# Patient Record
Sex: Male | Born: 1945 | Race: White | Hispanic: No | Marital: Married | Smoking: Never smoker
Health system: Southern US, Community
[De-identification: ages and names within clinical notes are randomized; demographics above are authoritative.]

## PROBLEM LIST (undated history)

## (undated) DIAGNOSIS — I739 Peripheral vascular disease, unspecified: Secondary | ICD-10-CM

## (undated) DIAGNOSIS — R9430 Abnormal result of cardiovascular function study, unspecified: Secondary | ICD-10-CM

## (undated) DIAGNOSIS — I619 Nontraumatic intracerebral hemorrhage, unspecified: Secondary | ICD-10-CM

## (undated) DIAGNOSIS — I2510 Atherosclerotic heart disease of native coronary artery without angina pectoris: Secondary | ICD-10-CM

## (undated) DIAGNOSIS — G459 Transient cerebral ischemic attack, unspecified: Secondary | ICD-10-CM

## (undated) DIAGNOSIS — K219 Gastro-esophageal reflux disease without esophagitis: Secondary | ICD-10-CM

## (undated) DIAGNOSIS — Z951 Presence of aortocoronary bypass graft: Secondary | ICD-10-CM

## (undated) DIAGNOSIS — I10 Essential (primary) hypertension: Secondary | ICD-10-CM

## (undated) DIAGNOSIS — I779 Disorder of arteries and arterioles, unspecified: Secondary | ICD-10-CM

## (undated) DIAGNOSIS — E7800 Pure hypercholesterolemia, unspecified: Secondary | ICD-10-CM

## (undated) HISTORY — DX: Presence of aortocoronary bypass graft: Z95.1

## (undated) HISTORY — DX: Peripheral vascular disease, unspecified: I73.9

## (undated) HISTORY — DX: Pure hypercholesterolemia, unspecified: E78.00

## (undated) HISTORY — DX: Nontraumatic intracerebral hemorrhage, unspecified: I61.9

## (undated) HISTORY — DX: Abnormal result of cardiovascular function study, unspecified: R94.30

## (undated) HISTORY — DX: Essential (primary) hypertension: I10

## (undated) HISTORY — DX: Atherosclerotic heart disease of native coronary artery without angina pectoris: I25.10

## (undated) HISTORY — DX: Transient cerebral ischemic attack, unspecified: G45.9

## (undated) HISTORY — DX: Disorder of arteries and arterioles, unspecified: I77.9

## (undated) HISTORY — DX: Gastro-esophageal reflux disease without esophagitis: K21.9

---

## 1998-12-03 HISTORY — PX: CORONARY ARTERY BYPASS GRAFT: SHX141

## 1999-03-10 ENCOUNTER — Encounter: Payer: Self-pay | Admitting: Emergency Medicine

## 1999-03-10 ENCOUNTER — Inpatient Hospital Stay (HOSPITAL_COMMUNITY): Admission: EM | Admit: 1999-03-10 | Discharge: 1999-03-13 | Payer: Self-pay | Admitting: Emergency Medicine

## 1999-04-19 ENCOUNTER — Encounter: Payer: Self-pay | Admitting: Surgery

## 1999-04-21 ENCOUNTER — Inpatient Hospital Stay (HOSPITAL_COMMUNITY): Admission: RE | Admit: 1999-04-21 | Discharge: 1999-04-25 | Payer: Self-pay | Admitting: Surgery

## 1999-04-21 ENCOUNTER — Encounter: Payer: Self-pay | Admitting: Surgery

## 1999-04-22 ENCOUNTER — Encounter: Payer: Self-pay | Admitting: Surgery

## 1999-04-23 ENCOUNTER — Encounter: Payer: Self-pay | Admitting: Surgery

## 1999-05-30 ENCOUNTER — Encounter (HOSPITAL_COMMUNITY): Admission: RE | Admit: 1999-05-30 | Discharge: 1999-08-28 | Payer: Self-pay | Admitting: Cardiology

## 2002-02-08 ENCOUNTER — Inpatient Hospital Stay (HOSPITAL_COMMUNITY): Admission: EM | Admit: 2002-02-08 | Discharge: 2002-02-09 | Payer: Self-pay | Admitting: Emergency Medicine

## 2002-02-08 ENCOUNTER — Encounter: Payer: Self-pay | Admitting: Emergency Medicine

## 2002-02-08 ENCOUNTER — Encounter (INDEPENDENT_AMBULATORY_CARE_PROVIDER_SITE_OTHER): Payer: Self-pay | Admitting: *Deleted

## 2003-05-18 ENCOUNTER — Encounter: Payer: Self-pay | Admitting: Cardiology

## 2003-05-18 ENCOUNTER — Encounter: Payer: Self-pay | Admitting: Emergency Medicine

## 2003-05-18 ENCOUNTER — Encounter: Payer: Self-pay | Admitting: Neurology

## 2003-05-18 ENCOUNTER — Inpatient Hospital Stay (HOSPITAL_COMMUNITY): Admission: EM | Admit: 2003-05-18 | Discharge: 2003-05-21 | Payer: Self-pay

## 2003-05-20 ENCOUNTER — Encounter: Payer: Self-pay | Admitting: Neurology

## 2003-06-10 ENCOUNTER — Encounter: Admission: RE | Admit: 2003-06-10 | Discharge: 2003-06-21 | Payer: Self-pay | Admitting: Neurology

## 2004-12-03 DIAGNOSIS — G459 Transient cerebral ischemic attack, unspecified: Secondary | ICD-10-CM

## 2004-12-03 HISTORY — DX: Transient cerebral ischemic attack, unspecified: G45.9

## 2005-02-07 ENCOUNTER — Encounter: Admission: RE | Admit: 2005-02-07 | Discharge: 2005-02-07 | Payer: Self-pay | Admitting: Family Medicine

## 2005-07-11 ENCOUNTER — Inpatient Hospital Stay (HOSPITAL_COMMUNITY): Admission: EM | Admit: 2005-07-11 | Discharge: 2005-07-13 | Payer: Self-pay | Admitting: Emergency Medicine

## 2005-07-12 ENCOUNTER — Ambulatory Visit: Payer: Self-pay | Admitting: Cardiology

## 2005-07-12 ENCOUNTER — Encounter: Payer: Self-pay | Admitting: Cardiology

## 2005-07-18 ENCOUNTER — Encounter: Admission: RE | Admit: 2005-07-18 | Discharge: 2005-10-16 | Payer: Self-pay | Admitting: Neurology

## 2005-07-26 ENCOUNTER — Ambulatory Visit: Payer: Self-pay

## 2005-11-29 ENCOUNTER — Emergency Department (HOSPITAL_COMMUNITY): Admission: EM | Admit: 2005-11-29 | Discharge: 2005-11-29 | Payer: Self-pay | Admitting: Family Medicine

## 2006-11-20 ENCOUNTER — Encounter: Admission: RE | Admit: 2006-11-20 | Discharge: 2006-11-20 | Payer: Self-pay | Admitting: Family Medicine

## 2007-06-23 ENCOUNTER — Encounter: Payer: Self-pay | Admitting: Urology

## 2007-06-23 ENCOUNTER — Ambulatory Visit (HOSPITAL_BASED_OUTPATIENT_CLINIC_OR_DEPARTMENT_OTHER): Admission: RE | Admit: 2007-06-23 | Discharge: 2007-06-23 | Payer: Self-pay | Admitting: Urology

## 2007-10-02 ENCOUNTER — Ambulatory Visit: Payer: Self-pay | Admitting: Cardiology

## 2007-10-02 ENCOUNTER — Inpatient Hospital Stay (HOSPITAL_COMMUNITY): Admission: EM | Admit: 2007-10-02 | Discharge: 2007-10-05 | Payer: Self-pay | Admitting: Emergency Medicine

## 2007-10-27 ENCOUNTER — Ambulatory Visit: Payer: Self-pay | Admitting: Cardiology

## 2007-10-28 ENCOUNTER — Encounter (HOSPITAL_COMMUNITY): Admission: RE | Admit: 2007-10-28 | Discharge: 2007-12-03 | Payer: Self-pay | Admitting: Cardiology

## 2007-12-09 ENCOUNTER — Encounter (HOSPITAL_COMMUNITY): Admission: RE | Admit: 2007-12-09 | Discharge: 2007-12-19 | Payer: Self-pay | Admitting: Cardiology

## 2007-12-11 ENCOUNTER — Ambulatory Visit: Payer: Self-pay | Admitting: Cardiology

## 2008-01-14 ENCOUNTER — Ambulatory Visit: Payer: Self-pay | Admitting: Cardiology

## 2008-07-28 ENCOUNTER — Ambulatory Visit: Payer: Self-pay | Admitting: Cardiology

## 2008-10-12 ENCOUNTER — Ambulatory Visit: Payer: Self-pay | Admitting: Cardiology

## 2008-10-12 LAB — CONVERTED CEMR LAB
BUN: 16 mg/dL (ref 6–23)
Basophils Absolute: 0.1 10*3/uL (ref 0.0–0.1)
Basophils Relative: 0.9 % (ref 0.0–3.0)
CO2: 28 meq/L (ref 19–32)
Calcium: 9.4 mg/dL (ref 8.4–10.5)
Chloride: 107 meq/L (ref 96–112)
Creatinine, Ser: 1.2 mg/dL (ref 0.4–1.5)
Eosinophils Absolute: 0.2 10*3/uL (ref 0.0–0.7)
Eosinophils Relative: 2.2 % (ref 0.0–5.0)
GFR calc Af Amer: 79 mL/min
GFR calc non Af Amer: 65 mL/min
Glucose, Bld: 85 mg/dL (ref 70–99)
HCT: 47.4 % (ref 39.0–52.0)
Hemoglobin: 16.7 g/dL (ref 13.0–17.0)
INR: 1 (ref 0.8–1.0)
Lymphocytes Relative: 20.6 % (ref 12.0–46.0)
MCHC: 35.3 g/dL (ref 30.0–36.0)
MCV: 90.6 fL (ref 78.0–100.0)
Monocytes Absolute: 0.9 10*3/uL (ref 0.1–1.0)
Monocytes Relative: 11.4 % (ref 3.0–12.0)
Neutro Abs: 5.2 10*3/uL (ref 1.4–7.7)
Neutrophils Relative %: 64.9 % (ref 43.0–77.0)
Platelets: 213 10*3/uL (ref 150–400)
Potassium: 4.2 meq/L (ref 3.5–5.1)
Prothrombin Time: 12.2 s (ref 10.9–13.3)
RBC: 5.23 M/uL (ref 4.22–5.81)
RDW: 12.8 % (ref 11.5–14.6)
Sodium: 142 meq/L (ref 135–145)
WBC: 8 10*3/uL (ref 4.5–10.5)
aPTT: 25.7 s (ref 21.7–29.8)

## 2008-10-14 ENCOUNTER — Inpatient Hospital Stay (HOSPITAL_BASED_OUTPATIENT_CLINIC_OR_DEPARTMENT_OTHER): Admission: RE | Admit: 2008-10-14 | Discharge: 2008-10-14 | Payer: Self-pay | Admitting: Cardiology

## 2008-10-14 ENCOUNTER — Ambulatory Visit: Payer: Self-pay | Admitting: Cardiology

## 2008-10-25 ENCOUNTER — Ambulatory Visit: Payer: Self-pay | Admitting: Cardiology

## 2008-11-11 ENCOUNTER — Ambulatory Visit: Payer: Self-pay

## 2008-11-29 ENCOUNTER — Encounter: Admission: RE | Admit: 2008-11-29 | Discharge: 2008-11-29 | Payer: Self-pay | Admitting: Family Medicine

## 2009-01-18 ENCOUNTER — Ambulatory Visit: Payer: Self-pay | Admitting: Cardiology

## 2009-01-18 LAB — CONVERTED CEMR LAB
ALT: 25 units/L (ref 0–53)
AST: 22 units/L (ref 0–37)
Albumin: 3.7 g/dL (ref 3.5–5.2)
Alkaline Phosphatase: 61 units/L (ref 39–117)
Bilirubin, Direct: 0.1 mg/dL (ref 0.0–0.3)
Cholesterol: 204 mg/dL (ref 0–200)
Direct LDL: 121.3 mg/dL
HDL: 59.1 mg/dL (ref >39.0)
Total Bilirubin: 0.9 mg/dL (ref 0.3–1.2)
Total CHOL/HDL Ratio: 3.5
Total Protein: 6.7 g/dL (ref 6.0–8.3)
Triglycerides: 109 mg/dL (ref 0–149)
VLDL: 22 mg/dL (ref 0–40)

## 2009-05-30 ENCOUNTER — Encounter: Payer: Self-pay | Admitting: Cardiology

## 2009-07-25 ENCOUNTER — Encounter: Payer: Self-pay | Admitting: Cardiology

## 2009-07-26 ENCOUNTER — Ambulatory Visit: Payer: Self-pay | Admitting: Cardiology

## 2009-11-28 ENCOUNTER — Ambulatory Visit: Payer: Self-pay | Admitting: Cardiology

## 2010-01-19 ENCOUNTER — Encounter: Payer: Self-pay | Admitting: Cardiology

## 2010-01-20 ENCOUNTER — Ambulatory Visit: Payer: Self-pay | Admitting: Cardiology

## 2010-05-29 ENCOUNTER — Encounter: Payer: Self-pay | Admitting: Cardiology

## 2010-07-13 ENCOUNTER — Ambulatory Visit: Payer: Self-pay | Admitting: Cardiology

## 2011-01-04 NOTE — Miscellaneous (Signed)
  Clinical Lists Changes  Observations: Added new observation of PAST MED HX: CABG.. 2000../....Marland KitchenMarland KitchenAcute lateral MI with bare-metal stent  circumflex..(10/08)./....Marland KitchenCath 10/14/08.... LIMA to the LAD patent.[name] all vein grafts patent.. OM stent patent....some sluggish distal flow.. Medical therapy  CAD Hypertension. Hypercholesterolemia.zetia to be held 1123 2009 and simvastatin started October 25, 2008 right thalamic intracerebral bleed in 2004.  Despite this, he has tolerated aspirin and Plavix with no difficulty. History of a transient ischemic attack in 2006. GERD LV  low normal..Marland KitchenInferior hypokinesis ..some posterolateral hypokinesis. Carotid artery disease.... Doppler... December, 2010... 0-39% bilateral    (01/19/2010 15:25) Added new observation of PRIMARY MD: Elsworth Soho, MD (01/19/2010 15:25)       Past History:  Past Medical History: CABG.. 2000../....Marland KitchenMarland KitchenAcute lateral MI with bare-metal stent  circumflex..(10/08)./....Marland KitchenCath 10/14/08.... LIMA to the LAD patent.[name] all vein grafts patent.. OM stent patent....some sluggish distal flow.. Medical therapy  CAD Hypertension. Hypercholesterolemia.zetia to be held 1123 2009 and simvastatin started October 25, 2008 right thalamic intracerebral bleed in 2004.  Despite this, he has tolerated aspirin and Plavix with no difficulty. History of a transient ischemic attack in 2006. GERD LV  low normal..Marland KitchenInferior hypokinesis ..some posterolateral hypokinesis. Carotid artery disease.... Doppler... December, 2010... 0-39% bilateral

## 2011-01-04 NOTE — Assessment & Plan Note (Signed)
Summary: PER CHECK OUT/SF   Visit Type:  Follow-up Primary Provider:  L. Lupe Carney, MD  CC:  CAD.  History of Present Illness: Patient is seen for followup of coronary disease.  I saw him last August, 2010.  Is not having any chest pain.  He has not had syncope or presyncope.  He has a new problem today with a recent upper respiratory infection.  He continues to cough with this.  There is mild sputum production.  Current Medications (verified): 1)  Simvastatin 40 Mg Tabs (Simvastatin) .... Take One Tablet By Mouth Daily At Bedtime 2)  Coreg 3.125 Mg Tabs (Carvedilol) .... Take 1 Tablet By Mouth Twice A Day 3)  Hydrochlorothiazide 12.5 Mg Tabs (Hydrochlorothiazide) .... Take 1 Tablet By Mouth Once A  Day 4)  Omeprazole 20 Mg Tbec (Omeprazole) .... Take 1 Capsule By Mouth Once A Day 5)  Plavix 75 Mg Tabs (Clopidogrel Bisulfate) .... Take 1 Tablet By Mouth Once A Day 6)  Diazepam 5 Mg Tabs (Diazepam) .... As Needed 7)  Losartan Potassium 50 Mg Tabs (Losartan Potassium) .... Take One Tablet By Mouth Once Daily. 8)  Aspirin 81 Mg Tbec (Aspirin) .... Take One Tablet By Mouth Daily  Allergies (verified): 1)  ! Morphine  Past History:  Past Medical History: Last updated: 01/19/2010 CABG.. 2000../....Marland KitchenMarland KitchenAcute lateral MI with bare-metal stent  circumflex..(10/08)./....Marland KitchenCath 10/14/08.... LIMA to the LAD patent.[name] all vein grafts patent.. OM stent patent....some sluggish distal flow.. Medical therapy  CAD Hypertension. Hypercholesterolemia.zetia to be held 1123 2009 and simvastatin started October 25, 2008 right thalamic intracerebral bleed in 2004.  Despite this, he has tolerated aspirin and Plavix with no difficulty. History of a transient ischemic attack in 2006. GERD LV  low normal..Marland KitchenInferior hypokinesis ..some posterolateral hypokinesis. Carotid artery disease.... Doppler... December, 2010... 0-39% bilateral    Review of Systems       Patient denies fever, chills, sweats.  He  has had some mild headache with nasal congestion.[name]  He's had no change in hearing or vision.  There is no chest pain.  He does have a cough.  He has no nausea vomiting or urinary symptoms.  All other systems are reviewed and are negative.  Vital Signs:  Patient profile:   65 year old male Height:      68 inches Weight:      189 pounds BMI:     28.84 Pulse rate:   75 / minute BP sitting:   102 / 68  (left arm) Cuff size:   regular  Vitals Entered By: Hardin Negus, RMA (January 20, 2010 8:31 AM)  Physical Exam  General:  patient is stable in general but upper respiratory congestion. Head:  head is atraumatic Eyes:  no xanthelasma. Neck:  no jugular venous distention. Chest Wall:  no chest wall tenderness. Lungs:  lungs reveal some scattered rhonchi. Heart:  cardiac exam reveals an S1-S2.  No clicks or significant murmurs. Abdomen:  abdomen is soft. Msk:  no musculoskeletal deformities. Extremities:  no peripheral edema. Skin:  no skin rashes. Psych:  patient is oriented to person time and place.  Affect is normal.   Impression & Recommendations:  Problem # 1:  * UPPER RESPIRATORY INFECTION This is a new problem today.  I will recommend to him that he use Robitussin with Mucinex and Claritin.  I do feel that a Z-Pak also would be helpful.  Problem # 2:  CAROTID ARTERY DISEASE (ICD-433.10)  His updated medication list for this problem includes:  Plavix 75 Mg Tabs (Clopidogrel bisulfate) .[name]... Take 1 tablet by mouth once a day    Aspirin 81 Mg Tbec (Aspirin) .[name]... Take one tablet by mouth daily Patient has carotid artery disease.  He had a followup Doppler in December, 2010.  This showed mild bilateral disease that is stable.  Followup in the future.  Problem # 3:  HYPERTENSION (ICD-401.9)  His updated medication list for this problem includes:    Coreg 3.125 Mg Tabs (Carvedilol) .[name]... Take 1 tablet by mouth twice a day    Hydrochlorothiazide 12.5 Mg Tabs  (Hydrochlorothiazide) .[name]... Take 1 tablet by mouth once a  day    Losartan Potassium 50 Mg Tabs (Losartan potassium) .[name]... Take one tablet by mouth once daily.    Aspirin 81 Mg Tbec (Aspirin) .[name]... Take one tablet by mouth daily Blood pressure is under good control today.  No change in therapy.  Problem # 4:  CAD (ICD-414.00)  His updated medication list for this problem includes:    Coreg 3.125 Mg Tabs (Carvedilol) .[name]... Take 1 tablet by mouth twice a day    Plavix 75 Mg Tabs (Clopidogrel bisulfate) .[name]... Take 1 tablet by mouth once a day    Aspirin 81 Mg Tbec (Aspirin) .[name]... Take one tablet by mouth daily Coronary disease is stable at this time.  No change in therapy.  I'll see him back in 6 months.  Patient Instructions: 1)  We have sent you in a prescription for a z-pack (antibiotic), we also recommend that you use Robitussin cough medicine along with plain mucinex and plain claritin, avoid the decongestants 2)  Follow up in 6 months Prescriptions: ZITHROMAX 1 GM PACK (AZITHROMYCIN) Take as directed  #1 pack x 0   Entered by:   Meredith Staggers, RN   Authorized by:   Talitha Givens, MD, [facility]   Signed by:   Meredith Staggers, RN on 01/20/2010   Method used:   Electronically to        [facility] [address] [phone]* (retail)       [address]       [place], [place]  [address]       Ph: [phone] or [phone]       Fax: [phone]   RxID:   [id number]

## 2011-01-04 NOTE — Assessment & Plan Note (Signed)
Summary: Russell Hebert   Visit Type:  Follow-up Primary Provider:  L. Lupe Carney, MD  CC:  CAD.  History of Present Illness: The patient is seen for followup of coronary disease.  He is doing well.  He is not having chest pain or shortness of breath.  He is on appropriate medications for his coronary disease.  I saw him last February, 2011.  Current Medications (verified): 1)  Simvastatin 40 Mg Tabs (Simvastatin) .... Take One Tablet By Mouth Daily At Bedtime 2)  Coreg 3.125 Mg Tabs (Carvedilol) .... Take 1 Tablet By Mouth Twice A Day 3)  Hydrochlorothiazide 12.5 Mg Tabs (Hydrochlorothiazide) .... Take 1 Tablet By Mouth Once A  Day 4)  Omeprazole 20 Mg Tbec (Omeprazole) .... Take 1 Capsule By Mouth Once A Day 5)  Plavix 75 Mg Tabs (Clopidogrel Bisulfate) .... Take 1 Tablet By Mouth Once A Day 6)  Diazepam 5 Mg Tabs (Diazepam) .... As Needed 7)  Losartan Potassium 50 Mg Tabs (Losartan Potassium) .... Take One Tablet By Mouth Once Daily. 8)  Aspirin 81 Mg Tbec (Aspirin) .... Take One Tablet By Mouth Daily  Allergies: 1)  ! Morphine  Past History:  Past Medical History: CABG.. 2000../....Russell KitchenMarland KitchenAcute lateral MI with bare-metal stent  circumflex..(10/08)./....Russell KitchenCath 10/14/08.... LIMA to the LAD patent.Russell Hebert all vein grafts patent.. OM stent patent....some sluggish distal flow.. Medical therapy  CAD Hypertension. Hypercholesterolemia.zetia to be held 1123 2009 and simvastatin started October 25, 2008 right thalamic intracerebral bleed in 2004.  Despite this, he has tolerated aspirin and Plavix with no difficulty. History of a transient ischemic attack in 2006. GERD LV  low normal..Russell KitchenInferior hypokinesis ..some posterolateral hypokinesis. Carotid artery disease.... Doppler... December, 2010... 0-39% bilateral    Review of Systems       Patient denies fever, chills, headache, sweats, rash, change in vision, change in hearing, chest pain, cough, nausea vomiting, urinary symptoms.  All other systems  are reviewed and are negative.  Vital Signs:  Patient profile:   65 year old male Height:      68 inches Weight:      189 pounds BMI:     28.84 Pulse rate:   72 / minute BP sitting:   130 / 78  (left arm)  Vitals Entered By: Laurance Flatten CMA (July 13, 2010 8:27 AM)  Physical Exam  General:  patient is stable. Eyes:  no xanthelasma. Neck:  no jugular venous distention. Lungs:  lungs are clear.  The liver is not labored. Heart:  cardiac exam reveals S1-S2.  No clicks or significant murmurs. Abdomen:  abdomen is soft. Extremities:  no peripheral edema. Psych:  patient is oriented to person time and place.  Affect is normal.   Impression & Recommendations:  Problem # 1:  CAROTID ARTERY DISEASE (ICD-433.10)  His updated medication list for this problem includes:    Plavix 75 Mg Tabs (Clopidogrel bisulfate) .Russell Hebert... Take 1 tablet by mouth once a day    Aspirin 81 Mg Tbec (Aspirin) .Russell Hebert... Take one tablet by mouth daily The patient needs followup Dopplers when appropriate.  Problem # 2:  DYSLIPIDEMIA (ICD-272.4)  His updated medication list for this problem includes:    Simvastatin 40 Mg Tabs (Simvastatin) .Russell Hebert... Take one tablet by mouth daily at bedtime I have reviewed recent labs.  I would like for his LDL to be pushed lower.  However I've chosen not to change his simvastatin 40.  In the future we may consider change to Lipitor when it becomes generic.  Problem #  3:  CAD (ICD-414.00)  His updated medication list for this problem includes:    Coreg 3.125 Mg Tabs (Carvedilol) .Russell Hebert... Take 1 tablet by mouth twice a day    Plavix 75 Mg Tabs (Clopidogrel bisulfate) .Russell Hebert... Take 1 tablet by mouth once a day    Aspirin 81 Mg Tbec (Aspirin) .Russell Hebert... Take one tablet by mouth daily Coronary disease is stable.  EKG is done today and reviewed by me.  It is normal.  No significant workup needed.  We'll see him back in 9 months.  Other Orders: EKG w/ Interpretation (93000)  Patient  Instructions: 1)  Your physician wants you to follow-up in:  9 months.  You will receive a reminder letter in the mail two months in advance. If you don't receive a letter, please call our office to schedule the follow-up appointment.

## 2011-01-04 NOTE — Miscellaneous (Signed)
  Clinical Lists Changes  Problems: Added new problem of CAD (ICD-414.00) Added new problem of CORONARY ARTERY BYPASS GRAFT, HX OF (ICD-V45.81) Added new problem of HYPERTENSION (ICD-401.9) Added new problem of DYSLIPIDEMIA (ICD-272.4) Observations: Added new observation of PAST MED HX: CABG.. 2000.[name]/..October 2008,  acute lateral MI with bare-metal stent  circumflex./..October 14, 2008.[name] LIMA to the LAD patent.[name] all vein grafts patent.. OM stent patent....some sluggish distal flow.. Medical therapy  CAD Hypertension. Hypercholesterolemia.zetia to be held 1123 2009 and simvastatin started October 25, 2008 right thalamic intracerebral bleed in 2004.  Despite this, he has tolerated aspirin and Plavix with no difficulty. History of a transient ischemic attack in 2006. GERD LV  low normal..Marland KitchenInferior hypokinesis ..some posterolateral hypokinesis.    (07/25/2009 15:09) Added new observation of PRIMARY MD: Elsworth Soho, MD (07/25/2009 15:09)       Past History:  Past Medical History: CABG.. 2000.[name]/..October 2008,  acute lateral MI with bare-metal stent  circumflex./..October 14, 2008.[name] LIMA to the LAD patent.[name] all vein grafts patent.. OM stent patent....some sluggish distal flow.. Medical therapy  CAD Hypertension. Hypercholesterolemia.zetia to be held 1123 2009 and simvastatin started October 25, 2008 right thalamic intracerebral bleed in 2004.  Despite this, he has tolerated aspirin and Plavix with no difficulty. History of a transient ischemic attack in 2006. GERD LV  low normal..Marland KitchenInferior hypokinesis ..some posterolateral hypokinesis.

## 2011-04-14 ENCOUNTER — Encounter: Payer: Self-pay | Admitting: *Deleted

## 2011-04-16 ENCOUNTER — Encounter: Payer: Self-pay | Admitting: Cardiology

## 2011-04-16 DIAGNOSIS — E7800 Pure hypercholesterolemia, unspecified: Secondary | ICD-10-CM | POA: Insufficient documentation

## 2011-04-16 DIAGNOSIS — I739 Peripheral vascular disease, unspecified: Secondary | ICD-10-CM

## 2011-04-16 DIAGNOSIS — K219 Gastro-esophageal reflux disease without esophagitis: Secondary | ICD-10-CM | POA: Insufficient documentation

## 2011-04-16 DIAGNOSIS — I619 Nontraumatic intracerebral hemorrhage, unspecified: Secondary | ICD-10-CM | POA: Insufficient documentation

## 2011-04-16 DIAGNOSIS — I2510 Atherosclerotic heart disease of native coronary artery without angina pectoris: Secondary | ICD-10-CM | POA: Insufficient documentation

## 2011-04-16 DIAGNOSIS — I10 Essential (primary) hypertension: Secondary | ICD-10-CM | POA: Insufficient documentation

## 2011-04-16 DIAGNOSIS — I779 Disorder of arteries and arterioles, unspecified: Secondary | ICD-10-CM | POA: Insufficient documentation

## 2011-04-16 DIAGNOSIS — Z951 Presence of aortocoronary bypass graft: Secondary | ICD-10-CM | POA: Insufficient documentation

## 2011-04-16 DIAGNOSIS — G459 Transient cerebral ischemic attack, unspecified: Secondary | ICD-10-CM | POA: Insufficient documentation

## 2011-04-17 ENCOUNTER — Ambulatory Visit (INDEPENDENT_AMBULATORY_CARE_PROVIDER_SITE_OTHER): Payer: Medicare PPO | Admitting: Cardiology

## 2011-04-17 ENCOUNTER — Encounter: Payer: Self-pay | Admitting: Cardiology

## 2011-04-17 DIAGNOSIS — I779 Disorder of arteries and arterioles, unspecified: Secondary | ICD-10-CM

## 2011-04-17 DIAGNOSIS — E7800 Pure hypercholesterolemia, unspecified: Secondary | ICD-10-CM

## 2011-04-17 DIAGNOSIS — I10 Essential (primary) hypertension: Secondary | ICD-10-CM

## 2011-04-17 DIAGNOSIS — I2510 Atherosclerotic heart disease of native coronary artery without angina pectoris: Secondary | ICD-10-CM

## 2011-04-17 NOTE — Assessment & Plan Note (Signed)
Patient had mild carotid disease in the past.  Follow up study will be done in approximately 2 year range.  I will see him back for cardiology followup in one year.

## 2011-04-17 NOTE — Progress Notes (Signed)
HPI Patient returns today for followup of coronary artery disease.  I saw him last August, 2011.  He underwent CABG in 2000.  He had an acute lateral MI with a bare-metal stent in October, 2008.  With some symptoms he had repeat catheterization in November, 2009.  LIMA and vein grafts were patent.  OM stent was patent but there was some sluggish distal flow.  Medical therapy was recommended.  On the basis of the sluggish distal flow I have decided to keep him on Plavix long-term.  I have discussed this with him and he understands and agrees Allergies  Allergen Reactions  . Morphine     Current Outpatient Prescriptions  Medication Sig Dispense Refill  . aspirin 81 MG tablet Take 81 mg by mouth daily.        . carvedilol (COREG) 3.125 MG tablet Take 3.125 mg by mouth 2 (two) times daily with a meal.        . clopidogrel (PLAVIX) 75 MG tablet Take 75 mg by mouth daily.        . diazepam (VALIUM) 5 MG tablet Take 5 mg by mouth every 6 (six) hours as needed.        . hydrochlorothiazide (HYDRODIURIL) 12.5 MG tablet Take 12.5 mg by mouth daily.        [name] losartan (COZAAR) 50 MG tablet Take 50 mg by mouth daily.        [name] omeprazole (PRILOSEC) 20 MG capsule Take 20 mg by mouth daily.        . simvastatin (ZOCOR) 40 MG tablet Take 40 mg by mouth at bedtime.          History   Social History  . Marital Status: Married    Spouse Name: N/A    Number of Children: N/A  . Years of Education: N/A   Occupational History  . retired     Sport and exercise psychologist   Social History Main Topics  . Smoking status: Never Smoker   . Smokeless tobacco: Not on file  . Alcohol Use: No  . Drug Use: No  . Sexually Active: Not on file   Other Topics Concern  . Not on file   Social History Narrative  . No narrative on file    No family history on file.  Past Medical History  Diagnosis Date  . Coronary artery disease     Acute lateral MI October, 2008, bare-metal stent circumflex /  catheterization  November, 2009, LIMA-LAD patent,, all vein grafts patent OM stent patent... some sluggish distal flow... medical therapy  . Hypertension   . Hypercholesterolemia     Simvastatin started 2009  . Intracerebral bleed     right thalamic, 2004, despite this tolerates aspirin and Plavix  . TIA (transient ischemic attack) 2006  . GERD (gastroesophageal reflux disease)   . Hx of CABG     2000  . Ejection fraction     Low normal, inferior hypokinesis with some posterior lateral hypokinesis  . Carotid artery disease     Doppler, December, 2010, 0-39% bilateral    Past Surgical History  Procedure Date  . Coronary artery bypass graft 2000    Acute lateral MI and bare-metal stent circumflex..(10/08) Cath 10/14/08; LIMA to the LAD patent ; all vein grafts patent; OM stent patent..some sluggish distal flow.    ROS  Patient denies fever, chills, headache, sweats, rash, change in vision, change in hearing, chest pain, cough, nausea vomiting, urinary symptoms.  All other  systems are reviewed and are negative.  PHYSICAL EXAM Patient is oriented to person time and place.  Affect is normal.  Head is atraumatic.  Eyes no xanthelasma.  Lungs are clear.  Respiratory effort is nonlabored.  Cardiac exam reveals S1 and S2.  No clicks or significant murmurs.  There is no jugular venous distention.  There is no peripheral edema.  No musculoskeletal deformities.  There are no skin rashes.  His abdomen is soft. Filed Vitals:   04/17/11 0930  BP: 122/84  Pulse: 73  Resp: 18  Height: 5\' 8"  (1.727 m)  Weight: 203 lb 1.9 oz (92.135 kg)    EKG done today and reviewed by me.  There is normal sinus rhythm.  There is slight prolongation of the QT interval.  There is no significant change.  ASSESSMENT & PLAN

## 2011-04-17 NOTE — Discharge Summary (Signed)
Russell Hebert, Russell Hebert             ACCOUNT NO.:  [id number]   MEDICAL RECORD NO.:  [id number]          PATIENT TYPE:  INP   LOCATION:  2030                         FACILITY:  [facility]   PHYSICIAN:  Learta Codding, MD,FACC DATE OF BIRTH:  1945-12-18   DATE OF ADMISSION:  10/02/2007  DATE OF DISCHARGE:  10/05/2007                               DISCHARGE SUMMARY   PRIMARY CARE PHYSICIAN:  L. Lupe Carney, M.D.   CARDIOLOGIST:  Luis Abed, MD, [facility].   DISCHARGING DIAGNOSES:  1. Coronary artery disease/ST-elevated myocardial infarction this      admission, status post placement of a bare metal stent to the      circumflex, ejection fraction near normal, patent grafts, with no      critical distal disease.  2. Dyslipidemia.   PAST MEDICAL HISTORY:  1. Coronary artery disease, status post CABG in 2000.  2. Hypertension.  3. Hypercholesterolemia.  4. Cerebrovascular disease/right thalamic intracerebral hemorrhage in      2004/TIA in 2006.  5. GERD.   HOSPITAL COURSE:  Mr. Blakely is a 65 year old gentleman with known  history of coronary artery disease, status post CABG in the past, seen  this admission by Dr. Antoine Poche secondary to chest discomfort.  The  patient called EMS from home.  EKG:  ST elevation noted.  The patient  was treated with aspirin.  The patient was taken urgently to the  catheterization lab.  Intervention by Dr. Shawnie Russell Hebert on October 02, 2007 secondary to acute lateral MI.  The patient had placement of a bare  metal stent, with successful reperfusion, patent SVGs to the diagonal,  OM, and PD.  Stent placement done secondary to occlusion of OM2.  The  patient tolerated the procedure without complications.  Dr. Andee Lineman in to  see patient on day of discharge.  Blood pressure stable 119/81.  Patient  afebrile.  Hemoglobin 16.9, hematocrit 43.3.  BUN and creatinine 18 and  1.09, potassium 3.7.  Patient being discharged home, to follow up with  Dr. Myrtis Ser  outpatient.  Office will call the patient by phone tomorrow  morning (Monday) for office visit date and time.  The patient has been  given the post-cardiac catheterization discharge instructions.   He is being discharged home on:  1. Aspirin 325.  2. Plavix 75 daily.  3. Zetia 10.  4. Pravastatin 40.  5. HCTZ 12.5.  6. Coreg 6.25 b.i.d.  7. Nitroglycerin p.r.n.   He has been given prescriptions for all the medications listed above  except for the aspirin.  He knows to call our office for any problems  from catheterization site.   DURATION OF DISCHARGE ENCOUNTER:  Greater than 30 minutes.      Dorian Pod, ACNP      Learta Codding, MD,FACC  Electronically Signed    MB/MEDQ  D:  10/05/2007  T:  10/06/2007  Job:  [id number]

## 2011-04-17 NOTE — Assessment & Plan Note (Signed)
Blood pressure is controlled. No change in therapy. 

## 2011-04-17 NOTE — Cardiovascular Report (Signed)
Russell Hebert, Russell Hebert             ACCOUNT NO.:  [id number]   MEDICAL RECORD NO.:  [id number]          PATIENT TYPE:  OIB   LOCATION:  NA                           FACILITY:  [facility]   PHYSICIAN:  Rollene Rotunda, MD, FACCDATE OF BIRTH:  [birth year]   DATE OF PROCEDURE:  10/14/2008  DATE OF DISCHARGE:                            CARDIAC CATHETERIZATION   PRIMARY CARE PHYSICIAN:  L. Lupe Carney, MD   CARDIOLOGIST:  Luis Abed, MD, [facility]   PROCEDURE:  Left and right heart catheterization/coronary arteriography  with injection of vein grafts in LIMA.   PROCEDURE NOTE:  Left heart catheterization performed via the right  femoral artery.  The artery was cannulated using anterior wall puncture.  A #4-French arterial sheath was inserted via the modified Seldinger  technique.  A preformed Judkins, pigtail, and a multipurpose catheter  were utilized.  The patient tolerated the procedure well and left the  lab in stable condition.   RESULTS:  Hemodynamics:  LV 107/18, AL 102/84.   Coronaries:  Left main had 40% stenosis.  The LAD had long proximal  diffuse 50-60% stenosis.  It was occluded in the mid segment.  The  distal vessel wrapping the apex filled via the LIMA.  However, it was a  diffusely diseased vessel with nonobstructive disease and small in  caliber.  First diagonal was small with aneurysmal dilatation.  Second  diagonal was small and branching and filled via vein graft.  It was free  of disease.   Circumflex:  The circumflex in the AV groove had long proximal 30%  stenosis.  There was a mid stent into a large mid-obtuse marginal, which  was widely patent.  There were diffuse 25% lesions within the stent.  First obtuse marginal had ostial subtotal stenosis versus tiny vessel  filling via the vein graft.  The second obtuse marginal was moderate and  after the stent, there was some diffuse luminal irregularities, but it  was free of high-grade disease.  The right coronary  artery was a  dominant vessel.  There was a long proximal 50-60% stenosis followed by  total occlusion in mid stented area.  The distal vessel filled via vein  graft.  The PDA and posterolaterals were free of high-grade obstruction.   Grafts:  LIMA to the LAD was widely patent.  Saphenous vein graft with a  tiny diagonal was widely patent.  Saphenous vein graft to the obtuse  marginal, again a tiny vessel was patent.  Saphenous vein graft to the  PDA was patent.  All of the saphenous vein grafts had sluggish flow into  small vessels.   Left ventriculogram:  Left ventriculogram was obtained in the RAO  projection.  The EF is 55% with mild inferior hypokinesis.   CONCLUSION:  Severe native three-vessel coronary artery disease with  patent grafts.  There is a patent stent to the OM, which was put in by  Dr. Riley Kill last year.  The ejection fraction is preserved.   PLAN:  Based on the above, the patient will continue to have aggressive  secondary risk reduction and medical management.  Rollene Rotunda, MD, [facility]  Electronically Signed     JH/MEDQ  D:  10/14/2008  T:  10/14/2008  Job:  [id number]   cc:   L. Lupe Carney, M.D.  Luis Abed, MD, [facility]

## 2011-04-17 NOTE — Assessment & Plan Note (Signed)
Coronary disease is stable.  No further workup is needed.  The plan for now is to keep him on aspirin and Plavix.

## 2011-04-17 NOTE — Assessment & Plan Note (Signed)
Lipids are being treated. No change in therapy. 

## 2011-04-17 NOTE — H&P (Signed)
Russell Hebert, Russell Hebert NO.:  [id number]   MEDICAL RECORD NO.:  [id number]          PATIENT TYPE:  INP   LOCATION:  2906                         FACILITY:  [facility]   PHYSICIAN:  Rollene Rotunda, MD, FACCDATE OF BIRTH:  January 21, 1946   DATE OF ADMISSION:  10/02/2007  DATE OF DISCHARGE:                              HISTORY & PHYSICAL   PRIMARY CARE PHYSICIAN:  Dr. Lupe Carney.  Primary cardiologist, Dr.  Willa Rough.   CHIEF COMPLAINT:  Chest pain.   HISTORY OF PRESENT ILLNESS:  Russell Hebert is a 65 year old male with a  previous history of bypass surgery in 2000, but no recent cardiac  followup.  He developed chest pain at approximately 9:30 a.m. at home.  He called EMS and an EKG at 9:47 a.m., shows lateral ST elevation  approximately 1-1-1/2 mm.  He was treated with aspirin.  At 10:17 a.m.,  in the emergency room, his EKG was significantly abnormal also and he  was taken directly to the catheterization lab.  Prior to the onset of  symptoms today, he states he has not had any recent chest pain.  At this  time, he is in acute distress and also short of breath secondary to the  pain.   PAST MEDICAL HISTORY:  1. 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  2. Hypertension.  3. Hyperlipidemia.  4. History of TIA in 2000.  5. Family history of coronary artery disease.  6. History of preserved left ventricular function with an EF of 65% by      echocardiogram in 2006.  7. 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  8. History of a right thalamic hemorrhagic CVA in 2004.  9. Gastroesophageal reflux disease.   ALLERGIES:  He is reportedly allergic to MORPHINE.   MEDICATIONS:  1. Hydrochlorothiazide 25 mg daily.  2. Viagra 100 mg 1/2 to 1 tablet p.r.n.  3. Pravastatin 40 mg daily.  4. Ibuprofen 800 mg p.r.n.  5. Zetia 10 mg daily.  6. Sular 20 mg (bottle empty).   SOCIAL HISTORY:  He  lives in [place] with his wife.  He is believed  to be employed at [employer].  He has no history of alcohol  abuse, tobacco or drug use.  He does not exercise regularly.   FAMILY HISTORY:  His mother died at age 16 with coronary artery disease  and COPD.  Father died at age 70 with a history of CHF and an aneurysm.  A twin brother has had an MI.   REVIEW OF SYSTEMS:  Significant for chest pain as described above.  Review of systems is limited secondary to the acuity of the situation,  but he denies any recent fevers or chills.  He denies hemoptysis or  hematuria.  He has been recovering fairly well for hernia surgery in  July.  Review of systems is otherwise negative.   PHYSICAL EXAMINATION:  VITAL SIGNS:  Blood pressure 156/97, heart rate  72, respiratory rate 22, O2 saturation  100% on a non-rebreather.  GENERAL:  He is a well-developed, well-nourished, white male in acute  distress.  HEENT:  Unremarkable.  NECK:  There is no bruits.  No JVD, no thyromegaly is noted.  There is  no lymphadenopathy noted.  LUNGS:  Essentially clear to auscultation bilaterally.  CARDIAC:  Heart is regular in rate and rhythm with no rubs, murmurs or  gallops noted.  ABDOMEN:  Soft and nontender with active bowel sounds.  EXTREMITIES:  He has 2+ distal pulses.  There is no edema.  There is no  joint deformity or effusions noted.  NEUROLOGIC:  He is alert and oriented.  Cranial nerves 2-12 grossly  intact.   LABORATORY DATA AND X-RAY FINDINGS:  EKG shows high lateral ST elevation  in V5 and V6.  There is reciprocal ST depression in I and aVL.   Laboratory values and chest x-ray pending at the time of dictation.   IMPRESSION/PLAN:  He has an acute lateral myocardial infarction.  He is  being taken urgently to the catheterization lab for further evaluation  and treatment.  He will be continued on his home medications.  We will  attempt to add a beta-blocker to his medication regimen.  We  will check  a lipid profile with further evaluation and treatment depending on the  results of the above testing.      Theodore Demark, PA-C      Rollene Rotunda, MD, [facility]  Electronically Signed    RB/MEDQ  D:  10/02/2007  T:  10/03/2007  Job:  [id number]   cc:   L. Lupe Carney, M.D.

## 2011-04-17 NOTE — Assessment & Plan Note (Signed)
[facility]                            CARDIOLOGY OFFICE NOTE   NATHAN, MOCTEZUMA                      MRN:          [id number]  DATE:01/14/2008                            DOB:          September 08, 1946    Russell Hebert is doing well.  He received a bare metal stent in October  2008 to his circumflex.  Were planning on Plavix for approximately 6  months.  We will cut his aspirin down 325 to 81 today.  He is stable  overall.  He feels somewhat better since I lowered his Coreg dose.  He  is having symptoms of reflux and of and I talked to him about measures  to be careful with this.  I encouraged him to put blocks under his bed,  and to not eat before going to bed and to try to lose some weight.  I do  not think his symptoms is ischemic.  He has had significant reflux in  the past.   PAST MEDICAL HISTORY:   ALLERGIES:  MORPHINE.   MEDICATIONS:  Aspirin 325 (to be changed to 81),  Plavix 75, Zetia 10,  pravastatin 40, ramipril 2.5, and carvedilol 3.125 b.i.d.   OTHER:  Medical problems see the list below.   REVIEW OF SYSTEMS:  Other than the HPI his review of systems is  negative.   PHYSICAL EXAM:  Weight is 199 pounds which is decreased 2 pounds since I  saw him last.  Blood pressure is 116/89 with a pulse of 81.  The patient is oriented to person, time and place.  Affect is normal.  HEENT:  Reveals no xanthelasma.  He has normal extraocular motion.  There are no carotid bruits.  There is no jugular venous distention.  Lungs are clear.  Respiratory effort is not labored.  Cardiac exam reveals S1-S2.  There are no clicks or significant murmurs.  The abdomen is soft.  He has no peripheral edema.  There are no major musculoskeletal  deformities.   EKG is done to be sure that there has not been any significant change as  he is having the chest symptoms.  There is no significant change.   PROBLEMS:  1. History of CABG in 2000 and his grafts were  patent in October 2008.  2. Hypertension.  Blood pressure is stable on his current meds.  3. Hypercholesterolemia treated.  4. History of right thalamic intracerebral bleed hemorrhage in 2004.  5. History of a TIA in 2006.  6. History of gastroesophageal reflux disease.  7. Status post bare metal stent in October 2008 to the circumflex.  8. Borderline normal LV function with some inferior hypokinesis and      question of some poster lateral hypokinesis.   Russell Hebert cardiac status appears to be stable at this point.  I will  not change his cardiac meds.  I will encourage him to take omeprazole on  a regular basis before his meals for a period of time and to be careful  with the reflux measures outlined above.     Russell Abed, MD, [facility]  Electronically Signed    JDK/MedQ  DD: 01/14/2008  DT: 01/15/2008  Job #: [id number]   cc:   L. Lupe Carney, M.D.

## 2011-04-17 NOTE — Op Note (Signed)
Russell Hebert, Russell Hebert             ACCOUNT NO.:  [id number]   MEDICAL RECORD NO.:  [id number]          PATIENT TYPE:  AMB   LOCATION:  [facility]                         FACILITY:  [facility]   PHYSICIAN:  Bertram Millard. Dahlstedt, M.D.DATE OF BIRTH:  1946/05/31   DATE OF PROCEDURE:  06/23/2007  DATE OF DISCHARGE:                               OPERATIVE REPORT   PREOPERATIVE DIAGNOSIS:  Right scrotal mass.   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   PRINCIPAL PROCEDURES:  Right inguinal exploration of testicle, right  radical orchiectomy.   SURGEON:  Bertram Millard. Dahlstedt, M.D.   ANESTHESIA:  General with LMA.   COMPLICATIONS:  None.   ESTIMATED BLOOD LOSS:  Minimal.   SPECIMEN:  Right testicle and cord to pathology.   BRIEF HISTORY:  A 65 year old male who had a right inguinal hernia  repair last year.  Postoperatively, he was seen noted to have a 3-4 cm  right testicular mass.  The patient was unsure about whether this  existed preop, but he did have a large inguinal hernia which more than  likely would have made it hard to palpate anything within the scrotum.  He first was seen by me earlier this year.  Follow-up was not provided  until a month or so ago.  Evaluation revealed a large right scrotal mass  without significant internal blood flow.  It was attached to the cord.  He had a small testicle on that side.   It was recommended that we explore this patient's right hemiscrotum, as  a malignancy needed to be ruled out.  Risks and complications of right  scrotal exploration and possible inguinal orchiectomy were discussed  with the patient.  These include, but are not limited to, infection,  bleeding, loss of the testicle, among others.  He understands these and  desires to proceed.   DESCRIPTION OF PROCEDURE:  The patient was identified in the holding  area, taken to the operating room after the surgical side was  marked.  He was placed in the recumbent position on the OR table.  General  anesthetic was administered using the LMA.  Genitalia and perineum as  well as lower abdomen were prepped and draped.  A 3.5 cm right inguinal  incision was then carried out over top of his external inguinal ring.  It was carried down through subcutaneous tissue with electrocautery.  The cord was identified.  Using pressure from the scrotum, the top of  the mass was evident in the incision.  The attachments between the  scrotal wall and the mass were divided with sharp dissection.  It was  evident that this mass was firmly attached to the testicle which was  quite atrophic and only about 1-1.5 cm in length.  The epididymal  structures appeared normal.  It was quite difficult to dissect this mass  free from the scrotum.  However, using attention to detail and making  sure we did not buttonhole the scrotal skin, this mass was eventually  dissected free from the scrotum.  This mass was attached to the  spermatic  cord.  Once the mass was freed from the scrotum and the  testicle and cord were fully dissected from the scrotum, dissection was  carried up the cord to the inguinal ring.  Small attachments were  dissected with electrocautery.  Once we had the cord and all cord  contents isolated out of the scrotum, I then cut into the mass.  It was  evident that there was brownish tissue which was either old pus or an  old hematoma.  There was no active bleeding within this.  The wall was  somewhat thickened but fairly regular.  There was no evidence of tumor  in this area.  I thought that at this point, this mass was just a large  organized hematoma which had failed to reabsorb or an old burned-out  abscess.  It was evident that I would be unable to separate this from  the cord as it was quite densely attached.  Since the testicle was very  atrophic, I felt at this point it would be worthwhile just ago had and  remove the  testicle with a radical orchiectomy.  The cord was doubly  clamped proximally.  Proximal to this, it was infiltrated with 10 mL of  0.25% plain Marcaine.  The cord was ligated in two sections with 0  Vicryl ties.  The cord and testicular contents were sent for pathology.  It was cultured, both anaerobic and aerobic.  The scrotum was inverted.  No bleeding was seen  after small bleeders were electrocoagulated.  At  this point, the wound was copiously irrigated with bug.  A 2-0 Vicryl  was used to reapproximate the subcutaneous tissues.  The skin was  reapproximated using a running subcuticular suture of 4-0 Vicryl.  Benzoin, Steri-Strips, Telfa and Tegaderm were placed.  Scrotal support  and fluffs were placed.   The patient tolerated the procedure well.  There was minimal blood loss.  He was taken to PACU in stable condition.  Follow-up will be provided  July 08, 2007, at 9 o'clock a.m.  He is discharged on Vicodin.  We  will await culture results.      Bertram Millard. Dahlstedt, M.D.  Electronically Signed     SMD/MEDQ  D:  06/23/2007  T:  06/23/2007  Job:  [id number]

## 2011-04-17 NOTE — Assessment & Plan Note (Signed)
[facility]                            CARDIOLOGY OFFICE NOTE   TRUMAN, ACEITUNO                      MRN:          [id number]  DATE:01/18/2009                            DOB:          10-25-1946    Mr. Naas returns for followup and he is doing well.  I saw him last  in the office on October 25, 2008.  He had just had cardiac  catheterization after some chest symptoms.  The cath looked good.  There  was one area of some sluggish distal flow.  His grafts were patent.  He  is stable.  He has not had any recurrent chest pain.  Also, we were able  to restart him on a small dose of simvastatin.  We will not check his  lipids.  He had some difficulties with it in the past, but seems to be  tolerating it at this point.   PAST MEDICAL HISTORY:   ALLERGIES:  MORPHINE.   MEDICATIONS:  Plavix, aspirin, hydrochlorothiazide, ramipril,  carvedilol, omeprazole, and simvastatin.   OTHER MEDICAL PROBLEMS:  See the list in the note of October 25, 2008.   REVIEW OF SYSTEMS:  He has no GI or GU symptoms.  He has no fevers,  chills, skin rashes.  There is no headache or eye problems.  There are  no dental problems.  His review of systems is negative.   PHYSICAL EXAMINATION:  VITAL SIGNS:  Blood pressure 104/83 with a pulse  of 76.  GENERAL:  The patient is oriented to person, time, and place.  Affect is  normal.  HEENT:  No xanthelasma.  He has normal extraocular motion.  NECK:  There are no carotid bruits.  There is no jugular venous  distention.  LUNGS:  Clear.  Respiratory effort is not labored.  CARDIAC:  An S1 with an S2.  There are no clicks or significant murmurs.  ABDOMEN:  Soft.  EXTREMITIES:  There is no peripheral edema.   No labs were done today.   Problems are listed on the note of October 25, 2008.  #1.  Coronary artery disease, stable.  #4.  Hypercholesterolemia.  We will now check fasting lipid on 20 of  simvastatin.   He is  stable.  I will see him back in 6 months.     Luis Abed, MD, [facility]  Electronically Signed    JDK/MedQ  DD: 01/18/2009  DT: 01/18/2009  Job #: [id number]   cc:   L. Lupe Carney, M.D.

## 2011-04-17 NOTE — Assessment & Plan Note (Signed)
[facility]                            CARDIOLOGY OFFICE NOTE   Russell Hebert                      MRN:          [id number]  DATE:10/25/2008                            DOB:          [birth year]    I saw Russell Hebert in the office on October 12, 2008.  He had been  traveling and had some chest pain on October 10, 2008.  He came back to  be seen here and we decided to proceed with an outpatient  catheterization.  Cath was done by Dr. Antoine Hebert on October 14, 2008.  Study showed that all of his grafts were patent.  He did have some  sluggish flow into the small vessels after his vein graft.  This may  well be the basis of his symptoms.  He had some inferior hypokinesis and  his ejection fraction was 55%.  Medical therapy was recommended.  Since  that time, he has been quite stable.  He has not had any recurrent chest  pain.  He may have felt less than optimal on Lipitor and Pravachol;  however, he has had no definite reaction.  Today, he asked me about  Zetia.  There have been TV advertisement stating that Zetia may not be  optimal and I have discussed this with him.  I do think that trying  harder to get him on a statin would be optimal.   PAST MEDICAL HISTORY:   ALLERGIES:  MORPHINE.   MEDICATIONS:  Plavix 75, Zetia 10 (to be held), hydrochlorothiazide,  ramipril, carvedilol, omeprazole, aspirin, and simvastatin 20 to be  started.   OTHER MEDICAL PROBLEMS:  See the list below.   REVIEW OF SYSTEMS:  Today, he is feeling well.  He is having no  headaches or skin rashes.  He has no fevers or chills.  There are no GI  or GU symptoms.  His review of systems is negative.   PHYSICAL EXAMINATION:  VITAL SIGNS:  Blood pressure is 126/84.  Pulse is  70.  The patient is oriented to person, time, and place.  Affect is  normal.  HEENT:  No xanthelasma.  He has normal extraocular motion.  No carotid  bruits.  There is no jugular venous distention.  LUNGS:  Clear.  Respiratory effort is not labored.  CARDIAC:  S1 and S2.  There are no clicks or significant murmurs.  ABDOMEN:  Soft.  EXTREMITIES:  There are no peripheral edema.  His right groin cath site  is nicely healed.   PROBLEMS:  1. Coronary artery disease post coronary artery bypass grafting in      2000.  Bare-metal stent.  2. Bare metal stent to the Obtuse marginal placed in 2008.  Very      recent catheterization shows that all of his grafts were opened.      The stent is open.  He has some sluggish distal flow.  Medical      therapy is recommended.  3. Hypertension, treated.  4. Hypercholesterolemia.  We had an extensive discussion about the      approach.  He is willing  to come off Zetia and to try a different      statin.  I will put him on 20 of simvastatin and we will see him      back for early followup.  5. History of a right thalamic intracerebral bleed in 2004.  Despite      this, he can take the aspirin and Plavix.  6. History of transient ischemic attack in 2006.  7. Gastroesophageal reflux disease.  8. Inferior hypokinesis and ejection fraction of 55%.   He is now stable.  We will make these changes in his meds and then I  will see him for followup.     Russell Abed, MD, [facility]  Electronically Signed    JDK/MedQ  DD: 10/25/2008  DT: 10/25/2008  Job #: [id number]   cc:   Russell Hebert, M.D.

## 2011-04-17 NOTE — Assessment & Plan Note (Signed)
[facility]                            CARDIOLOGY OFFICE NOTE   JESTER, KLINGBERG                      MRN:          [id number]  DATE:12/11/2007                            DOB:          12-02-1946    [name] is stable.  He had an MI and underwent bare metal stent  placement on October 02, 2007 to his circumflex.  He has near normal  ejection fraction.  His grafts are patent.  We are planning Plavix for  six months after the event.  He is not having chest pain or shortness of  breath.  His blood pressure had been mildly elevated, and I felt it was  appropriate to add low dose ramipril at the time of his last visit.  He  is tolerating this well.  Overall, however, he feels fatigued, and he  has felt this way since he left the hospital.  It is possible that he is  feeling this from the beta blockade.  We will reduce his Coreg down to  3.125 b.i.d. and see him back for early follow-up and continue to see  how he is feeling and adjust his medications.   ALLERGIES:  MORPHINE.   MEDICATIONS:  1. Aspirin 325.  2. Plavix 75.  3. Zetia 10.  4. Pravastatin 40.  5. Hydrochlorothiazide 12.5.  6. Coreg to be reduced to 3.125 b.i.d.  7. Ramipril 2.5 daily.   OTHER MEDICAL PROBLEMS:  See the list below.   REVIEW OF SYSTEMS:  As mentioned he feels fatigued.  However, he is able  to be active when he wants to be.  Otherwise, Review of Systems is  negative.   PHYSICAL EXAMINATION:  VITAL SIGNS:  Blood pressure 129/92.  GENERAL:  The patient is oriented to person, time, and place.  Affect is  normal.  HEENT:  Reveals no xanthelasma.  He has normal extraocular motion.  NECK:  There are no carotid bruits.  There is no jugular venous  distention.  LUNGS:  Clear.  Respiratory effort is not labored.  CARDIAC:  Reveals S1 with an S2.  There are no clicks or significant  murmurs.  ABDOMEN:  Soft.  EXTREMITIES:  There is no peripheral edema.   LABORATORY  DATA:  No labs are done today.   PROBLEMS:  1. History of CABG in May 2000.  Grafts were patent in October 2008.  2. Hypertension.  Blood pressure is reasonable today.  We will      continue to watch him.  However, we are going to reduce his Coreg      dose slightly, and I will see him for follow-up to see how he feels      and what is occurring with his blood pressure.  I may push his ACE      inhibitor up later, but I have decided not to make two changes as      of today.  3. Hypercholesterolemia treated.  4. History of right thalamic intracerebral hemorrhage 2004.  5. History of a TIA in 2006.  6. History of GERD.  7. Status post  MI with bare metal stent on October 02, 2007 to the      circumflex.  8. LV function borderline normal with minimal inferior hypokinesis and      questionable posterolateral hypokinesis at the time of his      catheterization.   We will adjust his Coreg dose and see him back for follow-up.     Luis Abed, MD, [facility]  Electronically Signed    JDK/MedQ  DD: 12/11/2007  DT: 12/11/2007  Job #: [id number]   cc:   L. Lupe Carney, M.D.

## 2011-04-17 NOTE — Assessment & Plan Note (Signed)
[facility]                            CARDIOLOGY OFFICE NOTE   SIGURD, PUGH                      MRN:          [id number]  DATE:10/12/2008                            DOB:          04/12/1946    [name] is here for cardiology followup.  He is an add-on also,  because he recently had chest pain and was admitted in the hospital in  the [place] area from early on the morning of October 09, 2008 to late  in the afternoon on October 10, 2008.  Mr. Hessel has known coronary  artery disease.  He has post CABG in the past.  He then underwent an  intervention with a bare-metal stent placed to his circumflex for an  acute coronary syndrome in October 2008.  He has done well since then.  Then on the evening of October 08, 2008 and into the morning of October 09, 2008, while out of town, he had nausea, vomiting, and chest pain.  EMS was called and nitroglycerin helped partially and then more  completely in the emergency room.  He was admitted.  We do not have the  records.  However, he gives a good history of being told that there was  no evidence of heart injury.  I assume this means that he had no  enzyme elevation.  They wanted to keep him there for further studies.  He wanted to come home and he is here and stable and has had no  recurrent symptoms since then.   PAST MEDICAL HISTORY:   ALLERGIES:  MORPHINE.   MEDICATIONS:  1. Plavix 75.  2. Zetia 10.  3. Hydrochlorothiazide.  4. Ramipril 2.5.  5. Carvedilol 3.125 b.i.d.  6. Omeprazole.  7. Aspirin 81.   OTHER MEDICAL PROBLEMS:  See the list below.   REVIEW OF SYSTEMS:  He is not having any fevers or chills.  He has no  headaches.  He is not having any eye problems.  He has no shortness of  breath.  He is not having any chest pain currently.  He has no GI or GU  symptoms.  There is no major musculoskeletal symptoms.  He does have  some mild vertigo that is intermittent and stable.   Otherwise, his  review of systems is negative.   PHYSICAL EXAMINATION:  GENERAL:  Today, he looks quite good.  VITAL SIGNS:  Blood pressure is 132/86 with a pulse of 77.  NEUROLOGIC:  The patient is oriented to person, time, and place.  Affect  is normal.  HEENT:  No xanthelasma.  He has normal extraocular motion.  NECK:  There are no carotid bruits.  There is no jugular venous  distention.  LUNGS:  Clear.  Respiratory effort is not labored.  CARDIAC:  An S1 with an S2.  There are no clicks or significant murmurs.  ABDOMEN:  Soft.  He has no peripheral edema.   EKG today reveals sinus rhythm.  He has nonspecific ST-T wave changes.   PROBLEMS:  1. History of coronary artery bypass graft in 2000.  His grafts had  been patent in October of 2008, but he had an acute lateral      myocardial infarction and received a bare-metal stent to his      circumflex.  2. Hypertension.  3. Hypercholesterolemia.  4. History of a right thalamic intracerebral bleed in 2004.  Despite      this, he has tolerated aspirin and Plavix with no difficulty.  5. History of a transient ischemic attack in 2006.  6. History of gastroesophageal reflux disease.  7. Inferior hypokinesis with low normal ejection fraction and some      posterolateral hypokinesis.  8. Recent increase in the chest discomfort with nausea and vomiting.      He was admitted for approximately 24 hours and by his report he had      no myocardial infarction.  We are still trying to obtain the      records.  He is stable at this point.   We do need to proceed with cardiac catheterization.  He is on aspirin  and Plavix.  His blood pressure is well-controlled.  I have chosen not  to push his medications any further at this time.  We will arrange for  catheterization in the next day or two.  We will also give him  nitroglycerin spray to carry.     Luis Abed, MD, [facility]  Electronically Signed    JDK/MedQ  DD: 10/12/2008  DT:  10/13/2008  Job #: [id number]

## 2011-04-17 NOTE — Assessment & Plan Note (Signed)
[facility]                            CARDIOLOGY OFFICE NOTE   Hebert, Russell                      MRN:          [id number]  DATE:07/28/2008                            DOB:          [birth year]    HISTORY OF PRESENT ILLNESS:  Russell Hebert is doing very well.  He  received a bare-metal stent in October 2008 to his circumflex.  He has  done very well.  He is post CABG in 2000.  He is not having any chest  pain.  He has some rare reflux symptoms.  He has stopped lying down  after eating at night and he feels much better and I discussed the  physiology of this to him.  He is going about full activities.   PAST MEDICAL HISTORY:   ALLERGIES:  MORPHINE.   MEDICATIONS:  Plavix, Zetia, hydrochlorothiazide, ramipril, carvedilol,  omeprazole, and aspirin.   OTHER MEDICAL PROBLEMS:  See the list on my note of January 14, 2008.   REVIEW OF SYSTEMS:  He feels fine and his review of systems is negative.   PHYSICAL EXAMINATION:  VITAL SIGNS:  Weight is 204.  He needs to lose  weight.  Blood pressure is 122/84 with a pulse of 73.  GENERAL:  The patient is oriented to person, time, and place.  Affect is  normal.  HEENT:  Reveals no xanthelasma.  He has normal extraocular motion.  NECK:  There are no carotid bruits.  There is no jugular venous  distention.  LUNGS:  Clear.  Respiratory effort is not labored.  CARDIAC:  Exam reveals an S1 with an S2.  There are no clicks or  significant murmurs.  ABDOMEN:  Soft.  He has no peripheral edema.   EKG reveals no diagnostic abnormalities.   PROBLEMS:  Problems are listed on the note of January 14, 2008.  He is  post coronary artery bypass graft and post a bare-metal stent in October  2008.  He is stable.  He is on the appropriate medicines.  For now, we  will plan to keep him on aspirin and Plavix.     Russell Abed, MD, [facility]  Electronically Signed    JDK/MedQ  DD: 07/28/2008  DT: 07/29/2008  Job #:  [id number]   cc:   L. Lupe Carney, M.D.

## 2011-04-17 NOTE — Assessment & Plan Note (Signed)
[facility]                            CARDIOLOGY OFFICE NOTE   BASIM, BARTNIK                      MRN:          [id number]  DATE:10/27/2007                            DOB:          1946-10-24    Mr. Marchant is here after his hospitalization. He has known coronary  disease. I had followed him in the past. I have not seen him since 2001.  He presented with chest pain. He had ST elevation and he was treated  aggressively. He had a bare metal stent placed and occlusion of obtuse  marginal 2. He tolerated the procedure well and he was discharged. He is  taking his medications appropriately. He has not had any recurring chest  pain. The cath site is stable.   PAST MEDICAL HISTORY:   ALLERGIES:  MORPHINE.   MEDICATIONS:  1. Aspirin.  2. Plavix.  3. Zetia.  4. Pravachol.  5. Hydrochlorothiazide.  6. Coreg.   OTHER MEDICAL PROBLEMS:  See the list below.   REVIEW OF SYSTEMS:  He has no significant complaints at this time.   PHYSICAL EXAMINATION:  Blood pressure is 138/90 with a pulse of 74. The  patient is oriented to person, time and place. Affect is normal.  HEENT: Reveals no xanthelasma. He has normal extraocular motion. There  are no carotid bruits. There is no jugular venous distention.  LUNGS:  Are clear. Respiratory effort is not labored.  CARDIAC: Reveals an S1, with an S2. There are no clicks or significant  murmurs.  ABDOMEN: Soft. He has normal bowel sounds.  He has no peripheral edema.   EKG reveals sinus rhythm with nonspecific ST-T wave changes.   PROBLEM LIST:  1. History of coronary artery bypass graft in May 2000.  2. Hypertension. Blood pressure is a little higher than we would like      to see today. The patient was discharged on appropriate      medications, but he does not appear to be on an ACE inhibitor. I do      not see any absolute contraindication to an ACE inhibitor and we      will start one at this time.  3. Hypercholesterolemia, treated.  4. History of a right thalamic intracerebral hemorrhage in 2004.  5. Transient ischemic attack in 2006.  6. History of gastroesophageal reflux disease.  7. Status post recent myocardial infarction with placement of a bare      metal stent to the circumflex. Ejection fraction is near normal and      his grafts are patent with no critical distal disease.  8. Borderline normal LV function by report. In the cath lab, there was      minimal inferior hypokinesis and there was questionable      posterolateral hypokinesis.   He is doing well. I will add 2.5 mg of Ramipril to his medications and  see him back in six weeks. I will keep him on Plavix probably in the  range of six months unless it is felt that he needs this for other  additional reasons.  Luis Abed, MD, [facility]  Electronically Signed    JDK/MedQ  DD: 10/27/2007  DT: 10/27/2007  Job #: [id number]   cc:   L. Lupe Carney, M.D.

## 2011-04-17 NOTE — Patient Instructions (Signed)
Continue your current medications Follow up in 1 year with Dr Myrtis Ser

## 2011-04-20 ENCOUNTER — Ambulatory Visit: Payer: Self-pay | Admitting: Cardiology

## 2011-04-20 NOTE — H&P (Signed)
Russell Hebert, Russell Hebert             ACCOUNT NO.:  [id number]   MEDICAL RECORD NO.:  [id number]          PATIENT TYPE:  EMS   LOCATION:  MAJO                         FACILITY:  [facility]   PHYSICIAN:  Michael L. Reynolds, M.D.DATE OF BIRTH:  [birth year]   DATE OF ADMISSION:  07/11/2005  DATE OF DISCHARGE:                                HISTORY & PHYSICAL   REASON FOR EVALUATION:  Dizziness and left-sided weakness.   HISTORY OF PRESENT ILLNESS:  This is the second [facility] stroke service  admission for this 65 year old male with a past medical history which  includes known cerebrovascular disease and a right thalamic intracerebral  hemorrhage in August of 2004 at which time he was managed by Dr. Anne Hahn.  The patient was driving to work this morning about 6:30 and had sudden onset  of a dizzy sensation described as vertigo with some nausea.  He went on to  work but his dizziness worsened and he also complained of some blurred  vision in that he would look at objects and they would be blurry at first,  then would clear.  He did not have any frank diplopia or headache with this.  This progressed to the point that he felt he was unable to work and he  called his wife to pick him up.  On arriving at the work place his wife  noticed that the patient's speech was slurred and he seemed to have  difficulty using the left arm and leg.  He was brought to the [facility]  Emergency Room and a code stroke was called at approximately 10 a.m.  The  patient was given Zofran and says that his nausea is better but he still has  some dizziness.  His left side feels funny but this is at least somewhat  chronic.  He denies any headache, chest pain, or shortness of breath.  He  has had two to three similar episodes over the last three years including  the time which he was admitted with his intracerebral hemorrhage.  He last  had similar symptoms a few months ago and had CT of the head at that time.  His wife  describes these at mini strokes.   PAST MEDICAL HISTORY:  1.  Hypertension.  2.  Hypercholesterolemia.  3.  Coronary artery disease status post coronary artery bypass grafting in      2000.   FAMILY HISTORY:  Stroke, coronary artery disease, and hypertension in both  parents.   SOCIAL HISTORY:  He denies ever smoking.  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  He lives with his wife and is normally independent in  his activities of daily living.   ALLERGIES:  No known drug allergies.   MEDICATIONS:  1.  HCTZ 25 mg p.o. daily.  2.  Sular 20 mg p.o. daily.  3.  Protonix 40 mg p.o. daily.  4.  Welchol 625 mg three p.o. b.i.d.  5.  Aspirin 81 mg daily.   REVIEW OF SYSTEMS:  The patient states that  since his stroke a couple of  years ago he has been restricted from climbing stairs.  He has had fatigue  ever since that time, perhaps more recently.  He also has had postural  dizziness on and off for the last several months which is usually short-  lasting.  Otherwise, full 10-system review of systems is negative except as  outlined in the HPI and in the emergency room and admission nursing records.   PHYSICAL EXAMINATION:  VITAL SIGNS:  Temperature 97.6, blood pressure  126/83, pulse 86, respirations 20.  GENERAL:  This is a healthy-appearing man in no evident distress.  HEENT:  Cranium is normocephalic, atraumatic.  Oropharynx benign.  NECK:  Supple without carotid bruits.  HEART:  Regular rate and rhythm without murmurs.  CHEST:  Clear to auscultation bilaterally.  ABDOMEN:  Soft.  Normoactive bowel sounds.  EXTREMITIES:  1+ edema.  Normal pulses.  NEUROLOGIC:  Mental status:  He is awake, alert, and fully oriented.  Recent  and remote memory are intact.  Attention span, concentration, fund of  knowledge are all adequate.  He has no defects to computational naming and  can repeat a phrase.   Mood is euthymic and affect appropriate.  Cranial  nerves:  Pupils are equal and reactive.  Extraocular movements full without  nystagmus.  Visual fields full to confrontation.  Face, tongue, and palate  move normally and symmetrically.  Facial sensation is diminished to pin  prick on the left compared to the right.  Motor:  He has a mild drift and  some distal weakness in the left upper extremity, otherwise normal strength  in all tested extremity muscles with some decreased persistence on the left.  Sensory:  He reports diminished pin prick sensation on the left upper and  lower extremity compared to the right.  Reflexes 2+ and symmetric.  Toe is  upgoing on the left.  Finger to nose and heel to shin are performed well.  Gait is deferred.   LABORATORIES:  CBC:  White count 13.1, hemoglobin 16, platelets 277,000.  Chemistries include a minimally elevated glucose of 101, BUN and creatinine  are 19 and 1.2, respectively.  Prothrombin time is pending.  CT of the head  is personally reviewed and demonstrates several old subcortical infarcts on  the right, one in the thalamus, one in the anterior limb of the internal  capsule, and one in the deep subcortical white matter.  There does not  appear to be any acute findings.   IMPRESSION:  Suspected transient ischemic attack in a patient with previous  known right brain strokes.  With the nausea and vertigo this may be in the  posterior circulation.   PLAN:  Will admit for routine stroke work-up including MRI, MRA, carotids,  transcranial Dopplers, 2-D echocardiogram, stroke laboratories, and  telemetry monitoring.  Will have PT, OT, and speech therapy consult to help  him with his various deficits.  He might benefit from vestibular  rehabilitation given some of his symptoms of postural dizziness.  Will keep  him on his aspirin for now.  Stroke service to follow.      Michael L. Thad Ranger, M.D.  Electronically Signed    MLR/MEDQ  D:   07/11/2005  T:  07/11/2005  Job:  [id number]   cc:   L. Lupe Carney, M.D.  [address] [place]  [place] [address]  Fax: [phone]

## 2011-04-20 NOTE — Letter (Signed)
March 24, 2008    [facility].   Member ID #:  [id number].  Patient name:  Everhett Bozard.  Claim #:  [id number].   RE:  BARNES, FLOREK  MRN:  [id number]  /  DOB:  October 22, 1946   To Whom It May Concern:   Mr. Prajwal Fellner has known coronary artery disease.  He underwent a  CABG in 2000.  His grafts were patent in October of 2008.  However, he  required a bare metal stent in October of 2008.  With these  abnormalities, it was felt that cardiac rehab was quite appropriate.   The patient also has hypertension and hypercholesterolemia.  There is  history of a right thalamic intracerebral bleed in the past and a  history of a TIA.   It is very clear that Mr. Kattner has significant coronary disease  including a recent event in October of 2008.  It is also very clear that  this warrants appropriate cardiac rehab and this should be approved by  his insurance company.   Please do not hesitate to contact me with any questions.    Sincerely,      Luis Abed, MD, [facility]  Electronically Signed    JDK/MedQ  DD: 03/24/2008  DT: 03/24/2008  Job #: [id number]

## 2011-04-20 NOTE — Discharge Summary (Signed)
Russell Hebert, [name]             ACCOUNT NO.:  [id number]   MEDICAL RECORD NO.:  [id number]          PATIENT TYPE:  INP   LOCATION:  3041                         FACILITY:  [facility]   PHYSICIAN:  Pramod P. Pearlean Brownie, MD    DATE OF BIRTH:  [birth year]   DATE OF ADMISSION:  07/11/2005  DATE OF DISCHARGE:  07/13/2005                                 DISCHARGE SUMMARY   DISCHARGE DIAGNOSIS:  1.  Dizziness of uncertain etiology, possible vestibular origin, doubt      transient ischemic attack.  2.  History of right thalamic hemorrhage in 2004.  3.  History of transient ischemic attacks.  4.  Hypertension.  5.  Dyslipidemia, intolerant of statins in the past.  6.  Coronary artery disease status post coronary artery bypass grafting x 4      in 2000.  7.  Myocardial infarction in 2000.  8.  Gastroesophageal reflux disease.  9.  Cardiac stents x 3 in the year 2000.   DISCHARGE MEDICATIONS:  Hydrochlorothiazide 25 mg daily, Welchol 1875 mg  b.i.d., Sular 25 mg daily, Protonix 40 mg daily, aspirin 325 mg daily.   STUDIES PERFORMED:  1.  CT of the brain on admission shows chronic small vessel insult,      particularly in the basal ganglion thalamus, no acute lesions.  2.  MRI of the brain shows no acute insult, old ischemic infarction in the      brain stem, cerebellum, basal ganglion thalamus, and white matter.  3.  MRA of the neck is negative.  4.  MRA of the head is negative.  5.  EKG shows normal sinus rhythm and nonspecific T-wave abnormality.  6.  2D echocardiogram shows ejection fraction of 65% with no left      ventricular regional wall motion abnormalities, no embolic source.  7.  Carotid Doppler is normal.  8.  Transcranial Doppler was performed, results pending.   LABORATORY DATA:  Hemoglobin A1C 5.6.  Homocystine 9.42.  Cholesterol 213,  triglycerides 222, HDL 41, and LDL 128.  Chemistry with glucose 107 and  albumin 3.4, otherwise, normal.  Liver function tests normal.  CBC  normal.   HISTORY OF PRESENT ILLNESS:  Russell Hebert is a 65 year old right  handed white male with a past medical history which includes non-  cerebrovascular disease and a right thalamic cerebral hemorrhage in August  2004.  On the morning of admission, he was driving to work and had sudden  onset of a dizzy sensation described as vertigo with some nausea.  He went  onto work but his dizziness worsened and he complained of some blurred  vision.  He did not have any frank diplopia or headache.  This progressed to  the point that he called his wife to pick him up from work.  On arriving at  the work place, the wife noticed the patient's speech was slurred and he  seemed to have difficulty using the left arm and leg.  He was brought to the  [facility] Emergency Room and a code stroke was called.  The patient does  complain  his left side feels funny, but this is somewhat chronic.  He has  had 2-3 similar episodes in the past three years including the time when he  was admitted with his intracerebral hemorrhage.  He had similar symptoms a  few months ago and CT of the head was unremarkable at that time.  His wife  described these episodes as mini-strokes.  He was not given t-PA secondary  to quick resolution.  He was admitted to the hospital for further stroke  evaluation.   HOSPITAL COURSE:  MRI of the brain was negative for acute infarction.  It  was not felt that acute dizziness was related to TIA.  It is felt that it  may be vestibular in nature, recommend outpatient vestibular PT but no other  neurologic follow up.  Cardiology was asked to see the patient in the  hospital secondary to the dizziness.  There is no obvious cardiac etiology,  though they were going to make arrangements for an outpatient event monitor  at discharge.   Both neurology and cardiology recommend statin.  The patient states he has  been tried on statins in the past but they made him go crazy.  The  patient  states he has tried several by Dr. Clovis Riley.  Unsure of time frame of these  medicines or what they are.  We will send back to Dr. Clovis Riley and ask for  more aggressive cholesterol control with statins long term if patient can  tolerate.   CONDITION ON DISCHARGE:  He is alert and oriented x 3.  No aphasia.  Cranial  nerves intact.  No nystagmus.  No focal deficits.   DISCHARGE PLAN:  1.  Discharge home with family.  2.  Outpatient vestibular PT.  3.  Outpatient event monitor with Dr. Myrtis Ser.  4.  Recommend statin.  Will follow up with Dr. Clovis Riley within one week and      have him follow this.  5.  Follow up with Dr. Myrtis Ser as needed.  6.  No neurologic follow up required.      S   SB/MEDQ  D:  07/13/2005  T:  07/13/2005  Job:  [id number]   cc:   L. Lupe Carney, M.D.  [address] [place]  [place] [address]  Fax: [phone]   [facility] Card[facility]

## 2011-04-20 NOTE — Op Note (Signed)
[place]  Patient:    GRANVEL, PROUDFOOT Visit Number: [id number] MRN: [id number]          Service Type: MED Location: [phone] Attending Physician:  Arlis Porta Dictated by:   Adolph Pollack, M.D. Proc. Date: 02/09/02 Admit Date:  02/08/2002                             Operative Report  PREOPERATIVE DIAGNOSIS:  Incarcerated right inguinal hernia.  POSTOPERATIVE DIAGNOSIS:  Incarcerated right inguinal hernia.  OPERATION PERFORMED:  Repair of incarcerated inguinal hernia with mesh.  SURGEON:  Adolph Pollack, M.D.  ANESTHESIA:  General.  OPERATIVE FINDINGS:  There is an incarcerated omentum and preperitoneal fat noted.  No compromised bowel noted.  There is also a hydrocele present.  INDICATIONS FOR PROCEDURE:  The patient is a 65 year old male who has known about an enlarging right inguinal hernia for months now.  He had acute severe pain this evening.  It was unrelenting and led to him passing out and vomiting. He was brought to the emergency department with the hernia only partially reducible.  He is now brought to the operating room for emergency repair.  DESCRIPTION OF PROCEDURE:  He was placed supine on the operating room table and a general anesthetic was administered.  The abdomen and groin and scrotum were sterilely prepped and draped.  A right groin incision was made incising the skin and subcutaneous tissue sharply.  The external oblique aponeurosis was identified.  0.5% plain Marcaine was infiltrated deep to this and incision was made in the external oblique aponeurosis and it was split in the direction of its fibers through the external ring medially and up toward the anterior superior iliac spine laterally.  The large hernia was then exposed.  I used blunt dissection to expose the shelving edge of the inguinal ligament inferiorly and the internal oblique muscle and aponeurosis superiorly.  I  then encircled what I felt was the contents of the spermatic cord and hernia.  The sac was very large.  It was coming through the internal ring consistent with the indirect hernia.  It took me some time but I was able to identify cord structures, tried to preserve a testicular artery, vein and the vas deferens. The ilioinguinal nerve was difficult to identify and may have been sacrificed. I was reducing the sac and noted the testicle coming up into it with a small hydrocele around it which I excised.  I then made sure the testicle went back into the scrotum.  I finally reduced the hernia sac and its extraperitoneal fatty contents.  I reduced the extraperitoneal fat back into the extraperitoneal cavity.  I then ligated the hernia sac with a 2-0 silk pursestring suture and  excised it.  I reduced the sac back into the peritoneal cavity, back into the extraperitoneal space.  Next a 3 by 6 inch piece of polypropylene mesh was brought into the field and anchored 1 cm medial to the pubic tubercle.  The inferior edge of the mesh was then anchored to the shelving edge of the inguinal ligament with running 2-0 Prolene suture up to a point 1 cm lateral to the internal ring.  A slit was then cut in the mesh and wrapped around the spermatic cord.  The superior aspect of the mesh was anchored to the internal oblique muscle and aponeurosis with interrupted 2-0 Vicryl sutures.  Two tails  were crossed creating a new internal ring and these were anchored to the shelving edge of the inguinal ligament with 2-0 Prolene suture.  The tip of the hemostat was able to fit through the aperture.  Next the external oblique was closed over the spermatic cord and meshed with running 3-0 Vicryl suture.  Scarpas fascia was closed with running 2-0 Vicryl suture and the skin was closed with running 4-0 Monocryl subcuticular stitch. Steri-Strips and sterile dressings were applied.  The right testicle was in its scrotum in  its normal position.  The patient tolerated the procedure well without any apparent complications and was taken to the recovery room in satisfactory condition. Dictated by:   Adolph Pollack, M.D. Attending Physician:  Arlis Porta DD:  02/09/02 TD:  02/09/02 Job: [id number]

## 2011-04-20 NOTE — Discharge Summary (Signed)
NAMEJAROM, Russell                         ACCOUNT NO.:  [id number]   MEDICAL RECORD NO.:  [id number]                   PATIENT TYPE:  INP   LOCATION:  3024                                 FACILITY:  [facility]   PHYSICIAN:  Marlan Palau, M.D.               DATE OF BIRTH:  10-25-1946   DATE OF ADMISSION:  05/18/2003  DATE OF DISCHARGE:  05/21/2003                                 DISCHARGE SUMMARY   ADMISSION DIAGNOSES:  1. 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  2. Hypertension.  3. Obesity.   DISCHARGE DIAGNOSES:  1. 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  2. Hypertension.  3. Obesity.   PROCEDURES:  1. Computed tomography of the brain.  2. Carotid Doppler study.  3. Two-dimensional echocardiogram.   COMPLICATIONS:  None.   HISTORY OF PRESENT ILLNESS:  Russell Hebert is a 65 year old, right-handed,  white male born [birth year], with a history of hypertension, heart disease  and medical noncompliance.  This patient was admitted through [facility]  Emergency Room for evaluation of left arm numbness and tingling sensation.  The patient became somewhat numb, weak and had difficulty with ambulation.  The patient had some dizziness as well with no nausea, vomiting, visual  field changes or blackout episodes.  The patient went to the emergency room  and underwent a CT scan of the brain showing a hemorrhagic infarction of the  right thalamocapsular region.  The patient was admitted for further  evaluation.   PAST MEDICAL HISTORY:  1. New right thalamocapsular hemorrhagic infarction.  2. Hypertension.  3. Obesity.  4. Medical noncompliance.  5. 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  6. History of MI.  7. Hypercholesterolemia.  8. Inguinal hernia repair.  9. Renal calculi.  10.      Tonsillectomy.   MEDICATIONS:  He is on no medications at this time.  He has been off of  all  medications since 2001.   ALLERGIES:  MORPHINE.   SOCIAL HISTORY:  He does not smoke.  Drinks alcohol in the form of beer on  occasion.   Please refer to History and Physical dictation for social history, family  history, review of systems and physical examination.   LABORATORY DATA AND X-RAY FINDINGS:  Notable for sodium 140, potassium 3.7,  chloride 104, CO2 28, glucose 99, BUN 13, creatinine 1.1, calcium 9.3,  magnesium 2.3.  Total protein 6.3, albumin 3.7, AST 23, ALT 26, Alk phos 81,  total bilirubin 0.7.  CK level 86, troponin I 0.01.  Cholesterol level 249,  triglycerides 202. VLDL 40, LDL 164, HDL 45.   Chest x-ray shows no acute disease.   HOSPITAL COURSE:  The patient is admitted to [facility] on May 18, 2003, for left-sided sensory and motor symptoms and gait instability.  The  patient was  found to have a right thalamocapsular hemorrhagic infarction.  The patient was also hypertensive on admission with blood pressure 192/125  range.  The patient was set up for repeat CT scan of the head which showed  good stability 48 hours after the initial onset of symptomatology.  The  patient has been placed back on medications for blood pressure including  hydrochlorothiazide and Norvasc and blood pressures have come down nicely  currently in the 116/80 range.  The patient has had a cholesterol panel  showing evidence of elevated cholesterol levels and LDL and will be placed  on Lipitor 20 mg a day.  The patient has been seen by physical, occupational  and speech therapy and is felt to be an adequate candidate for inpatient  rehabilitation.  The patient has undergone a carotid Doppler study that is  unremarkable.  A 2D echocardiogram shows an ejection fraction of 50-55%,  hypokinesis of the basal inferior wall, mild wall thickness with increase in  the left ventricle noted.  Mild aortic root dilation is seen and mild mitral  anular calcification is noted.  EKG has  revealed normal sinus rhythm with  old diaphragmatic myocardial infarction at rate of 73.  Plans are at this  point to discharge the patient to rehabilitation surface prior to going  home.   DISCHARGE MEDICATIONS:  1. Hydrochlorothiazide 25 mg a day.  2. Norvasc 5 mg a day.  3. Sublingual nitroglycerin if needed for chest pain.  4. Restoril 30 mg a night for sleep.  5. Protonix 40 mg daily.  6. Lipitor 20 mg a day.   SPECIAL INSTRUCTIONS:  At some point in the next two months, the patient can  be placed back on low dose aspirin 81 mg a day.  We are not to initiate this  at this point.   FOLLOW UP:  The patient will need to have medical follow up with Dr.  Arvilla Market for ongoing blood pressure and cholesterol management.   CONDITION ON DISCHARGE:  At the time of this dictation, the patient is  bright, alert and cooperative.  He actually has very good strength in all  fours.  Mild left upper extremity drift is seen.  The patient's gait,  however, is unsteady with a wide-based gait.  The patient does require  minimal assistance for walking.  The patient works as a Development worker, international aid requiring climbing, heights, carrying large amounts  of weight.  The patient may need to be on Disability for some point over the  next several months.                                               Marlan Palau, M.D.    CKW/MEDQ  D:  05/21/2003  T:  05/21/2003  Job:  [id number]   cc:   Donia Guiles, M.D.  [address] [place]  [place] [address]  Fax: [phone]   Willa Rough, M.D.   [facility]   [facility]    cc:   Donia Guiles, M.D.  [address] [place]  [place] [address]  Fax: [phone]   Willa Rough, M.D.   [facility]   [facility]

## 2011-04-20 NOTE — H&P (Signed)
[place]  Patient:    Russell Hebert, Russell Hebert Visit Number: [id number] MRN: [id number]          Service Type: MED Location: [phone] Attending Physician:  Arlis Porta Dictated by:   Adolph Pollack, M.D. Admit Date:  02/08/2002                           History and Physical  CHIEF COMPLAINT: Right groin pain and swelling.  HISTORY OF PRESENT ILLNESS: Mr. Loughney is a 65 year old male, who has had a right inguinal bulge extending down to his scrotum for many months now.  Over the past three weeks becoming more painful until tonight.  He was watching television and when he got up he had such severe pain that he passed out and became nauseated and vomited.  He had been passing some gas.  He also reported lower abdominal pain.  He states he has been normally able to reduce his hernia in the past but was not able to do so tonight.  PAST MEDICAL HISTORY: Coronary artery disease, status post myocardial infarction.  PAST SURGICAL HISTORY: Coronary artery bypass grafting in 2000.  ALLERGIES: None.  MEDICATIONS: Aspirin one q.d.  SOCIAL HISTORY: He is married and works.  No tobacco use.  He has beer daily.  REVIEW OF SYSTEMS: CARDIOVASCULAR: No further chest pain, and he denies hypertension.  PULMONARY: No asthma, pneumonia, COPD.  GI: No hepatitis, diverticulitis.  GU: He states he has had kidney stones in the past. ENDOCRINE: No diabetes.  NEUROLOGIC: No seizures or strokes.  HEMATOLOGIC: No bleeding disorder or blood transfusions.  PHYSICAL EXAMINATION:  GENERAL: Well-developed, well-nourished male, appears to be somewhat uncomfortable, holding the right groin area and lying on the cart.  VITAL SIGNS: Temperature 97 degrees, blood pressure 128/89, pulse 76.  SKIN: Warm and dry, without rash or jaundice.  HEENT: EOMI.  No icterus.  NECK: Supple without palpable masses.  CARDIOVASCULAR: Increased rate with regular rhythm.   No carotid bruits.  RESPIRATORY: Breath sounds equal and clear.  Respirations nonlabored.  ABDOMEN: Soft, with mild lower abdominal tenderness.  Active bowel sounds are noted.  GU: There is a right inguinal hernia bulge that is not completely reducible and is tender.  Right testicle palpable.  No left inguinal bulge noted.  EXTREMITIES: No cyanosis or edema.  LABORATORY DATA: WBC 11,200, hemoglobin 15.9.  Electrolytes within normal limits.  IMPRESSION: Incarcerated right inguinal hernia with increasing pain, nausea, and vomiting.  PLAN: To the operating room for emergency hernia repair, possible bowel resection.  I did explain the procedure, rationale, and risks to him.  The risks include, but not limited to, bleeding, infection, nerve damage, testicular ischemia and deterioration, recurrence of the hernia, and risks of general anesthesia.  He seems to understand these and agrees to proceed. Dictated by:   Adolph Pollack, M.D. Attending Physician:  Arlis Porta DD:  02/09/02 TD:  02/09/02 Job: [id number]

## 2011-04-20 NOTE — H&P (Signed)
NAMEMAXTYN, NUZUM NO.:  [id number]   MEDICAL RECORD NO.:  [id number]                   PATIENT TYPE:  EMS   LOCATION:  MAJO                                 FACILITY:  [facility]   PHYSICIAN:  Marlan Palau, M.D.               DATE OF BIRTH:  1946/07/27   DATE OF ADMISSION:  05/18/2003  DATE OF DISCHARGE:                                HISTORY & PHYSICAL   HISTORY OF PRESENT ILLNESS:  Russell Hebert is a 65 year old right-handed  white male born 09/01/1946 with a history of hypertension, heart disease, and  medical noncompliance.  The patient presents to the [facility] emergency  room for an evaluation of left arm numbness, tingling sensations that began  while trying to shave today with the onset of weakness shortly thereafter.  The left arm, and then the left leg, became numb and weak.  Some numbness on  the left face.  The patient denied headache.  Did note some dizziness.  Denies nausea, vomiting, visual field changes, blackout episodes.  This  patient went to the emergency room and underwent a CT scan of the brain that  shows evidence of a hemorrhagic stroke involving the right  thalamic/intracapsular area.  Old caudate head infarction noted, as well, on  the right.  Neurology has been called for further evaluation of this  patient.  The patient had been on medication for blood pressure and his  heart up until 2001.  He stopped all the medications because of the fact  that he was feeling poorly.  The patient also went off of his aspirin at  that time.   PAST MEDICAL HISTORY:  1. New onset of right thalamic intracranial hemorrhage versus hemorrhagic     stroke.  2. Hypertension.  3. Obesity.  4. Medical noncompliance.  5. Organic heart disease status post CABG procedure x4.  6. History of MI.  7. History of hypercholesterolemia.  8. History of inguinal hernia repair.  9. History of renal calculi.  10.      History of tonsillectomy.   MEDICATIONS:  The patient is on no medications at the time of admission.   ALLERGIES:  MORPHINE SULFATE.   SOCIAL HISTORY:  Does not smoke.  Drinks alcohol in the form of beer on  occasion.  This patient is married and lives in the [place] area.  He  works as an Public librarian.  He has two children who  are fully grown, alive, and well.   FAMILY MEDICAL HISTORY:  Notable for some problems; in fact, his mother died  with emphysema, father died with stroke and heart attack.  The patient has  two brothers, both with some heart disease.  No sisters.   REVIEW OF SYSTEMS:  Notable for no fevers, chills.  The patient denies any  neck stiffness.  Does have some shortness of breath.  Has had some chest  pain  and chest tightness in the last 24 hours.  Denies nausea or vomiting.  Dizziness this morning.  Denies blackout spells.  No prior history of stroke  that he knew of.   PHYSICAL EXAMINATION:  VITAL SIGNS:  Blood pressure was initially 192/125.  Diastolics are now in the 99 range.  Pulse 96, respiratory rate 24,  temperature 99.2.  GENERAL:  This patient is a moderately obese white male who is alert and  cooperative at the time of examination.  HEENT:  Head is atraumatic.  Eyes - pupils equal, round and reactive to  light.  Disks soft and flat bilaterally.  NECK:  Supple.  No carotid bruits noted.  RESPIRATORY:  Clear.  CARDIOVASCULAR:  Distant heart sounds.  No obvious murmurs or rubs noted.  EXTREMITIES:  Without significant edema.  ABDOMEN:  Positive bowel sounds.  No organomegaly or tenderness is noted.  NEUROLOGIC:  Cranial nerves as above.  Facial symmetry is relatively intact.  The patient notes good symmetric pinprick sensation on the face.  Was able  to protrude the tongue in the midline.  No aphasia noted.  Extraocular  movements are full.  Visual fields are full.  Motor testing reveals 4 to 4-  /5 strength in the left upper extremity.  Normal to near  normal strength of  the left lower extremity.  Strength is normal on the right side.  The  patient has decreased pinprick sensation in the left forearm as compared to  the right.  Otherwise pinprick sensation is symmetric and normal on the face  and legs.  Vibratory sensation is symmetric throughout.  The patient has  fair finger-nose-finger with right upper extremity and toe-to-finger with  both lower extremities.  Has difficulty performing finger-nose-finger with  the left upper extremity.  Some drift is noted of the left upper extremity.  Deep tendon reflexes are depressed but symmetric throughout.  Toes are  neutral bilaterally.  The patient was not ambulated.   CT scan of the head is as above.  Chest x-ray is pending.  EKG has been  performed and reveals normal sinus rhythm, heart rate of 73.  Otherwise  unremarkable.   LABORATORY VALUES:  Notable for white count of 6.8, hemoglobin 17.1,  hematocrit 49.0, MCV 86.7, platelets of 246.  INR of 0.8.  Sodium 141,  potassium 3.8, chloride 109, CO2 24, glucose 115, BUN 12, creatinine 1.3,  calcium 9.5, total protein 6.3, albumin 3.7, AST 23, ALT 26, alkaline  phosphatase 21, total bilirubin 0.7.  Again, chest x-ray is pending at this  time.   IMPRESSION:  1. Hemorrhagic stroke involving the right thalamic/intracapsular area.  2. Hypertension.  3. Medical noncompliance.  4. The patient has mild sensory changes involving the left side, some left     arm weakness.  Will admit this patient for further evaluation and     observation.  Will need to control blood pressures.   PLAN:  1. Admission to [facility].  2. Carotid Doppler study.  3. 2-D echocardiogram.  4. Physical, occupational, and speech therapy.  5. Blood pressure medications.  6. Will follow the patient's clinical course while in house.  7. Repeat a CT scan of the head in the a.m.  Marlan Palau, M.D.   CKW/MEDQ   D:  05/18/2003  T:  05/18/2003  Job:  [id number]   cc:   Donia Guiles, M.D.  [address] [place]  [place] [address]  Fax: [phone]   [facility]  [address]  [address]   Willa Rough, M.D.    cc:   Donia Guiles, M.D.  [address] [place]  [place] [address]  Fax: [phone]   [facility]  [address]  [address]   Willa Rough, M.D.

## 2011-04-20 NOTE — H&P (Signed)
[name], Russell Hebert             ACCOUNT NO.:  [id number]   MEDICAL RECORD NO.:  [id number]          PATIENT TYPE:  INP   LOCATION:  3041                         FACILITY:  [facility]   PHYSICIAN:  Olga Millers, M.D. LHCDATE OF BIRTH:  1946-10-03   DATE OF ADMISSION:  07/11/2005  DATE OF DISCHARGE:                                HISTORY & PHYSICAL   CARDIOLOGIST:  Dr. Willa Rough; however, the patient has not seen him since  approximately 2002.   PRIMARY CARE PHYSICIAN:  Dr. Elsworth Soho.   REASON FOR CONSULTATION:  The patient was admitted by the stroke team.  We  were asked to consult. The patient's diagnosis is a questionable TIA,  questionable pre-syncopal episode.   HABITS:  Russell Hebert is a 65 year old Caucasian gentleman with a known  history of coronary artery disease, who states that he has had dizzy  episodes three or four times over the last few months.  He presented to  [facility] this admission for an episode of dizziness,  associated with nausea yesterday a.m. while driving to work.  He states he  had a sudden onset of dizziness and got real nauseated.  He went on to work;  however, his symptoms persist and became associated with diaphoresis and  feelings of hot flashes with the nausea and dizziness.  The patient states  his wife came and got him and brought him to the emergency room.  He states  these same episodes have occurred and are usually associated with bending  over or climbing up ladders at work.  He denies any chest pain, shortness of  breath or dyspnea on exertion.  He states he just tires more easily since he  had a stroke.  He has resulting residual left-sided weakness.  He also  complains of a roaring in his ears at times.   PAST MEDICAL HISTORY:  1.  A cardiac catheterization in 2000, with stent placement in March 2000,      and follow-up bypass x4 in April 2000.  The records are unavailable at      this time concerning his  coronary artery disease.  2.  Status post myocardial infarction in 2000, which prompted his cardiac      workup.  3.  A CVA, right thalamic hemorrhage in 2004.  4.  TIA's.  5.  Hypertension.  6.  Hypercholesterolemia.  7.  Gastroesophageal reflux disease.   ALLERGIES:  No known drug allergies.   MEDICATIONS:  1.  Aspirin 325 mg daily.  2.  Welchol 1875 mg daily.  3.  Lovenox 40 mg subcu daily.  4.  HCTZ 25 mg daily.  5.  Solar 20 mg daily.  6.  Protonix 40 mg daily.  7.  The patient is also receiving Reglan p.r.n.  8.  Also Zofran p.r.n.   SOCIAL HISTORY:  He lives in [place] with his wife.  He works for [employer].  He has adult children alive and well, without  problems.  He denies any tobacco use.  He has a beer occasionally with  meals.  Denies any  routine exercise.  Diet:  No-added salt.  Denies any drug  or over-medication use.   FAMILY HISTORY:  Mother deceased at age 68, from chronic obstructive  pulmonary disease.  She also had known coronary artery disease and  hypertension.  Father deceased at age 59, congestive heart failure and an  aneurysm.  Siblings:  The patient has a twin brother who has had a  myocardial infarction.  Another brother with hypertension.   REVIEW OF SYSTEMS:  Positive for sweats, vertigo, edema in the right leg,  status post bypass vein harvesting, pre-syncope with dizziness episodes.  No  actual syncopal episodes.  NEUROPSYCH:  Positive for weakness to his left  side, status post CVA.  GI:  Positive for nausea and gastroesophageal reflux  disease symptoms and heat intolerance.   PHYSICAL EXAMINATION:  VITAL SIGNS:  Temperature 98.2 degrees, pulse 70,  respirations 18, blood pressure ranging from 118/81 to 125/83.  He is  saturating 100% on room air.  GENERAL:  He is in no acute distress, very pleasant and cooperative  gentleman.  HEENT:  Pupils equal, round, reactive to light.  Sclerae clear.  NECK:  Supple without  lymphadenopathy, bruit or jugular venous distention.  CARDIOVASCULAR:  Regular rate and regular rhythm.  S1 and S2.  His pulses  are 2+ and equal without bruits.  LUNGS:  Clear to auscultation.  ABDOMEN:  Soft, nontender.  Positive bowel sounds.  EXTREMITIES:  No clubbing, cyanosis or edema.  He has 2+ DP's bilaterally.  NEUROLOGIC:  He is alert and oriented x3.  Cranial nerves II-XII  are  grossly intact.  The patient does have a decreased grip, left hand.   TESTS:  The patient had an MRI of the head which was negative for acute  changes.  A CT of the head without acute changes.   Status post echocardiogram read by Dr. Willa Rough.  The ejection fraction  was 65%, negative for embolism, normal left ventricular wall motion.  No  abnormalities noted.   An electrocardiogram at a rate of 77, normal sinus rhythm.  No ST or T-wave  changes significant.  Has nonspecific T-wave abnormality.   LABORATORY DATA:  White blood cell count 6.7, hemoglobin 15.2, hematocrit  44, platelet count 222,000.  Sodium 137, potassium 3.5, chloride 102, CO2 of  24, BUN 15, creatinine 1.1, glucose 107.  AST 20, ALT 29.  Hemoglobin A1c of  5.6.  Triglycerides 222, total cholesterol 213, HDL 41, LDL 128.   IMPRESSION:  Dr. Olga Millers went to examine the patient.  He is a 65-  year-old gentleman with a known history of coronary artery disease, status  post bypass, stable since the bypass surgery in 2002.  Denies any chest  pain, shortness of breath.  Able to work 10-hour days involving heavy  lifting, without any chest discomfort; however, experiencing dizziness  associated with postural and positional changes.  Questionable pre-syncopal  episode with dizziness.  Also with hypercholesterolemia, poorly controlled  on Welchol with questionable diet compliance.  He has known hypertension.  His blood pressure is stable on Solar and HCTZ.  RECOMMENDATIONS:  No further cardiac inpatient testing is recommended.   We  will follow the patient as an outpatient in the office and possibly proceed  with an event monitor, or as per Dr. Henrietta Hebert recommendations.      Mic   MB/MEDQ  D:  07/12/2005  T:  07/13/2005  Job:  [id number]

## 2011-09-11 LAB — CBC
HCT: 43.3
Hemoglobin: 14.9
MCHC: 34.3
MCV: 87.8
Platelets: 213
RBC: 4.93
RDW: 14.3 — ABNORMAL HIGH
WBC: 9.5

## 2011-09-11 LAB — BASIC METABOLIC PANEL
BUN: 18
CO2: 27
Calcium: 8.9
Chloride: 105
Creatinine, Ser: 1.09
GFR calc Af Amer: >60
GFR calc non Af Amer: >60
Glucose, Bld: 98
Potassium: 3.7
Sodium: 139

## 2011-09-12 LAB — LIPID PANEL
Cholesterol: 180
HDL: 47
LDL Cholesterol: 92
Total CHOL/HDL Ratio: 3.8
Triglycerides: 203 — ABNORMAL HIGH
VLDL: 41 — ABNORMAL HIGH

## 2011-09-12 LAB — POCT CARDIAC MARKERS
CKMB, poc: 1.6
Myoglobin, poc: 85.3
Operator id: 198171
Troponin i, poc: <0.05

## 2011-09-12 LAB — CARDIAC PANEL(CRET KIN+CKTOT+MB+TROPI)
CK, MB: 105.5 — ABNORMAL HIGH
CK, MB: 208.6 — ABNORMAL HIGH
CK, MB: 48 — ABNORMAL HIGH
Relative Index: 17 — ABNORMAL HIGH
Relative Index: 21.5 — ABNORMAL HIGH
Relative Index: 25.1 — ABNORMAL HIGH
Total CK: 282 — ABNORMAL HIGH
Total CK: 491 — ABNORMAL HIGH
Total CK: 832 — ABNORMAL HIGH
Troponin I: 19.86
Troponin I: 37.77
Troponin I: 8.93

## 2011-09-12 LAB — I-STAT 8, (EC8 V) (CONVERTED LAB)
Acid-Base Excess: 1
BUN: 18
Bicarbonate: 20.7
Chloride: 105
Glucose, Bld: 141 — ABNORMAL HIGH
HCT: 50
Hemoglobin: 17
Operator id: 198171
Potassium: 3.5
Sodium: 135
TCO2: 21
pCO2, Ven: 22.8 — ABNORMAL LOW
pH, Ven: 7.567 — ABNORMAL HIGH

## 2011-09-12 LAB — CBC
HCT: 44.1
Hemoglobin: 14.9
MCHC: 33.9
MCV: 89
Platelets: 216
RBC: 4.95
RDW: 14.4 — ABNORMAL HIGH
WBC: 9.2

## 2011-09-12 LAB — BASIC METABOLIC PANEL
BUN: 13
CO2: 26
Calcium: 8.8
Chloride: 106
Creatinine, Ser: 1.13
GFR calc Af Amer: >60
GFR calc non Af Amer: >60
Glucose, Bld: 103 — ABNORMAL HIGH
Potassium: 4.1
Sodium: 138

## 2011-09-12 LAB — POCT I-STAT CREATININE
Creatinine, Ser: 1.1
Operator id: 198171

## 2011-09-17 LAB — ANAEROBIC CULTURE

## 2011-09-17 LAB — POCT I-STAT 4, (NA,K, GLUC, HGB,HCT)
Glucose, Bld: 96
HCT: 45
Hemoglobin: 15.3
Operator id: 268271
Potassium: 3 — ABNORMAL LOW
Sodium: 142

## 2011-09-17 LAB — WOUND CULTURE: Culture: NO GROWTH

## 2011-12-10 ENCOUNTER — Encounter: Payer: Self-pay | Admitting: Cardiology

## 2012-05-09 ENCOUNTER — Encounter: Payer: Self-pay | Admitting: Cardiology

## 2012-05-09 ENCOUNTER — Ambulatory Visit (INDEPENDENT_AMBULATORY_CARE_PROVIDER_SITE_OTHER): Payer: Medicare PPO | Admitting: Cardiology

## 2012-05-09 VITALS — BP 131/81 | HR 77 | Ht 68.0 in | Wt 199.0 lb

## 2012-05-09 DIAGNOSIS — I10 Essential (primary) hypertension: Secondary | ICD-10-CM

## 2012-05-09 DIAGNOSIS — I779 Disorder of arteries and arterioles, unspecified: Secondary | ICD-10-CM

## 2012-05-09 DIAGNOSIS — I2510 Atherosclerotic heart disease of native coronary artery without angina pectoris: Secondary | ICD-10-CM

## 2012-05-09 NOTE — Assessment & Plan Note (Signed)
The patient's last carotid Doppler was December, 2010. He had only mild disease. It is now 2-1/2 years and we will do a followup carotid Doppler. I will be in touch with the information.

## 2012-05-09 NOTE — Assessment & Plan Note (Signed)
Blood pressures control. No change in therapy. 

## 2012-05-09 NOTE — Assessment & Plan Note (Signed)
Coronary disease is stable. No further workup. He needs continued secondary prevention.

## 2012-05-09 NOTE — Progress Notes (Signed)
HPI Patient is doing well. He seen for cardiology followup. He underwent CABG in 2000. He had an acute lateral MI with bare-metal stent in 2008. Repeat cath in November, 2009 revealed that  The OM stent was patent but there was some sluggish distal flow. Medical therapy was recommended. The patient remains on dual antiplatelet therapy long term. He tolerates this well. He is doing well. Is not having any chest pain or shortness of breath.  Allergies  Allergen Reactions  . Morphine     Current Outpatient Prescriptions  Medication Sig Dispense Refill  . aspirin 81 MG tablet Take 81 mg by mouth daily.        . carvedilol (COREG) 3.125 MG tablet Take 3.125 mg by mouth 2 (two) times daily with a meal.        . clopidogrel (PLAVIX) 75 MG tablet Take 75 mg by mouth daily.        . diazepam (VALIUM) 5 MG tablet Take 5 mg by mouth every 6 (six) hours as needed.        . hydrochlorothiazide (HYDRODIURIL) 12.5 MG tablet Take 12.5 mg by mouth daily.        [name] losartan (COZAAR) 50 MG tablet Take 50 mg by mouth daily.        [name] omeprazole (PRILOSEC) 20 MG capsule Take 20 mg by mouth daily.        . simvastatin (ZOCOR) 40 MG tablet Take 40 mg by mouth at bedtime.          History   Social History  . Marital Status: Married    Spouse Name: N/A    Number of Children: N/A  . Years of Education: N/A   Occupational History  . retired     Sport and exercise psychologist   Social History Main Topics  . Smoking status: Never Smoker   . Smokeless tobacco: Not on file  . Alcohol Use: No  . Drug Use: No  . Sexually Active: Not on file   Other Topics Concern  . Not on file   Social History Narrative  . No narrative on file    No family history on file.  Past Medical History  Diagnosis Date  . Coronary artery disease     Acute lateral MI October, 2008, bare-metal stent circumflex /  catheterization November, 2009, LIMA-LAD patent,, all vein grafts patent OM stent patent... some sluggish distal  flow... medical therapy  . Hypertension   . Hypercholesterolemia     Simvastatin started 2009  . Intracerebral bleed     right thalamic, 2004, despite this tolerates aspirin and Plavix  . TIA (transient ischemic attack) 2006  . GERD (gastroesophageal reflux disease)   . Hx of CABG     2000  . Ejection fraction     Low normal, inferior hypokinesis with some posterior lateral hypokinesis  . Carotid artery disease     Doppler, December, 2010, 0-39% bilateral    Past Surgical History  Procedure Date  . Coronary artery bypass graft 2000    Acute lateral MI and bare-metal stent circumflex..(10/08) Cath 10/14/08; LIMA to the LAD patent ; all vein grafts patent; OM stent patent..some sluggish distal flow.    ROS Patient denies fever, chills, headache, sweats, rash, change in vision, change in hearing, chest pain, cough, nausea vomiting, urinary symptoms. All other systems are reviewed and are negative.  PHYSICAL EXAM Patient looks quite good. He is oriented to person time and place. Affect is normal. There  is no jugulovenous distention. Lungs are clear. Respiratory effort is nonlabored. Cardiac exam reveals S1-S2. There no clicks or significant murmurs. The abdomen is soft. There is no peripheral edema.  Filed Vitals:   05/09/12 0908  BP: 131/81  Pulse: 77  Height: 5\' 8"  (1.727 m)  Weight: 199 lb (90.266 kg)   EKG is done today and reviewed by me. There are nonspecific ST changes. There is no significant change from the past. ASSESSMENT & PLAN

## 2012-05-09 NOTE — Patient Instructions (Signed)
Your physician wants you to follow-up in: 1 year.   You will receive a reminder letter in the mail two months in advance. If you don't receive a letter, please call our office to schedule the follow-up appointment.  Your physician has requested that you have a carotid duplex. This test is an ultrasound of the carotid arteries in your neck. It looks at blood flow through these arteries that supply the brain with blood. Allow one hour for this exam. There are no restrictions or special instructions.    

## 2012-05-23 ENCOUNTER — Encounter (INDEPENDENT_AMBULATORY_CARE_PROVIDER_SITE_OTHER): Payer: Medicare PPO

## 2012-05-23 DIAGNOSIS — I6529 Occlusion and stenosis of unspecified carotid artery: Secondary | ICD-10-CM

## 2012-05-29 ENCOUNTER — Encounter: Payer: Self-pay | Admitting: Cardiology

## 2012-07-23 ENCOUNTER — Encounter: Payer: Self-pay | Admitting: Cardiology

## 2013-02-19 ENCOUNTER — Encounter: Payer: Self-pay | Admitting: Cardiology

## 2013-05-18 ENCOUNTER — Encounter (INDEPENDENT_AMBULATORY_CARE_PROVIDER_SITE_OTHER): Payer: Medicare PPO

## 2013-05-18 DIAGNOSIS — I6529 Occlusion and stenosis of unspecified carotid artery: Secondary | ICD-10-CM

## 2013-05-19 ENCOUNTER — Encounter: Payer: Self-pay | Admitting: Cardiology

## 2013-05-26 ENCOUNTER — Ambulatory Visit (INDEPENDENT_AMBULATORY_CARE_PROVIDER_SITE_OTHER): Payer: Medicare PPO | Admitting: Cardiology

## 2013-05-26 ENCOUNTER — Encounter: Payer: Self-pay | Admitting: Cardiology

## 2013-05-26 VITALS — BP 130/80 | HR 76 | Ht 68.0 in | Wt 195.0 lb

## 2013-05-26 DIAGNOSIS — I2510 Atherosclerotic heart disease of native coronary artery without angina pectoris: Secondary | ICD-10-CM

## 2013-05-26 DIAGNOSIS — I779 Disorder of arteries and arterioles, unspecified: Secondary | ICD-10-CM

## 2013-05-26 DIAGNOSIS — I10 Essential (primary) hypertension: Secondary | ICD-10-CM

## 2013-05-26 DIAGNOSIS — E7800 Pure hypercholesterolemia, unspecified: Secondary | ICD-10-CM

## 2013-05-26 DIAGNOSIS — I619 Nontraumatic intracerebral hemorrhage, unspecified: Secondary | ICD-10-CM

## 2013-05-26 NOTE — Assessment & Plan Note (Signed)
We know from the past that he cannot tolerate Lipitor or Crestor. Therefore simvastatin is the optimal medication for him at this time

## 2013-05-26 NOTE — Progress Notes (Signed)
HPI   Patient returns for followup of coronary disease. He is doing well. He is fully active. He's not having any significant symptoms. He underwent CABG in 2000. He had an acute lateral MI with a bare-metal stent in 2008. Catheterization 2009, stent was patent. There was some sluggish distal flow. Decision was made to keep him on aspirin and Plavix long term unless there were any difficulties. He remains on these with no problems.   Allergies  Allergen Reactions  . Morphine     Current Outpatient Prescriptions  Medication Sig Dispense Refill  . aspirin 81 MG tablet Take 81 mg by mouth daily.        . carvedilol (COREG) 3.125 MG tablet Take 3.125 mg by mouth 2 (two) times daily with a meal.        . clopidogrel (PLAVIX) 75 MG tablet Take 75 mg by mouth daily.        . diazepam (VALIUM) 5 MG tablet Take 5 mg by mouth every 6 (six) hours as needed.        . hydrochlorothiazide (HYDRODIURIL) 12.5 MG tablet Take 12.5 mg by mouth daily.        [name] losartan (COZAAR) 50 MG tablet Take 50 mg by mouth daily.        [name] omeprazole (PRILOSEC) 20 MG capsule Take 20 mg by mouth daily.        . simvastatin (ZOCOR) 40 MG tablet Take 40 mg by mouth at bedtime.         No current facility-administered medications for this visit.    History   Social History  . Marital Status: Married    Spouse Name: N/A    Number of Children: N/A  . Years of Education: N/A   Occupational History  . retired     Sport and exercise psychologist   Social History Main Topics  . Smoking status: Never Smoker   . Smokeless tobacco: Not on file  . Alcohol Use: No  . Drug Use: No  . Sexually Active: Not on file   Other Topics Concern  . Not on file   Social History Narrative  . No narrative on file    No family history on file.  Past Medical History  Diagnosis Date  . Coronary artery disease     Acute lateral MI October, 2008, bare-metal stent circumflex /  catheterization November, 2009, LIMA-LAD patent,, all  vein grafts patent OM stent patent... some sluggish distal flow... medical therapy  . Hypertension   . Hypercholesterolemia     Simvastatin started 2009  . Intracerebral bleed     right thalamic, 2004, despite this tolerates aspirin and Plavix  . TIA (transient ischemic attack) 2006  . GERD (gastroesophageal reflux disease)   . Hx of CABG     2000  . Ejection fraction     Low normal, inferior hypokinesis with some posterior lateral hypokinesis  . Carotid artery disease     Doppler, December, 2010, 0-39% bilateral    Past Surgical History  Procedure Laterality Date  . Coronary artery bypass graft  2000    Acute lateral MI and bare-metal stent circumflex..(10/08) Cath 10/14/08; LIMA to the LAD patent ; all vein grafts patent; OM stent patent..some sluggish distal flow.    Patient Active Problem List   Diagnosis Date Noted  . Coronary artery disease   . Hypertension   . Hypercholesterolemia   . Intracerebral bleed   . TIA (transient ischemic attack)   .  GERD (gastroesophageal reflux disease)   . Hx of CABG   . Ejection fraction   . Carotid artery disease     ROS   patient denies fever, chills, headache, sweats, rash, change in vision, change in hearing, chest pain, cough, nausea or vomiting, urinary symptoms. All other systems are reviewed and are negative.  PHYSICAL EXAM   patient looks great. He is oriented to person time and place. Affect is normal. There is no jugulovenous distention. Lungs are clear. Respiratory effort is nonlabored. Cardiac exam reveals S1 and S2. There no clicks or significant murmurs. The abdomen is soft. There is no peripheral edema.  Filed Vitals:   05/26/13 0917  BP: 130/80  Pulse: 76  Height: 5\' 8"  (1.727 m)  Weight: 195 lb (88.451 kg)  SpO2: 95%    EKG is done today and reviewed by me. There is no change from the past. There is normal sinus rhythm. There are nonspecific ST-T wave changes.   ASSESSMENT & PLAN

## 2013-05-26 NOTE — Assessment & Plan Note (Signed)
Coronary disease is stable. He does not need any testing at this time. He remains on aspirin and Plavix because of sluggish flow after his stent many years ago. He tolerates the medications. No change at this time. Certainly his Plavix can be stopped anytime if needed for procedure.

## 2013-05-26 NOTE — Assessment & Plan Note (Signed)
Blood pressures control. No change in therapy.  As part of his evaluation today I spent greater than 25 minutes with is total care. More than half of this time was with direct contact with him discussing all of the issues about his care.

## 2013-05-26 NOTE — Patient Instructions (Addendum)
Your physician recommends that you continue on your current medications as directed. Please refer to the Current Medication list given to you today.  Your physician wants you to follow-up in: 1 year. You will receive a reminder letter in the mail two months in advance. If you don't receive a letter, please call our office to schedule the follow-up appointment.  

## 2013-05-26 NOTE — Assessment & Plan Note (Signed)
Because of this history I may decide to stop his Plavix in the future.

## 2013-05-26 NOTE — Assessment & Plan Note (Signed)
Dopplers June, 2014 showed very stable minor disease. He can have followup in 2 years.

## 2013-09-03 ENCOUNTER — Encounter: Payer: Self-pay | Admitting: Cardiology

## 2013-09-03 ENCOUNTER — Ambulatory Visit
Admission: RE | Admit: 2013-09-03 | Discharge: 2013-09-03 | Disposition: A | Discharge status: Home / Self Care | Payer: Medicare PPO | Source: Ambulatory Visit | Attending: Family Medicine | Admitting: Family Medicine

## 2013-09-03 ENCOUNTER — Other Ambulatory Visit: Payer: Self-pay | Admitting: Family Medicine

## 2013-09-03 DIAGNOSIS — R198 Other specified symptoms and signs involving the digestive system and abdomen: Secondary | ICD-10-CM

## 2014-05-18 ENCOUNTER — Encounter: Payer: Self-pay | Admitting: Cardiology

## 2014-05-18 DIAGNOSIS — R9430 Abnormal result of cardiovascular function study, unspecified: Secondary | ICD-10-CM | POA: Insufficient documentation

## 2014-05-19 ENCOUNTER — Encounter (INDEPENDENT_AMBULATORY_CARE_PROVIDER_SITE_OTHER): Payer: Self-pay

## 2014-05-19 ENCOUNTER — Ambulatory Visit (INDEPENDENT_AMBULATORY_CARE_PROVIDER_SITE_OTHER): Payer: Medicare PPO | Admitting: Cardiology

## 2014-05-19 ENCOUNTER — Encounter: Payer: Self-pay | Admitting: Cardiology

## 2014-05-19 VITALS — BP 118/66 | HR 67 | Ht 68.0 in | Wt 194.0 lb

## 2014-05-19 DIAGNOSIS — I2510 Atherosclerotic heart disease of native coronary artery without angina pectoris: Secondary | ICD-10-CM

## 2014-05-19 DIAGNOSIS — I10 Essential (primary) hypertension: Secondary | ICD-10-CM

## 2014-05-19 DIAGNOSIS — E7800 Pure hypercholesterolemia, unspecified: Secondary | ICD-10-CM

## 2014-05-19 DIAGNOSIS — I739 Peripheral vascular disease, unspecified: Secondary | ICD-10-CM

## 2014-05-19 DIAGNOSIS — I779 Disorder of arteries and arterioles, unspecified: Secondary | ICD-10-CM

## 2014-05-19 NOTE — Patient Instructions (Signed)
**Note De-identified  Obfuscation** Your physician recommends that you continue on your current medications as directed. Please refer to the Current Medication list given to you today.  Your physician wants you to follow-up in: 1 year. You will receive a reminder letter in the mail two months in advance. If you don't receive a letter, please call our office to schedule the follow-up appointment.  

## 2014-05-19 NOTE — Assessment & Plan Note (Signed)
The patient does not tolerate Lipitor or Crestor. He is able to tolerate 40 mg of simvastatin. Therefore this dose will be continued.

## 2014-05-19 NOTE — Progress Notes (Signed)
Patient ID: Russell Hebert, male   DOB: November 20, 1946, 68 y.o.   MRN: [id number]    HPI  Patient is seen today for followup coronary artery disease. He's doing well. I saw him recently in the office when he brought his wife in. She has significant issues and these are being evaluated completely. He is stable. He's not having chest pain or shortness of breath. He takes his medications reliably. He has known coronary disease. He underwent CABG in 2000. He had an acute lateral MI with a bare-metal stent in 2008. In 2009 his stent was patent. There was some sluggish distal flow. We have kept him on aspirin and Plavix.  Allergies  Allergen Reactions  . Morphine     Current Outpatient Prescriptions  Medication Sig Dispense Refill  . aspirin 81 MG tablet Take 81 mg by mouth daily.        . carvedilol (COREG) 3.125 MG tablet Take 3.125 mg by mouth 2 (two) times daily with a meal.        . clopidogrel (PLAVIX) 75 MG tablet Take 75 mg by mouth daily.        . diazepam (VALIUM) 5 MG tablet Take 5 mg by mouth every 6 (six) hours as needed.        . hydrochlorothiazide (HYDRODIURIL) 12.5 MG tablet Take 12.5 mg by mouth daily.        [name] losartan (COZAAR) 50 MG tablet Take 50 mg by mouth daily.        [name] omeprazole (PRILOSEC) 20 MG capsule Take 20 mg by mouth daily.        . simvastatin (ZOCOR) 40 MG tablet Take 40 mg by mouth at bedtime.         No current facility-administered medications for this visit.    History   Social History  . Marital Status: Married    Spouse Name: N/A    Number of Children: N/A  . Years of Education: N/A   Occupational History  . retired     Information systems manager   Social History Main Topics  . Smoking status: Never Smoker   . Smokeless tobacco: Not on file  . Alcohol Use: No  . Drug Use: No  . Sexual Activity: Not on file   Other Topics Concern  . Not on file   Social History Narrative  . No narrative on file    No family history on file.  Past  Medical History  Diagnosis Date  . Coronary artery disease     Acute lateral MI October, 2008, bare-metal stent circumflex /  catheterization November, 2009, LIMA-LAD patent,, all vein grafts patent OM stent patent... some sluggish distal flow... medical therapy  . Hypertension   . Hypercholesterolemia     Simvastatin started 2009  . Intracerebral bleed     right thalamic, 2004, despite this tolerates aspirin and Plavix  . TIA (transient ischemic attack) 2006  . GERD (gastroesophageal reflux disease)   . Hx of CABG     2000  . Ejection fraction   . Carotid artery disease     Doppler, December, 2010, 0-39% bilateral    Past Surgical History  Procedure Laterality Date  . Coronary artery bypass graft  2000    Acute lateral MI and bare-metal stent circumflex..(10/08) Cath 10/14/08; LIMA to the LAD patent ; all vein grafts patent; OM stent patent..some sluggish distal flow.    Patient Active Problem List   Diagnosis Date Noted  . Ejection fraction   .  Coronary artery disease   . Hypertension   . Hypercholesterolemia   . Intracerebral bleed   . TIA (transient ischemic attack)   . GERD (gastroesophageal reflux disease)   . Hx of CABG   . Carotid artery disease     ROS   Patient denies fever, chills, headache, sweats, rash, change in vision, change in hearing, chest pain, cough, nausea or vomiting, urinary symptoms. All other systems are reviewed and are negative.  PHYSICAL EXAM  Patient looks good today. She is oriented to person time and place. Affect is normal. Head is atraumatic. Sclera and conjunctiva are normal. There is no jugulovenous distention. Lungs are clear. Respiratory effort is nonlabored. Cardiac exam reveals S1 and S2. The abdomen is soft. There is no peripheral edema. Femoral musculoskeletal deformities. There are no skin rashes.  Filed Vitals:   05/19/14 1202  BP: 118/66  Pulse: 67  Height: 5\' 8"  (1.727 m)  Weight: 194 lb (87.998 kg)   EKG is done today  and reviewed by me. There sinus rhythm with nonspecific ST-T wave abnormalities. There is no change when compared to the prior tracing.  ASSESSMENT & PLAN

## 2014-05-19 NOTE — Assessment & Plan Note (Signed)
His last Doppler was 2014. He had stable minor disease. 2 year followup was recommended at that time.

## 2014-05-19 NOTE — Assessment & Plan Note (Signed)
His coronary disease is stable. We will continue the same medications including aspirin and Plavix long-term.

## 2014-05-19 NOTE — Assessment & Plan Note (Signed)
Blood pressure is controlled. No change in therapy. 

## 2014-09-27 ENCOUNTER — Encounter: Payer: Self-pay | Admitting: Cardiology

## 2015-05-11 ENCOUNTER — Other Ambulatory Visit: Payer: Self-pay | Admitting: Cardiology

## 2015-05-11 DIAGNOSIS — I6523 Occlusion and stenosis of bilateral carotid arteries: Secondary | ICD-10-CM

## 2015-05-20 ENCOUNTER — Encounter (HOSPITAL_COMMUNITY): Payer: Commercial Managed Care - HMO

## 2015-05-23 ENCOUNTER — Ambulatory Visit (HOSPITAL_COMMUNITY): Payer: Commercial Managed Care - HMO | Attending: Cardiology

## 2015-05-23 DIAGNOSIS — I6523 Occlusion and stenosis of bilateral carotid arteries: Secondary | ICD-10-CM | POA: Insufficient documentation

## 2015-06-03 ENCOUNTER — Ambulatory Visit (INDEPENDENT_AMBULATORY_CARE_PROVIDER_SITE_OTHER): Payer: Commercial Managed Care - HMO | Admitting: Cardiology

## 2015-06-03 ENCOUNTER — Encounter: Payer: Self-pay | Admitting: Cardiology

## 2015-06-03 VITALS — BP 112/72 | HR 66 | Ht 68.0 in | Wt 193.2 lb

## 2015-06-03 DIAGNOSIS — I10 Essential (primary) hypertension: Secondary | ICD-10-CM | POA: Diagnosis not present

## 2015-06-03 DIAGNOSIS — I739 Peripheral vascular disease, unspecified: Secondary | ICD-10-CM

## 2015-06-03 DIAGNOSIS — I779 Disorder of arteries and arterioles, unspecified: Secondary | ICD-10-CM | POA: Diagnosis not present

## 2015-06-03 DIAGNOSIS — E7800 Pure hypercholesterolemia, unspecified: Secondary | ICD-10-CM

## 2015-06-03 DIAGNOSIS — I25810 Atherosclerosis of coronary artery bypass graft(s) without angina pectoris: Secondary | ICD-10-CM

## 2015-06-03 NOTE — Assessment & Plan Note (Signed)
Carotid disease is stable. He will have a 2 year follow-up.

## 2015-06-03 NOTE — Assessment & Plan Note (Signed)
Blood pressures controlled. No change in therapy. 

## 2015-06-03 NOTE — Patient Instructions (Signed)
**Note De-Identified  Obfuscation** Medication Instructions:  Same-no change  Labwork: None  Testing/Procedures: Your physician has requested that you have an echocardiogram. Echocardiography is a painless test that uses sound waves to create images of your heart. It provides your doctor with information about the size and shape of your heart and how well your heart's chambers and valves are working. This procedure takes approximately one hour. There are no restrictions for this procedure.    Follow-Up: Your physician wants you to follow-up in: 9 months with Dr Marlou Porch. You will receive a reminder letter in the mail two months in advance. If you don't receive a letter, please call our office to schedule the follow-up appointment.   Any Other Special Instructions Will Be Listed Below (If Applicable).

## 2015-06-03 NOTE — Progress Notes (Signed)
Cardiology Office Note   Date:  06/03/2015   ID:  Marshel, Golubski 02/19/1946, MRN [id number]  PCP:  Donnie Coffin, MD  Cardiologist:  Dola Argyle, MD   Chief Complaint  Patient presents with  . Appointment    Follow-up coronary artery disease      History of Present Illness: Russell Hebert is a 69 y.o. male who presents to follow-up coronary artery disease. I saw him last in June, 2015. I have seen him during the year when he comes in with his wife who has significant cardiac disease and end-stage renal failure. He has documented disease. He underwent bypass surgery in 2000. He had an acute lateral MI with a bare metal stent placed in 2008. In 2009 he underwent repeat catheterization. The stent was patent. He had some sluggish distal flow. We've kept him on aspirin and Plavix. He is doing well without any significant symptoms. He had a recent carotid Doppler in June, 2016. This showed stable minimal disease. He will have a follow-up in 2 years.  The patient is aware that I will be retiring at the end of September, 2016. I will ask Dr. Marlou Porch to follow the patient in the future.    Past Medical History  Diagnosis Date  . Coronary artery disease     Acute lateral MI October, 2008, bare-metal stent circumflex /  catheterization November, 2009, LIMA-LAD patent,, all vein grafts patent OM stent patent... some sluggish distal flow... medical therapy  . Hypertension   . Hypercholesterolemia     Simvastatin started 2009  . Intracerebral bleed     right thalamic, 2004, despite this tolerates aspirin and Plavix  . TIA (transient ischemic attack) 2006  . GERD (gastroesophageal reflux disease)   . Hx of CABG     2000  . Ejection fraction   . Carotid artery disease     Doppler, December, 2010, 0-39% bilateral    Past Surgical History  Procedure Laterality Date  . Coronary artery bypass graft  2000    Acute lateral MI and bare-metal stent circumflex..(10/08) Cath 10/14/08;  LIMA to the LAD patent ; all vein grafts patent; OM stent patent..some sluggish distal flow.    Patient Active Problem List   Diagnosis Date Noted  . Ejection fraction   . Coronary artery disease   . Hypertension   . Hypercholesterolemia   . Intracerebral bleed   . TIA (transient ischemic attack)   . GERD (gastroesophageal reflux disease)   . Hx of CABG   . Carotid artery disease       Current Outpatient Prescriptions  Medication Sig Dispense Refill  . aspirin 81 MG tablet Take 81 mg by mouth daily.      . carvedilol (COREG) 3.125 MG tablet Take 3.125 mg by mouth 2 (two) times daily with a meal.      . clopidogrel (PLAVIX) 75 MG tablet Take 75 mg by mouth daily.      . diazepam (VALIUM) 5 MG tablet Take 5 mg by mouth every 6 (six) hours as needed (for vertigo).     . hydrochlorothiazide (HYDRODIURIL) 12.5 MG tablet Take 12.5 mg by mouth daily.      [name] losartan (COZAAR) 50 MG tablet Take 50 mg by mouth daily.      [name] omeprazole (PRILOSEC) 20 MG capsule Take 20 mg by mouth daily.      . simvastatin (ZOCOR) 40 MG tablet Take 40 mg by mouth at bedtime.  No current facility-administered medications for this visit.    Allergies:   Morphine    Social History:  The patient  reports that he has never smoked. He does not have any smokeless tobacco history on file. He reports that he does not drink alcohol or use illicit drugs.   Family History:  The patient's  family history includes Heart disease in his mother; Heart failure in his father and mother.    ROS:  Please see the history of present illness.     Patient denies fever, chills, headache, sweats, rash, change in vision, change in hearing, chest pain, cough, nausea or vomiting, urinary symptoms. All other systems are reviewed and are negative.  PHYSICAL EXAM: VS:  BP 112/72 mmHg  Pulse 66  Ht 5\' 8"  (1.727 m)  Wt 193 lb 3.2 oz (87.635 kg)  BMI 29.38 kg/m2 , Patient is oriented to person time and place. Affect is normal.  He has no significant complaints. He is fatigued from 24/7 care of his wife. However, he has no complaints about this. Head is atraumatic. Sclera and conjunctiva are normal. There is no jugular venous distention. Lungs are clear. Respiratory effort is nonlabored. Cardiac exam reveals S1 and S2. Abdomen is soft. There is no peripheral edema. There are no musculoskeletal deformities. There are no skin rashes. Neurologic is grossly intact.  EKG:   EKG is done today and reviewed by me. There is sinus rhythm. There are old nonspecific ST-T wave changes. There is no significant change.   Recent Labs: No results found for requested labs within last 365 days.    Lipid Panel    Component Value Date/Time   CHOL 204* 01/18/2009 0940   TRIG 109 01/18/2009 0940   HDL 59.1 01/18/2009 0940   CHOLHDL 3.5 CALC 01/18/2009 0940   VLDL 22 01/18/2009 0940   LDLCALC  10/03/2007 0300    92        Total Cholesterol/HDL:CHD Risk Coronary Heart Disease Risk Table                     Men   Women  1/2 Average Risk   3.4   3.3   LDLDIRECT 121.3 01/18/2009 0940      Wt Readings from Last 3 Encounters:  06/03/15 193 lb 3.2 oz (87.635 kg)  05/19/14 194 lb (87.998 kg)  05/26/13 195 lb (88.451 kg)      Current medicines are reviewed  The patient understands his medications.   ASSESSMENT AND PLAN:

## 2015-06-03 NOTE — Assessment & Plan Note (Signed)
The patient had a right thalamic bleed in 2004. This stabilized with no significant residual. He tolerates his antiplatelets medications without difficulty.

## 2015-06-03 NOTE — Assessment & Plan Note (Signed)
The patient cannot tolerate Lipitor or Crestor. Therefore we keep him on simvastatin.

## 2015-06-03 NOTE — Assessment & Plan Note (Signed)
Coronary disease is stable. He is not having symptoms. He does not need an exercise test. His last cath was 2009. Echo will be done to reassess LV function to be sure that he does not need modifications of his medications.

## 2015-06-13 ENCOUNTER — Other Ambulatory Visit: Payer: Self-pay

## 2015-06-13 ENCOUNTER — Ambulatory Visit (HOSPITAL_COMMUNITY): Payer: Commercial Managed Care - HMO | Attending: Internal Medicine

## 2015-06-13 DIAGNOSIS — I071 Rheumatic tricuspid insufficiency: Secondary | ICD-10-CM | POA: Insufficient documentation

## 2015-06-13 DIAGNOSIS — I2510 Atherosclerotic heart disease of native coronary artery without angina pectoris: Secondary | ICD-10-CM | POA: Diagnosis present

## 2015-06-13 DIAGNOSIS — I25810 Atherosclerosis of coronary artery bypass graft(s) without angina pectoris: Secondary | ICD-10-CM

## 2015-06-13 DIAGNOSIS — I352 Nonrheumatic aortic (valve) stenosis with insufficiency: Secondary | ICD-10-CM | POA: Insufficient documentation

## 2015-09-15 DIAGNOSIS — M2550 Pain in unspecified joint: Secondary | ICD-10-CM | POA: Diagnosis not present

## 2015-10-12 DIAGNOSIS — M2550 Pain in unspecified joint: Secondary | ICD-10-CM | POA: Diagnosis not present

## 2015-10-12 DIAGNOSIS — I10 Essential (primary) hypertension: Secondary | ICD-10-CM | POA: Diagnosis not present

## 2015-10-12 DIAGNOSIS — E7800 Pure hypercholesterolemia, unspecified: Secondary | ICD-10-CM | POA: Diagnosis not present

## 2015-10-12 DIAGNOSIS — Z0000 Encounter for general adult medical examination without abnormal findings: Secondary | ICD-10-CM | POA: Diagnosis not present

## 2015-10-12 DIAGNOSIS — Z1211 Encounter for screening for malignant neoplasm of colon: Secondary | ICD-10-CM | POA: Diagnosis not present

## 2015-10-12 DIAGNOSIS — K219 Gastro-esophageal reflux disease without esophagitis: Secondary | ICD-10-CM | POA: Diagnosis not present

## 2015-10-12 DIAGNOSIS — I679 Cerebrovascular disease, unspecified: Secondary | ICD-10-CM | POA: Diagnosis not present

## 2015-10-12 DIAGNOSIS — Z125 Encounter for screening for malignant neoplasm of prostate: Secondary | ICD-10-CM | POA: Diagnosis not present

## 2015-10-18 ENCOUNTER — Encounter: Payer: Self-pay | Admitting: Cardiology

## 2015-10-18 DIAGNOSIS — Z1211 Encounter for screening for malignant neoplasm of colon: Secondary | ICD-10-CM | POA: Diagnosis not present

## 2015-10-26 DIAGNOSIS — M353 Polymyalgia rheumatica: Secondary | ICD-10-CM | POA: Diagnosis not present

## 2015-10-26 DIAGNOSIS — M150 Primary generalized (osteo)arthritis: Secondary | ICD-10-CM | POA: Diagnosis not present

## 2015-10-26 DIAGNOSIS — M2550 Pain in unspecified joint: Secondary | ICD-10-CM | POA: Diagnosis not present

## 2015-10-26 DIAGNOSIS — R5383 Other fatigue: Secondary | ICD-10-CM | POA: Diagnosis not present

## 2015-10-26 DIAGNOSIS — K5229 Other allergic and dietetic gastroenteritis and colitis: Secondary | ICD-10-CM | POA: Diagnosis not present

## 2015-10-26 DIAGNOSIS — R21 Rash and other nonspecific skin eruption: Secondary | ICD-10-CM | POA: Diagnosis not present

## 2015-11-23 DIAGNOSIS — M353 Polymyalgia rheumatica: Secondary | ICD-10-CM | POA: Diagnosis not present

## 2015-11-23 DIAGNOSIS — M150 Primary generalized (osteo)arthritis: Secondary | ICD-10-CM | POA: Diagnosis not present

## 2015-11-23 DIAGNOSIS — M2550 Pain in unspecified joint: Secondary | ICD-10-CM | POA: Diagnosis not present

## 2015-12-21 DIAGNOSIS — M2550 Pain in unspecified joint: Secondary | ICD-10-CM | POA: Diagnosis not present

## 2015-12-21 DIAGNOSIS — M150 Primary generalized (osteo)arthritis: Secondary | ICD-10-CM | POA: Diagnosis not present

## 2015-12-21 DIAGNOSIS — M353 Polymyalgia rheumatica: Secondary | ICD-10-CM | POA: Diagnosis not present

## 2015-12-21 DIAGNOSIS — R21 Rash and other nonspecific skin eruption: Secondary | ICD-10-CM | POA: Diagnosis not present

## 2015-12-21 DIAGNOSIS — L603 Nail dystrophy: Secondary | ICD-10-CM | POA: Diagnosis not present

## 2015-12-21 DIAGNOSIS — Z7952 Long term (current) use of systemic steroids: Secondary | ICD-10-CM | POA: Diagnosis not present

## 2016-01-16 DIAGNOSIS — M2550 Pain in unspecified joint: Secondary | ICD-10-CM | POA: Diagnosis not present

## 2016-01-16 DIAGNOSIS — M150 Primary generalized (osteo)arthritis: Secondary | ICD-10-CM | POA: Diagnosis not present

## 2016-01-16 DIAGNOSIS — M353 Polymyalgia rheumatica: Secondary | ICD-10-CM | POA: Diagnosis not present

## 2016-02-08 ENCOUNTER — Encounter: Payer: Self-pay | Admitting: Cardiology

## 2016-02-08 ENCOUNTER — Ambulatory Visit (INDEPENDENT_AMBULATORY_CARE_PROVIDER_SITE_OTHER): Payer: Commercial Managed Care - HMO | Admitting: Cardiology

## 2016-02-08 VITALS — BP 118/80 | HR 64 | Ht 68.0 in | Wt 200.2 lb

## 2016-02-08 DIAGNOSIS — I779 Disorder of arteries and arterioles, unspecified: Secondary | ICD-10-CM

## 2016-02-08 DIAGNOSIS — Z951 Presence of aortocoronary bypass graft: Secondary | ICD-10-CM | POA: Diagnosis not present

## 2016-02-08 DIAGNOSIS — E7800 Pure hypercholesterolemia, unspecified: Secondary | ICD-10-CM | POA: Diagnosis not present

## 2016-02-08 DIAGNOSIS — I2583 Coronary atherosclerosis due to lipid rich plaque: Principal | ICD-10-CM

## 2016-02-08 DIAGNOSIS — I618 Other nontraumatic intracerebral hemorrhage: Secondary | ICD-10-CM

## 2016-02-08 DIAGNOSIS — I2510 Atherosclerotic heart disease of native coronary artery without angina pectoris: Secondary | ICD-10-CM | POA: Diagnosis not present

## 2016-02-08 DIAGNOSIS — I739 Peripheral vascular disease, unspecified: Secondary | ICD-10-CM

## 2016-02-08 DIAGNOSIS — M353 Polymyalgia rheumatica: Secondary | ICD-10-CM | POA: Insufficient documentation

## 2016-02-08 NOTE — Progress Notes (Signed)
Cardiology Office Note    Date:  02/08/2016   ID:  Canelo, Gutman 07-19-1946, MRN [id number]  PCP:  Donnie Coffin, MD  Cardiologist:   Candee Furbish, MD (former Ron Parker)    History of Present Illness:  Russell Hebert is a 70 y.o. male with CAD post CABG in 2000 with subsequent lateral MI BMS 2008 to circumflex, repeat cath 2009 stent was patent here for follow up. 3 total stents.   Prior right thalamic ICH in 2004, no recurrent issues.   Carotid artery mild disease bilaterally, stable. Doppler June 2016.   Taking care of wife with ESRD. I take care of her a well.   Prior statin intolerance to Lipitor and Crestor. Tolerates simvastatin.   Saw Dr. Lenna Gilford, [place]. Getting prednisone.     Past Medical History  Diagnosis Date  . Coronary artery disease     Acute lateral MI October, 2008, bare-metal stent circumflex /  catheterization November, 2009, LIMA-LAD patent,, all vein grafts patent OM stent patent... some sluggish distal flow... medical therapy  . Hypertension   . Hypercholesterolemia     Simvastatin started 2009  . Intracerebral bleed ([place])     right thalamic, 2004, despite this tolerates aspirin and Plavix  . TIA (transient ischemic attack) 2006  . GERD (gastroesophageal reflux disease)   . Hx of CABG     2000  . Ejection fraction   . Carotid artery disease ([place])     Doppler, December, 2010, 0-39% bilateral    Past Surgical History  Procedure Laterality Date  . Coronary artery bypass graft  2000    Acute lateral MI and bare-metal stent circumflex..(10/08) Cath 10/14/08; LIMA to the LAD patent ; all vein grafts patent; OM stent patent..some sluggish distal flow.    Outpatient Prescriptions Prior to Visit  Medication Sig Dispense Refill  . aspirin 81 MG tablet Take 81 mg by mouth daily.      . carvedilol (COREG) 3.125 MG tablet Take 3.125 mg by mouth 2 (two) times daily with a meal.      . clopidogrel (PLAVIX) 75 MG tablet Take 75 mg by mouth daily.      .  diazepam (VALIUM) 5 MG tablet Take 5 mg by mouth every 6 (six) hours as needed (for vertigo).     . hydrochlorothiazide (HYDRODIURIL) 12.5 MG tablet Take 12.5 mg by mouth daily.      [name] losartan (COZAAR) 50 MG tablet Take 50 mg by mouth daily.      [name] omeprazole (PRILOSEC) 20 MG capsule Take 20 mg by mouth daily.      . simvastatin (ZOCOR) 40 MG tablet Take 40 mg by mouth at bedtime.       No facility-administered medications prior to visit.     Allergies:   Morphine   Social History   Social History  . Marital Status: Married    Spouse Name: N/A  . Number of Children: N/A  . Years of Education: N/A   Occupational History  . retired     Information systems manager   Social History Main Topics  . Smoking status: Never Smoker   . Smokeless tobacco: None  . Alcohol Use: No  . Drug Use: No  . Sexual Activity: Not Asked   Other Topics Concern  . None   Social History Narrative     Family History:  The patient's family history includes Heart disease in his mother; Heart failure in his father and mother.  ROS:   Please see the history of present illness.    ROS Fatigue. Care of wife All other systems reviewed and are negative.   PHYSICAL EXAM:   VS:  BP 118/80 mmHg  Pulse 64  Ht 5\' 8"  (1.727 m)  Wt 200 lb 3.2 oz (90.81 kg)  BMI 30.45 kg/m2   GEN: Well nourished, well developed, in no acute distress HEENT: normal Neck: no JVD, carotid bruits, or masses Cardiac: RRR; no murmurs, rubs, or gallops,no edema  Respiratory:  clear to auscultation bilaterally, normal work of breathing GI: soft, nontender, nondistended, + BS MS: no deformity or atrophy Skin: warm and dry, no rash, trace edema Neuro:  Alert and Oriented x 3, Strength and sensation are intact Psych: euthymic mood, full affect  Wt Readings from Last 3 Encounters:  02/08/16 200 lb 3.2 oz (90.81 kg)  06/03/15 193 lb 3.2 oz (87.635 kg)  05/19/14 194 lb (87.998 kg)      Studies/Labs Reviewed:   EKG:  EKG  is not ordered today. Prior with NSR, NSSTW changes. Personally viewed.   Recent Labs: No results found for requested labs within last 365 days.   Lipid Panel    Component Value Date/Time   CHOL 204* 01/18/2009 0940   TRIG 109 01/18/2009 0940   HDL 59.1 01/18/2009 0940   CHOLHDL 3.5 CALC 01/18/2009 0940   VLDL 22 01/18/2009 0940   LDLCALC  10/03/2007 0300    92        Total Cholesterol/HDL:CHD Risk Coronary Heart Disease Risk Table                     Men   Women  1/2 Average Risk   3.4   3.3   LDLDIRECT 121.3 01/18/2009 0940    Additional studies/ records that were reviewed today include:  Prior office notes, labs, tests  ECHO 06/13/15: - LVEF 55-60%, normal wall thickness, normal wall motion, diastolicdysfunction, indeterminate LV Filling pressure, aortic valvesclerosis with trivial AI, top normal aortic root size at 3.9 cm. Upper normal LA size. Mild TR, normal RVSP.  Carotid Dopplers 05/23/15: Heterogeneous plaque, bilaterally. Stable 1-39% bilateral ICA stenosis. Normal subclavian arteries, bilaterally. Patent vertebral arteries with antegrade flow. f/u 2 years.  ASSESSMENT:    1. Coronary artery disease due to lipid rich plaque   2. Hx of CABG   3. Hypercholesterolemia   4. Other right-sided nontraumatic intracerebral hemorrhage ([place])   5. Bilateral carotid artery disease ([place])   6. PMR (polymyalgia rheumatica) ([facility])      PLAN:  In order of problems listed above:  CAD -post CABG stable with no anginal symptoms -secondary prevention  Hyperlipidemia -tolerating simvastatin.  -Had statin intolerance to Lipitor and Crestor.  Prior ICH right thalamic 2004 -no residual issues. Tolerates ASA, Plavix  Hypertension, essential -stable, meds reviewed.  Bilateral carotid artery disease -mild. 2016 Doppler reviewed. Repeat in 2018  Polymyalgia rheumatica -Prednisone, Dr. Lenna Gilford. He is feeling better. At first, he is having trouble getting up from a  seated position.  Medication Adjustments/Labs and Tests Ordered: Current medicines are reviewed at length with the patient today.  Concerns regarding medicines are outlined above.  Medication changes, Labs and Tests ordered today are listed in the Patient Instructions below. Patient Instructions  Medication Instructions:  Your physician recommends that you continue on your current medications as directed. Please refer to the Current Medication list given to you today.   Labwork: NONE  Testing/Procedures: NONE  Follow-Up: Your physician  wants you to follow-up in: 6 MONTHS  WITH  DR  Marlou Porch   MAKE  APPT  SAME DAY  AS  WIFE  You will receive a reminder letter in the mail two months in advance. If you don't receive a letter, please call our office to schedule the follow-up appointment.  Any Other Special Instructions Will Be Listed Below (If Applicable).     If you need a refill on your cardiac medications before your next appointment, please call your pharmacy.         Bobby Rumpf, MD  02/08/2016 8:54 AM    [address], [place], [place]  [address] Phone: [phone]; Fax: [phone]

## 2016-02-08 NOTE — Patient Instructions (Signed)
Medication Instructions:  Your physician recommends that you continue on your current medications as directed. Please refer to the Current Medication list given to you today.   Labwork: NONE  Testing/Procedures: NONE  Follow-Up: Your physician wants you to follow-up in: 6 MONTHS  WITH  DR  Marlou Porch   MAKE  APPT  SAME DAY  AS  WIFE  You will receive a reminder letter in the mail two months in advance. If you don't receive a letter, please call our office to schedule the follow-up appointment.  Any Other Special Instructions Will Be Listed Below (If Applicable).     If you need a refill on your cardiac medications before your next appointment, please call your pharmacy.

## 2016-03-14 DIAGNOSIS — M353 Polymyalgia rheumatica: Secondary | ICD-10-CM | POA: Diagnosis not present

## 2016-03-14 DIAGNOSIS — M2550 Pain in unspecified joint: Secondary | ICD-10-CM | POA: Diagnosis not present

## 2016-03-14 DIAGNOSIS — Z7952 Long term (current) use of systemic steroids: Secondary | ICD-10-CM | POA: Diagnosis not present

## 2016-03-14 DIAGNOSIS — M150 Primary generalized (osteo)arthritis: Secondary | ICD-10-CM | POA: Diagnosis not present

## 2016-04-17 DIAGNOSIS — K219 Gastro-esophageal reflux disease without esophagitis: Secondary | ICD-10-CM | POA: Diagnosis not present

## 2016-04-17 DIAGNOSIS — I10 Essential (primary) hypertension: Secondary | ICD-10-CM | POA: Diagnosis not present

## 2016-04-17 DIAGNOSIS — E7800 Pure hypercholesterolemia, unspecified: Secondary | ICD-10-CM | POA: Diagnosis not present

## 2016-04-17 DIAGNOSIS — I679 Cerebrovascular disease, unspecified: Secondary | ICD-10-CM | POA: Diagnosis not present

## 2016-04-24 ENCOUNTER — Encounter: Payer: Self-pay | Admitting: Cardiology

## 2016-05-23 DIAGNOSIS — Z7952 Long term (current) use of systemic steroids: Secondary | ICD-10-CM | POA: Diagnosis not present

## 2016-05-23 DIAGNOSIS — M353 Polymyalgia rheumatica: Secondary | ICD-10-CM | POA: Diagnosis not present

## 2016-05-23 DIAGNOSIS — M2550 Pain in unspecified joint: Secondary | ICD-10-CM | POA: Diagnosis not present

## 2016-05-23 DIAGNOSIS — M150 Primary generalized (osteo)arthritis: Secondary | ICD-10-CM | POA: Diagnosis not present

## 2016-06-20 DIAGNOSIS — M2550 Pain in unspecified joint: Secondary | ICD-10-CM | POA: Diagnosis not present

## 2016-06-20 DIAGNOSIS — M150 Primary generalized (osteo)arthritis: Secondary | ICD-10-CM | POA: Diagnosis not present

## 2016-06-20 DIAGNOSIS — Z7952 Long term (current) use of systemic steroids: Secondary | ICD-10-CM | POA: Diagnosis not present

## 2016-06-20 DIAGNOSIS — M353 Polymyalgia rheumatica: Secondary | ICD-10-CM | POA: Diagnosis not present

## 2016-07-18 DIAGNOSIS — M2550 Pain in unspecified joint: Secondary | ICD-10-CM | POA: Diagnosis not present

## 2016-07-18 DIAGNOSIS — Z7952 Long term (current) use of systemic steroids: Secondary | ICD-10-CM | POA: Diagnosis not present

## 2016-07-18 DIAGNOSIS — M353 Polymyalgia rheumatica: Secondary | ICD-10-CM | POA: Diagnosis not present

## 2016-07-18 DIAGNOSIS — M150 Primary generalized (osteo)arthritis: Secondary | ICD-10-CM | POA: Diagnosis not present

## 2016-08-08 DIAGNOSIS — M353 Polymyalgia rheumatica: Secondary | ICD-10-CM | POA: Diagnosis not present

## 2016-09-12 DIAGNOSIS — Z7952 Long term (current) use of systemic steroids: Secondary | ICD-10-CM | POA: Diagnosis not present

## 2016-09-12 DIAGNOSIS — M150 Primary generalized (osteo)arthritis: Secondary | ICD-10-CM | POA: Diagnosis not present

## 2016-09-12 DIAGNOSIS — L409 Psoriasis, unspecified: Secondary | ICD-10-CM | POA: Diagnosis not present

## 2016-09-12 DIAGNOSIS — M2550 Pain in unspecified joint: Secondary | ICD-10-CM | POA: Diagnosis not present

## 2016-09-12 DIAGNOSIS — M353 Polymyalgia rheumatica: Secondary | ICD-10-CM | POA: Diagnosis not present

## 2016-11-14 DIAGNOSIS — E663 Overweight: Secondary | ICD-10-CM | POA: Diagnosis not present

## 2016-11-14 DIAGNOSIS — M150 Primary generalized (osteo)arthritis: Secondary | ICD-10-CM | POA: Diagnosis not present

## 2016-11-14 DIAGNOSIS — M2550 Pain in unspecified joint: Secondary | ICD-10-CM | POA: Diagnosis not present

## 2016-11-14 DIAGNOSIS — Z6830 Body mass index (BMI) 30.0-30.9, adult: Secondary | ICD-10-CM | POA: Diagnosis not present

## 2016-11-14 DIAGNOSIS — M353 Polymyalgia rheumatica: Secondary | ICD-10-CM | POA: Diagnosis not present

## 2016-12-12 DIAGNOSIS — M150 Primary generalized (osteo)arthritis: Secondary | ICD-10-CM | POA: Diagnosis not present

## 2016-12-12 DIAGNOSIS — E663 Overweight: Secondary | ICD-10-CM | POA: Diagnosis not present

## 2016-12-12 DIAGNOSIS — M353 Polymyalgia rheumatica: Secondary | ICD-10-CM | POA: Diagnosis not present

## 2016-12-12 DIAGNOSIS — Z6830 Body mass index (BMI) 30.0-30.9, adult: Secondary | ICD-10-CM | POA: Diagnosis not present

## 2016-12-12 DIAGNOSIS — M2550 Pain in unspecified joint: Secondary | ICD-10-CM | POA: Diagnosis not present

## 2016-12-12 DIAGNOSIS — E669 Obesity, unspecified: Secondary | ICD-10-CM | POA: Diagnosis not present

## 2017-01-23 DIAGNOSIS — M150 Primary generalized (osteo)arthritis: Secondary | ICD-10-CM | POA: Diagnosis not present

## 2017-01-23 DIAGNOSIS — M353 Polymyalgia rheumatica: Secondary | ICD-10-CM | POA: Diagnosis not present

## 2017-01-23 DIAGNOSIS — E663 Overweight: Secondary | ICD-10-CM | POA: Diagnosis not present

## 2017-01-23 DIAGNOSIS — Z6830 Body mass index (BMI) 30.0-30.9, adult: Secondary | ICD-10-CM | POA: Diagnosis not present

## 2017-01-23 DIAGNOSIS — M2550 Pain in unspecified joint: Secondary | ICD-10-CM | POA: Diagnosis not present

## 2017-01-23 DIAGNOSIS — L4050 Arthropathic psoriasis, unspecified: Secondary | ICD-10-CM | POA: Diagnosis not present

## 2017-01-25 DIAGNOSIS — Z1211 Encounter for screening for malignant neoplasm of colon: Secondary | ICD-10-CM | POA: Diagnosis not present

## 2017-01-25 DIAGNOSIS — I679 Cerebrovascular disease, unspecified: Secondary | ICD-10-CM | POA: Diagnosis not present

## 2017-01-25 DIAGNOSIS — K219 Gastro-esophageal reflux disease without esophagitis: Secondary | ICD-10-CM | POA: Diagnosis not present

## 2017-01-25 DIAGNOSIS — I10 Essential (primary) hypertension: Secondary | ICD-10-CM | POA: Diagnosis not present

## 2017-01-25 DIAGNOSIS — E7800 Pure hypercholesterolemia, unspecified: Secondary | ICD-10-CM | POA: Diagnosis not present

## 2017-01-25 DIAGNOSIS — Z0000 Encounter for general adult medical examination without abnormal findings: Secondary | ICD-10-CM | POA: Diagnosis not present

## 2017-01-29 DIAGNOSIS — Z1211 Encounter for screening for malignant neoplasm of colon: Secondary | ICD-10-CM | POA: Diagnosis not present

## 2017-03-04 DIAGNOSIS — Z6829 Body mass index (BMI) 29.0-29.9, adult: Secondary | ICD-10-CM | POA: Diagnosis not present

## 2017-03-04 DIAGNOSIS — L4050 Arthropathic psoriasis, unspecified: Secondary | ICD-10-CM | POA: Diagnosis not present

## 2017-03-04 DIAGNOSIS — M150 Primary generalized (osteo)arthritis: Secondary | ICD-10-CM | POA: Diagnosis not present

## 2017-03-04 DIAGNOSIS — M353 Polymyalgia rheumatica: Secondary | ICD-10-CM | POA: Diagnosis not present

## 2017-03-04 DIAGNOSIS — L409 Psoriasis, unspecified: Secondary | ICD-10-CM | POA: Diagnosis not present

## 2017-03-04 DIAGNOSIS — E663 Overweight: Secondary | ICD-10-CM | POA: Diagnosis not present

## 2017-03-04 DIAGNOSIS — M2550 Pain in unspecified joint: Secondary | ICD-10-CM | POA: Diagnosis not present

## 2017-04-15 DIAGNOSIS — M2550 Pain in unspecified joint: Secondary | ICD-10-CM | POA: Diagnosis not present

## 2017-04-15 DIAGNOSIS — M353 Polymyalgia rheumatica: Secondary | ICD-10-CM | POA: Diagnosis not present

## 2017-04-15 DIAGNOSIS — E663 Overweight: Secondary | ICD-10-CM | POA: Diagnosis not present

## 2017-04-15 DIAGNOSIS — M150 Primary generalized (osteo)arthritis: Secondary | ICD-10-CM | POA: Diagnosis not present

## 2017-04-15 DIAGNOSIS — Z6829 Body mass index (BMI) 29.0-29.9, adult: Secondary | ICD-10-CM | POA: Diagnosis not present

## 2017-04-15 DIAGNOSIS — Z79899 Other long term (current) drug therapy: Secondary | ICD-10-CM | POA: Diagnosis not present

## 2017-04-15 DIAGNOSIS — L4050 Arthropathic psoriasis, unspecified: Secondary | ICD-10-CM | POA: Diagnosis not present

## 2017-04-15 DIAGNOSIS — M0579 Rheumatoid arthritis with rheumatoid factor of multiple sites without organ or systems involvement: Secondary | ICD-10-CM | POA: Diagnosis not present

## 2017-04-15 DIAGNOSIS — R21 Rash and other nonspecific skin eruption: Secondary | ICD-10-CM | POA: Diagnosis not present

## 2017-04-15 DIAGNOSIS — L409 Psoriasis, unspecified: Secondary | ICD-10-CM | POA: Diagnosis not present

## 2017-05-15 DIAGNOSIS — M353 Polymyalgia rheumatica: Secondary | ICD-10-CM | POA: Diagnosis not present

## 2017-05-15 DIAGNOSIS — E663 Overweight: Secondary | ICD-10-CM | POA: Diagnosis not present

## 2017-05-15 DIAGNOSIS — Z79899 Other long term (current) drug therapy: Secondary | ICD-10-CM | POA: Diagnosis not present

## 2017-05-15 DIAGNOSIS — M150 Primary generalized (osteo)arthritis: Secondary | ICD-10-CM | POA: Diagnosis not present

## 2017-05-15 DIAGNOSIS — R21 Rash and other nonspecific skin eruption: Secondary | ICD-10-CM | POA: Diagnosis not present

## 2017-05-15 DIAGNOSIS — Z6829 Body mass index (BMI) 29.0-29.9, adult: Secondary | ICD-10-CM | POA: Diagnosis not present

## 2017-05-15 DIAGNOSIS — M2550 Pain in unspecified joint: Secondary | ICD-10-CM | POA: Diagnosis not present

## 2017-05-15 DIAGNOSIS — L4050 Arthropathic psoriasis, unspecified: Secondary | ICD-10-CM | POA: Diagnosis not present

## 2017-05-15 DIAGNOSIS — L409 Psoriasis, unspecified: Secondary | ICD-10-CM | POA: Diagnosis not present

## 2017-06-19 DIAGNOSIS — B352 Tinea manuum: Secondary | ICD-10-CM | POA: Diagnosis not present

## 2017-06-19 DIAGNOSIS — D692 Other nonthrombocytopenic purpura: Secondary | ICD-10-CM | POA: Diagnosis not present

## 2017-06-19 DIAGNOSIS — B351 Tinea unguium: Secondary | ICD-10-CM | POA: Diagnosis not present

## 2017-06-19 DIAGNOSIS — B353 Tinea pedis: Secondary | ICD-10-CM | POA: Diagnosis not present

## 2017-06-19 DIAGNOSIS — D225 Melanocytic nevi of trunk: Secondary | ICD-10-CM | POA: Diagnosis not present

## 2017-06-26 DIAGNOSIS — L4050 Arthropathic psoriasis, unspecified: Secondary | ICD-10-CM | POA: Diagnosis not present

## 2017-06-26 DIAGNOSIS — Z79899 Other long term (current) drug therapy: Secondary | ICD-10-CM | POA: Diagnosis not present

## 2017-06-26 DIAGNOSIS — R21 Rash and other nonspecific skin eruption: Secondary | ICD-10-CM | POA: Diagnosis not present

## 2017-06-26 DIAGNOSIS — E663 Overweight: Secondary | ICD-10-CM | POA: Diagnosis not present

## 2017-06-26 DIAGNOSIS — M150 Primary generalized (osteo)arthritis: Secondary | ICD-10-CM | POA: Diagnosis not present

## 2017-06-26 DIAGNOSIS — Z6829 Body mass index (BMI) 29.0-29.9, adult: Secondary | ICD-10-CM | POA: Diagnosis not present

## 2017-06-26 DIAGNOSIS — M2550 Pain in unspecified joint: Secondary | ICD-10-CM | POA: Diagnosis not present

## 2017-06-26 DIAGNOSIS — L409 Psoriasis, unspecified: Secondary | ICD-10-CM | POA: Diagnosis not present

## 2017-06-26 DIAGNOSIS — M353 Polymyalgia rheumatica: Secondary | ICD-10-CM | POA: Diagnosis not present

## 2017-07-24 DIAGNOSIS — M2550 Pain in unspecified joint: Secondary | ICD-10-CM | POA: Diagnosis not present

## 2017-07-24 DIAGNOSIS — L4050 Arthropathic psoriasis, unspecified: Secondary | ICD-10-CM | POA: Diagnosis not present

## 2017-07-24 DIAGNOSIS — Z6829 Body mass index (BMI) 29.0-29.9, adult: Secondary | ICD-10-CM | POA: Diagnosis not present

## 2017-07-24 DIAGNOSIS — M150 Primary generalized (osteo)arthritis: Secondary | ICD-10-CM | POA: Diagnosis not present

## 2017-07-24 DIAGNOSIS — Z79899 Other long term (current) drug therapy: Secondary | ICD-10-CM | POA: Diagnosis not present

## 2017-07-24 DIAGNOSIS — E663 Overweight: Secondary | ICD-10-CM | POA: Diagnosis not present

## 2017-07-24 DIAGNOSIS — M353 Polymyalgia rheumatica: Secondary | ICD-10-CM | POA: Diagnosis not present

## 2017-10-09 DIAGNOSIS — M2550 Pain in unspecified joint: Secondary | ICD-10-CM | POA: Diagnosis not present

## 2017-10-09 DIAGNOSIS — Z79899 Other long term (current) drug therapy: Secondary | ICD-10-CM | POA: Diagnosis not present

## 2017-10-09 DIAGNOSIS — M150 Primary generalized (osteo)arthritis: Secondary | ICD-10-CM | POA: Diagnosis not present

## 2017-10-09 DIAGNOSIS — L4050 Arthropathic psoriasis, unspecified: Secondary | ICD-10-CM | POA: Diagnosis not present

## 2017-10-09 DIAGNOSIS — M353 Polymyalgia rheumatica: Secondary | ICD-10-CM | POA: Diagnosis not present

## 2017-10-09 DIAGNOSIS — Z6829 Body mass index (BMI) 29.0-29.9, adult: Secondary | ICD-10-CM | POA: Diagnosis not present

## 2017-10-09 DIAGNOSIS — E663 Overweight: Secondary | ICD-10-CM | POA: Diagnosis not present

## 2018-01-10 DIAGNOSIS — M542 Cervicalgia: Secondary | ICD-10-CM | POA: Diagnosis not present

## 2018-01-10 DIAGNOSIS — M353 Polymyalgia rheumatica: Secondary | ICD-10-CM | POA: Diagnosis not present

## 2018-01-10 DIAGNOSIS — L4050 Arthropathic psoriasis, unspecified: Secondary | ICD-10-CM | POA: Diagnosis not present

## 2018-01-10 DIAGNOSIS — Z79899 Other long term (current) drug therapy: Secondary | ICD-10-CM | POA: Diagnosis not present

## 2018-01-10 DIAGNOSIS — M2550 Pain in unspecified joint: Secondary | ICD-10-CM | POA: Diagnosis not present

## 2018-01-10 DIAGNOSIS — Z6830 Body mass index (BMI) 30.0-30.9, adult: Secondary | ICD-10-CM | POA: Diagnosis not present

## 2018-01-10 DIAGNOSIS — M150 Primary generalized (osteo)arthritis: Secondary | ICD-10-CM | POA: Diagnosis not present

## 2018-01-10 DIAGNOSIS — E669 Obesity, unspecified: Secondary | ICD-10-CM | POA: Diagnosis not present

## 2018-01-10 DIAGNOSIS — M5030 Other cervical disc degeneration, unspecified cervical region: Secondary | ICD-10-CM | POA: Diagnosis not present

## 2018-02-19 DIAGNOSIS — I10 Essential (primary) hypertension: Secondary | ICD-10-CM | POA: Diagnosis not present

## 2018-02-19 DIAGNOSIS — E7800 Pure hypercholesterolemia, unspecified: Secondary | ICD-10-CM | POA: Diagnosis not present

## 2018-02-19 DIAGNOSIS — I679 Cerebrovascular disease, unspecified: Secondary | ICD-10-CM | POA: Diagnosis not present

## 2018-02-19 DIAGNOSIS — K219 Gastro-esophageal reflux disease without esophagitis: Secondary | ICD-10-CM | POA: Diagnosis not present

## 2018-02-19 DIAGNOSIS — Z1159 Encounter for screening for other viral diseases: Secondary | ICD-10-CM | POA: Diagnosis not present

## 2018-02-19 DIAGNOSIS — Z1211 Encounter for screening for malignant neoplasm of colon: Secondary | ICD-10-CM | POA: Diagnosis not present

## 2018-02-19 DIAGNOSIS — Z0000 Encounter for general adult medical examination without abnormal findings: Secondary | ICD-10-CM | POA: Diagnosis not present

## 2018-02-21 DIAGNOSIS — Z79899 Other long term (current) drug therapy: Secondary | ICD-10-CM | POA: Diagnosis not present

## 2018-02-21 DIAGNOSIS — M150 Primary generalized (osteo)arthritis: Secondary | ICD-10-CM | POA: Diagnosis not present

## 2018-02-21 DIAGNOSIS — M353 Polymyalgia rheumatica: Secondary | ICD-10-CM | POA: Diagnosis not present

## 2018-02-21 DIAGNOSIS — M2550 Pain in unspecified joint: Secondary | ICD-10-CM | POA: Diagnosis not present

## 2018-02-21 DIAGNOSIS — L4050 Arthropathic psoriasis, unspecified: Secondary | ICD-10-CM | POA: Diagnosis not present

## 2018-02-21 DIAGNOSIS — Z6829 Body mass index (BMI) 29.0-29.9, adult: Secondary | ICD-10-CM | POA: Diagnosis not present

## 2018-02-21 DIAGNOSIS — E663 Overweight: Secondary | ICD-10-CM | POA: Diagnosis not present

## 2018-04-23 DIAGNOSIS — M353 Polymyalgia rheumatica: Secondary | ICD-10-CM | POA: Diagnosis not present

## 2018-04-23 DIAGNOSIS — E663 Overweight: Secondary | ICD-10-CM | POA: Diagnosis not present

## 2018-04-23 DIAGNOSIS — M2550 Pain in unspecified joint: Secondary | ICD-10-CM | POA: Diagnosis not present

## 2018-04-23 DIAGNOSIS — L4050 Arthropathic psoriasis, unspecified: Secondary | ICD-10-CM | POA: Diagnosis not present

## 2018-04-23 DIAGNOSIS — Z79899 Other long term (current) drug therapy: Secondary | ICD-10-CM | POA: Diagnosis not present

## 2018-04-23 DIAGNOSIS — Z6828 Body mass index (BMI) 28.0-28.9, adult: Secondary | ICD-10-CM | POA: Diagnosis not present

## 2018-04-23 DIAGNOSIS — M150 Primary generalized (osteo)arthritis: Secondary | ICD-10-CM | POA: Diagnosis not present

## 2018-07-22 DIAGNOSIS — Z6827 Body mass index (BMI) 27.0-27.9, adult: Secondary | ICD-10-CM | POA: Diagnosis not present

## 2018-07-22 DIAGNOSIS — M2550 Pain in unspecified joint: Secondary | ICD-10-CM | POA: Diagnosis not present

## 2018-07-22 DIAGNOSIS — F4321 Adjustment disorder with depressed mood: Secondary | ICD-10-CM | POA: Diagnosis not present

## 2018-07-22 DIAGNOSIS — L4050 Arthropathic psoriasis, unspecified: Secondary | ICD-10-CM | POA: Diagnosis not present

## 2018-07-22 DIAGNOSIS — M353 Polymyalgia rheumatica: Secondary | ICD-10-CM | POA: Diagnosis not present

## 2018-07-22 DIAGNOSIS — M150 Primary generalized (osteo)arthritis: Secondary | ICD-10-CM | POA: Diagnosis not present

## 2018-07-22 DIAGNOSIS — E663 Overweight: Secondary | ICD-10-CM | POA: Diagnosis not present

## 2018-08-07 DIAGNOSIS — D692 Other nonthrombocytopenic purpura: Secondary | ICD-10-CM | POA: Diagnosis not present

## 2018-08-07 DIAGNOSIS — S20229A Contusion of unspecified back wall of thorax, initial encounter: Secondary | ICD-10-CM | POA: Diagnosis not present

## 2018-08-07 DIAGNOSIS — M545 Low back pain: Secondary | ICD-10-CM | POA: Diagnosis not present

## 2018-08-20 ENCOUNTER — Ambulatory Visit
Admission: RE | Admit: 2018-08-20 | Discharge: 2018-08-20 | Disposition: A | Discharge status: Home / Self Care | Payer: Medicare HMO | Source: Ambulatory Visit | Attending: Family Medicine | Admitting: Family Medicine

## 2018-08-20 ENCOUNTER — Other Ambulatory Visit: Payer: Self-pay | Admitting: Family Medicine

## 2018-08-20 DIAGNOSIS — M545 Low back pain: Secondary | ICD-10-CM

## 2018-08-26 DIAGNOSIS — M545 Low back pain: Secondary | ICD-10-CM | POA: Diagnosis not present

## 2018-08-29 ENCOUNTER — Other Ambulatory Visit: Payer: Self-pay | Admitting: Orthopedic Surgery

## 2018-08-29 DIAGNOSIS — M545 Low back pain, unspecified: Secondary | ICD-10-CM

## 2018-09-09 ENCOUNTER — Ambulatory Visit
Admission: RE | Admit: 2018-09-09 | Discharge: 2018-09-09 | Disposition: A | Discharge status: Home / Self Care | Payer: Medicare HMO | Source: Ambulatory Visit | Attending: Orthopedic Surgery | Admitting: Orthopedic Surgery

## 2018-09-09 DIAGNOSIS — M545 Low back pain, unspecified: Secondary | ICD-10-CM

## 2018-09-09 DIAGNOSIS — M48061 Spinal stenosis, lumbar region without neurogenic claudication: Secondary | ICD-10-CM | POA: Diagnosis not present

## 2018-09-12 DIAGNOSIS — M4856XA Collapsed vertebra, not elsewhere classified, lumbar region, initial encounter for fracture: Secondary | ICD-10-CM | POA: Diagnosis not present

## 2018-10-17 ENCOUNTER — Emergency Department (HOSPITAL_COMMUNITY)
Admission: EM | Admit: 2018-10-17 | Discharge: 2018-10-17 | Disposition: A | Discharge status: Home / Self Care | Payer: Medicare HMO | Attending: Emergency Medicine | Admitting: Emergency Medicine

## 2018-10-17 ENCOUNTER — Encounter (HOSPITAL_COMMUNITY): Payer: Self-pay

## 2018-10-17 ENCOUNTER — Emergency Department (HOSPITAL_COMMUNITY): Payer: Medicare HMO

## 2018-10-17 ENCOUNTER — Other Ambulatory Visit: Payer: Self-pay

## 2018-10-17 DIAGNOSIS — R000 Tachycardia, unspecified: Secondary | ICD-10-CM | POA: Diagnosis not present

## 2018-10-17 DIAGNOSIS — Y9389 Activity, other specified: Secondary | ICD-10-CM | POA: Diagnosis not present

## 2018-10-17 DIAGNOSIS — Z8673 Personal history of transient ischemic attack (TIA), and cerebral infarction without residual deficits: Secondary | ICD-10-CM | POA: Insufficient documentation

## 2018-10-17 DIAGNOSIS — S0990XA Unspecified injury of head, initial encounter: Secondary | ICD-10-CM | POA: Diagnosis not present

## 2018-10-17 DIAGNOSIS — Y92009 Unspecified place in unspecified non-institutional (private) residence as the place of occurrence of the external cause: Secondary | ICD-10-CM | POA: Insufficient documentation

## 2018-10-17 DIAGNOSIS — Z7902 Long term (current) use of antithrombotics/antiplatelets: Secondary | ICD-10-CM | POA: Insufficient documentation

## 2018-10-17 DIAGNOSIS — Y998 Other external cause status: Secondary | ICD-10-CM | POA: Insufficient documentation

## 2018-10-17 DIAGNOSIS — I10 Essential (primary) hypertension: Secondary | ICD-10-CM | POA: Diagnosis not present

## 2018-10-17 DIAGNOSIS — W19XXXA Unspecified fall, initial encounter: Secondary | ICD-10-CM

## 2018-10-17 DIAGNOSIS — W108XXA Fall (on) (from) other stairs and steps, initial encounter: Secondary | ICD-10-CM | POA: Diagnosis not present

## 2018-10-17 DIAGNOSIS — S01312A Laceration without foreign body of left ear, initial encounter: Secondary | ICD-10-CM | POA: Insufficient documentation

## 2018-10-17 DIAGNOSIS — Z7982 Long term (current) use of aspirin: Secondary | ICD-10-CM | POA: Insufficient documentation

## 2018-10-17 DIAGNOSIS — I2510 Atherosclerotic heart disease of native coronary artery without angina pectoris: Secondary | ICD-10-CM | POA: Insufficient documentation

## 2018-10-17 DIAGNOSIS — S199XXA Unspecified injury of neck, initial encounter: Secondary | ICD-10-CM | POA: Diagnosis not present

## 2018-10-17 DIAGNOSIS — Z79899 Other long term (current) drug therapy: Secondary | ICD-10-CM | POA: Insufficient documentation

## 2018-10-17 DIAGNOSIS — R0902 Hypoxemia: Secondary | ICD-10-CM | POA: Diagnosis not present

## 2018-10-17 DIAGNOSIS — R58 Hemorrhage, not elsewhere classified: Secondary | ICD-10-CM | POA: Diagnosis not present

## 2018-10-17 DIAGNOSIS — F10929 Alcohol use, unspecified with intoxication, unspecified: Secondary | ICD-10-CM | POA: Diagnosis not present

## 2018-10-17 DIAGNOSIS — Z951 Presence of aortocoronary bypass graft: Secondary | ICD-10-CM | POA: Diagnosis not present

## 2018-10-17 MED ORDER — LIDOCAINE HCL 2 % IJ SOLN
10.0000 mL | Freq: Once | INTRAMUSCULAR | Status: DC
  Filled 2018-10-17: qty 20

## 2018-10-17 NOTE — Discharge Instructions (Signed)
Please monitor your wound closely for the next few days.  You have absorbable sutures in place. Take tylenol at home as needed for pain.  Return if you notice any sign of infection.

## 2018-10-17 NOTE — ED Provider Notes (Signed)
[place] EMERGENCY DEPARTMENT Provider Note   CSN: [id number] Arrival date & time: 10/17/18  1839     History   Chief Complaint No chief complaint on file.   HPI Russell Hebert is a 72 y.o. male.  The history is provided by the patient. No language interpreter was used.  Fall      72 year old male with history of CAD, hypertension, prior stroke brought here via EMS for evaluation of fall.  Patient states he was walking up the steps outside and fell forward striking his left side of head against the railing and fell to the ground.  He denies hitting his head or loss of consciousness.  Incident happened just prior to arrival.  No one was around when this happened.  He did report pain to his left ear at the site of the impact with bleeding.  He is currently on Plavix.  He report mild headache which is sharp and throbbing.  He denies any lightheadedness, dizziness, confusion, neck pain, chest pain, trouble breathing, abdominal pain, back pain or pain to his extremities.  He denies any precipitating symptoms prior to the fall.  He denies any dysuria.  Past Medical History:  Diagnosis Date  . Carotid artery disease ([place])    Doppler, December, 2010, 0-39% bilateral  . Coronary artery disease    Acute lateral MI October, 2008, bare-metal stent circumflex /  catheterization November, 2009, LIMA-LAD patent,, all vein grafts patent OM stent patent... some sluggish distal flow... medical therapy  . Ejection fraction   . GERD (gastroesophageal reflux disease)   . Hx of CABG    2000  . Hypercholesterolemia    Simvastatin started 2009  . Hypertension   . 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 TIA (transient ischemic attack) 2006    Patient Active Problem List   Diagnosis Date Noted  . PMR (polymyalgia rheumatica) (HCC) 02/08/2016  . Ejection fraction   . Coronary artery disease   . Hypertension   .  Hypercholesterolemia   . Intracerebral bleed ([place])   . TIA (transient ischemic attack)   . GERD (gastroesophageal reflux disease)   . Hx of CABG   . Carotid artery disease [facility])     Past Surgical History:  Procedure Laterality Date  . CORONARY ARTERY BYPASS GRAFT  2000   Acute lateral MI and bare-metal stent circumflex..(10/08) Cath 10/14/08; LIMA to the LAD patent ; all vein grafts patent; OM stent patent..some sluggish distal flow.        Home Medications    Prior to Admission medications   Medication Sig Start Date End Date Taking? Authorizing Provider  aspirin 81 MG tablet Take 81 mg by mouth daily.      [provider]  carvedilol (COREG) 3.125 MG tablet Take 3.125 mg by mouth 2 (two) times daily with a meal.      [provider]  clopidogrel (PLAVIX) 75 MG tablet Take 75 mg by mouth daily.      [provider]  diazepam (VALIUM) 5 MG tablet Take 5 mg by mouth every 6 (six) hours as needed (for vertigo).     [provider]  hydrochlorothiazide (HYDRODIURIL) 12.5 MG tablet Take 12.5 mg by mouth daily.      [provider]  losartan (COZAAR) 50 MG tablet Take 50 mg by mouth daily.      [provider]  omeprazole (PRILOSEC) 20 MG capsule Take  20 mg by mouth daily.      [provider]  predniSONE (DELTASONE) 5 MG tablet Take 5 mg by mouth. 2 tablets in the morning and 1 at night 12/21/15   [provider]  simvastatin (ZOCOR) 40 MG tablet Take 40 mg by mouth at bedtime.      [provider]    Family History Family History  Problem Relation Age of Onset  . Heart disease Mother   . Heart failure Mother   . Heart failure Father     Social History Social History   Tobacco Use  . Smoking status: Never Smoker  Substance Use Topics  . Alcohol use: No  . Drug use: No     Allergies   Morphine   Review of Systems Review of Systems  All other systems reviewed and are  negative.    Physical Exam Updated Vital Signs BP 129/82 (BP Location: Right Arm)   Pulse 65   Temp 97.7 F (36.5 C) (Oral)   Ht 5\' 5"  (1.651 m)   Wt 83.9 kg   SpO2 100%   BMI 30.79 kg/m   Physical Exam  Constitutional: He is oriented to person, place, and time. He appears well-developed and well-nourished. No distress.  Elderly male, hard of hearing, sitting in bed in no acute discomfort.  HENT:  Head: Normocephalic.  Left ear: There is a 2 cm superficial laceration at the antihelix adjacent to the tragus actively bleeding.  It did not appears to be a through and through laceration.  No foreign body noted.  It is tender to palpation.  Difficult to visualize TM due to the amount of blood that has accumulated.  No battle sign, no raccoon's eyes.  No midface tenderness, no malocclusion.  Eyes: Pupils are equal, round, and reactive to light. Conjunctivae and EOM are normal.  Neck: Normal range of motion. Neck supple.  No cervical midline spine tenderness.  Cardiovascular: Normal rate and regular rhythm.  Pulmonary/Chest: Effort normal and breath sounds normal.  Abdominal: Soft. Bowel sounds are normal. He exhibits no distension. There is no tenderness.  Musculoskeletal:  Able to move all four extremities.  No tenderness along midline spinous process  Neurological: He is alert and oriented to person, place, and time.  Skin: No rash noted.  Psychiatric: He has a normal mood and affect.  Nursing note and vitals reviewed.    ED Treatments / Results  Labs (all labs ordered are listed, but only abnormal results are displayed) Labs Reviewed  URINALYSIS, ROUTINE W REFLEX MICROSCOPIC    EKG None  ED ECG REPORT   Date: 10/17/2018  Rate: 66  Rhythm: normal sinus rhythm  QRS Axis: normal  Intervals: PR prolonged  ST/T Wave abnormalities: nonspecific ST changes  Conduction Disutrbances:left bundle branch block  Narrative Interpretation:   Old EKG Reviewed: unchanged  I have  personally reviewed the EKG tracing and agree with the computerized printout as noted.   Radiology Ct Head Wo Contrast  Result Date: 10/17/2018 CLINICAL DATA:  Unwitnessed fall.  Laceration to the left year. EXAM: CT HEAD WITHOUT CONTRAST CT CERVICAL SPINE WITHOUT CONTRAST TECHNIQUE: Multidetector CT imaging of the head and cervical spine was performed following the standard protocol without intravenous contrast. Multiplanar CT image reconstructions of the cervical spine were also generated. COMPARISON:  CT brain 07/11/2005 FINDINGS: CT HEAD FINDINGS Brain: No evidence of acute infarction, hemorrhage, extra-axial collection, ventriculomegaly, or mass effect. Old right frontal lobe infarct. Old right basal ganglia lacunar infarct.  Generalized cerebral atrophy. Periventricular white matter low attenuation likely secondary to microangiopathy. Vascular: Cerebrovascular atherosclerotic calcifications are noted. Skull: Negative for fracture or focal lesion. Sinuses/Orbits: Visualized portions of the orbits are unremarkable. Visualized portions of the paranasal sinuses and mastoid air cells are unremarkable. Other: None. CT CERVICAL SPINE FINDINGS Alignment: 3 mm anterolisthesis of C5 on C6 secondary to severe bilateral facet arthropathy. Alignment is otherwise anatomic. Skull base and vertebrae: No acute fracture. No primary bone lesion or focal pathologic process. Soft tissues and spinal canal: No prevertebral fluid or swelling. No visible canal hematoma. Disc levels: Degenerative disc disease with disc height loss at C5-6 and C6-7. At C2-3 there is moderate left and mild right facet arthropathy. At C3-4 there is severe right and mild left facet arthropathy with right foraminal stenosis. At C4-5 there is severe bilateral facet arthropathy with foraminal stenosis bilaterally, left greater than right. At C5-6 there is severe bilateral facet arthropathy and foraminal stenosis. At C6-7 there is left facet arthropathy.  At C7-T1 there is mild bilateral facet arthropathy. Upper chest: Lung apices are clear. Other: No fluid collection or hematoma. Bilateral carotid artery atherosclerosis. Thoracic aortic atherosclerosis. IMPRESSION: 1. No acute intracranial pathology. 2.  No acute osseous injury of the cervical spine. 3. Cervical spine spondylosis as described above. 4. Bilateral carotid artery atherosclerosis. 5.  Aortic Atherosclerosis (ICD10-I70.0). Electronically Signed   By: Kathreen Devoid   On: 10/17/2018 19:45   Ct Cervical Spine Wo Contrast  Result Date: 10/17/2018 CLINICAL DATA:  Unwitnessed fall.  Laceration to the left year. EXAM: CT HEAD WITHOUT CONTRAST CT CERVICAL SPINE WITHOUT CONTRAST TECHNIQUE: Multidetector CT imaging of the head and cervical spine was performed following the standard protocol without intravenous contrast. Multiplanar CT image reconstructions of the cervical spine were also generated. COMPARISON:  CT brain 07/11/2005 FINDINGS: CT HEAD FINDINGS Brain: No evidence of acute infarction, hemorrhage, extra-axial collection, ventriculomegaly, or mass effect. Old right frontal lobe infarct. Old right basal ganglia lacunar infarct. Generalized cerebral atrophy. Periventricular white matter low attenuation likely secondary to microangiopathy. Vascular: Cerebrovascular atherosclerotic calcifications are noted. Skull: Negative for fracture or focal lesion. Sinuses/Orbits: Visualized portions of the orbits are unremarkable. Visualized portions of the paranasal sinuses and mastoid air cells are unremarkable. Other: None. CT CERVICAL SPINE FINDINGS Alignment: 3 mm anterolisthesis of C5 on C6 secondary to severe bilateral facet arthropathy. Alignment is otherwise anatomic. Skull base and vertebrae: No acute fracture. No primary bone lesion or focal pathologic process. Soft tissues and spinal canal: No prevertebral fluid or swelling. No visible canal hematoma. Disc levels: Degenerative disc disease with disc  height loss at C5-6 and C6-7. At C2-3 there is moderate left and mild right facet arthropathy. At C3-4 there is severe right and mild left facet arthropathy with right foraminal stenosis. At C4-5 there is severe bilateral facet arthropathy with foraminal stenosis bilaterally, left greater than right. At C5-6 there is severe bilateral facet arthropathy and foraminal stenosis. At C6-7 there is left facet arthropathy. At C7-T1 there is mild bilateral facet arthropathy. Upper chest: Lung apices are clear. Other: No fluid collection or hematoma. Bilateral carotid artery atherosclerosis. Thoracic aortic atherosclerosis. IMPRESSION: 1. No acute intracranial pathology. 2.  No acute osseous injury of the cervical spine. 3. Cervical spine spondylosis as described above. 4. Bilateral carotid artery atherosclerosis. 5.  Aortic Atherosclerosis (ICD10-I70.0). Electronically Signed   By: Kathreen Devoid   On: 10/17/2018 19:45    Procedures .Marland KitchenLaceration Repair Date/Time: 10/17/2018 9:02 PM Performed by: Domenic Moras, PA-C  Authorized by: Domenic Moras, PA-C   Consent:    Consent obtained:  Verbal   Consent given by:  Patient   Risks discussed:  Infection, need for additional repair, pain, poor cosmetic result and poor wound healing   Alternatives discussed:  No treatment and delayed treatment Universal protocol:    Procedure explained and questions answered to patient or proxy's satisfaction: yes     Relevant documents present and verified: yes     Test results available and properly labeled: yes     Imaging studies available: yes     Required blood products, implants, devices, and special equipment available: yes     Site/side marked: yes     Immediately prior to procedure, a time out was called: yes     Patient identity confirmed:  Verbally with patient Anesthesia (see MAR for exact dosages):    Anesthesia method:  Local infiltration   Local anesthetic:  Lidocaine 2% w/o epi Laceration details:    Location:   Ear   Ear location:  L ear   Length (cm):  2   Depth (mm):  3 Repair type:    Repair type:  Intermediate Exploration:    Hemostasis achieved with:  Direct pressure   Wound exploration: wound explored through full range of motion     Wound extent: no foreign bodies/material noted, no nerve damage noted and no vascular damage noted     Contaminated: no   Skin repair:    Repair method:  Sutures   Suture size:  5-0   Suture material:  Fast-absorbing gut   Suture technique:  Simple interrupted   Number of sutures:  5 Approximation:    Approximation:  Close Post-procedure details:    Dressing:  Bulky dressing   Patient tolerance of procedure:  Tolerated well, no immediate complications   (including critical care time)  Medications Ordered in ED Medications  lidocaine (XYLOCAINE) 2 % (with pres) injection 200 mg (has no administration in time range)     Initial Impression / Assessment and Plan / ED Course  I have reviewed the triage vital signs and the nursing notes.  Pertinent labs & imaging results that were available during my care of the patient were reviewed by me and considered in my medical decision making (see chart for details).     BP 129/82 (BP Location: Right Arm)   Pulse 65   Temp 97.7 F (36.5 C) (Oral)   Ht 5\' 5"  (1.651 m)   Wt 83.9 kg   SpO2 100%   BMI 30.79 kg/m    Final Clinical Impressions(s) / ED Diagnoses   Final diagnoses:  Fall at home, initial encounter  Laceration of antihelix of left ear, initial encounter    ED Discharge Orders    None     8:59 PM Patient had a mechanical fall and struck his ear against a guardrail.  He suffered a superficial laceration adjacent to the tragus without significant cartilage damage.  CT scan of head and cervical spine unremarkable.  Patient is mentating appropriately.  No stroke symptoms.  Wound were irrigated and sutured by me.  Absorbable sutures were placed.  Wound care instruction given.  Return  precautions discussed.  Care discussed with Dr. Sedonia Small.    Domenic Moras, PA-C 10/17/18 2105    Maudie Flakes, MD 10/17/18 [phone]

## 2018-10-17 NOTE — ED Triage Notes (Signed)
Pt fell at home unwitnessed per EMS and suffered ear lac. Pt was at home drinking (couple beers per pt report) while dtr was there. Suffered laceration to left ear, wrapped up with kerlex and gauze on arrival to ER. VSS.

## 2018-10-23 DIAGNOSIS — M2550 Pain in unspecified joint: Secondary | ICD-10-CM | POA: Diagnosis not present

## 2018-10-23 DIAGNOSIS — Z6827 Body mass index (BMI) 27.0-27.9, adult: Secondary | ICD-10-CM | POA: Diagnosis not present

## 2018-10-23 DIAGNOSIS — M150 Primary generalized (osteo)arthritis: Secondary | ICD-10-CM | POA: Diagnosis not present

## 2018-10-23 DIAGNOSIS — L4050 Arthropathic psoriasis, unspecified: Secondary | ICD-10-CM | POA: Diagnosis not present

## 2018-10-23 DIAGNOSIS — E663 Overweight: Secondary | ICD-10-CM | POA: Diagnosis not present

## 2018-10-23 DIAGNOSIS — M353 Polymyalgia rheumatica: Secondary | ICD-10-CM | POA: Diagnosis not present

## 2018-10-23 DIAGNOSIS — F4321 Adjustment disorder with depressed mood: Secondary | ICD-10-CM | POA: Diagnosis not present

## 2018-11-10 ENCOUNTER — Ambulatory Visit: Payer: Medicare HMO | Admitting: Cardiology

## 2018-11-10 ENCOUNTER — Encounter: Payer: Self-pay | Admitting: Cardiology

## 2018-11-10 VITALS — BP 110/70 | HR 70 | Ht 65.0 in | Wt 173.0 lb

## 2018-11-10 DIAGNOSIS — M353 Polymyalgia rheumatica: Secondary | ICD-10-CM

## 2018-11-10 DIAGNOSIS — I2510 Atherosclerotic heart disease of native coronary artery without angina pectoris: Secondary | ICD-10-CM

## 2018-11-10 DIAGNOSIS — Z789 Other specified health status: Secondary | ICD-10-CM

## 2018-11-10 DIAGNOSIS — E782 Mixed hyperlipidemia: Secondary | ICD-10-CM

## 2018-11-10 NOTE — Patient Instructions (Addendum)
Medication Instructions:  You may take Magnesium 200 mg daily.  This can be bought over the counter at your pharmacy. Discontinue your Aspirin. Continue all other medications as listed.  If you need a refill on your cardiac medications before your next appointment, please call your pharmacy.   Follow-Up: At [facility], you and your health needs are our priority.  As part of our continuing mission to provide you with exceptional heart care, we have created designated Provider Care Teams.  These Care Teams include your primary Cardiologist (physician) and Advanced Practice Providers (APPs -  Physician Assistants and Nurse Practitioners) who all work together to provide you with the care you need, when you need it. You will need a follow up appointment in 12 months.  Please call our office 2 months in advance to schedule this appointment.  You may see Dr Candee Furbish or one of the following Advanced Practice Providers on your designated Care Team:   Truitt Merle, NP Cecilie Kicks, NP . Kathyrn Drown, NP  Thank you for choosing [facility]!!

## 2018-11-10 NOTE — Progress Notes (Signed)
Cardiology Office Note:    Date:  11/10/2018   ID:  Russell Hebert, DOB [birth year], MRN [id number]  PCP:  Aurea Graff.Marlou Sa, MD  Cardiologist:  No primary care provider on file.  Electrophysiologist:  None   Referring MD: Alroy Dust, L.Marlou Sa, MD     History of Present Illness:    Russell Hebert is a 72 y.o. male here for follow-up of coronary artery disease with prior bypass in 03/08/99 with subsequent lateral MI BMS in 2007-03-08 to circumflex, repeat cath in 03-07-08 stent was patent here for follow-up.  She has 3 total stents placed.  She had a right thalamic intracranial hemorrhage in 2003/03/08 with no recurrent issues.  Carotid artery disease was mild bilateral and stable on Doppler in 03-08-2015.  I used to care of wife with end-stage renal disease she died 03/07/18.  He has a daughter who lives in [place].  Prior statin intolerant to Lipitor Crestor.  He does tolerate simvastatin.  Dr. Lenna Gilford diagnosed polymyalgia rheumatica.  Prednisone.  Leg cramps at night, get up walk and ease off.  When he gets his methotrexate, he has diarrhea on that Monday.  Past Medical History:  Diagnosis Date  . Carotid artery disease ([place])    Doppler, December, 2010, 0-39% bilateral  . Coronary artery disease    Acute lateral MI October, 2008, bare-metal stent circumflex /  catheterization November, 2009, LIMA-LAD patent,, all vein grafts patent OM stent patent... some sluggish distal flow... medical therapy  . Ejection fraction   . GERD (gastroesophageal reflux disease)   . Hx of CABG    08-Mar-1999  . Hypercholesterolemia    Simvastatin started 03-07-2008  . Hypertension   . Intracerebral bleed ([place])    right thalamic, 03/08/03, despite this tolerates aspirin and Plavix  . TIA (transient ischemic attack) 2005-03-07    Past Surgical History:  Procedure Laterality Date  . CORONARY ARTERY BYPASS GRAFT  2000   Acute lateral MI and bare-metal stent circumflex..(10/08) Cath 10/14/08; LIMA to the LAD patent ; all vein grafts  patent; OM stent patent..some sluggish distal flow.    Current Medications: Current Meds  Medication Sig  . carvedilol (COREG) 3.125 MG tablet Take 3.125 mg by mouth daily.   . clopidogrel (PLAVIX) 75 MG tablet Take 75 mg by mouth daily.    . diazepam (VALIUM) 5 MG tablet Take 5 mg by mouth every 6 (six) hours as needed (for vertigo).   . folic acid (FOLVITE) 1 MG tablet Take 2 mg by mouth daily.  . hydrochlorothiazide (HYDRODIURIL) 12.5 MG tablet Take 12.5 mg by mouth daily.    [name] losartan (COZAAR) 50 MG tablet Take 50 mg by mouth daily.    . Magnesium 200 MG TABS Take 200 mg by mouth daily.  . methotrexate (RHEUMATREX) 2.5 MG tablet Take 8 tablets by mouth once a week.  [name] omeprazole (PRILOSEC) 20 MG capsule Take 20 mg by mouth daily.    . predniSONE (DELTASONE) 5 MG tablet Take 5 mg by mouth daily.   . simvastatin (ZOCOR) 40 MG tablet Take 40 mg by mouth at bedtime.    . traMADol (ULTRAM) 50 MG tablet Take 50 mg by mouth as needed.  . [DISCONTINUED] aspirin 81 MG tablet Take 81 mg by mouth daily.       Allergies:   Morphine   Social History   Socioeconomic History  . Marital status: Married    Spouse name: Not on file  . Number of children: Not on file  .  Years of education: Not on file  . Highest education level: Not on file  Occupational History  . Occupation: retired    Comment: Information systems manager  Social Needs  . Financial resource strain: Not on file  . Food insecurity:    Worry: Not on file    Inability: Not on file  . Transportation needs:    Medical: Not on file    Non-medical: Not on file  Tobacco Use  . Smoking status: Never Smoker  . Smokeless tobacco: Never Used  Substance and Sexual Activity  . Alcohol use: No  . Drug use: No  . Sexual activity: Not Currently  Lifestyle  . Physical activity:    Days per week: Not on file    Minutes per session: Not on file  . Stress: Not on file  Relationships  . Social connections:    Talks on phone:  Not on file    Gets together: Not on file    Attends religious service: Not on file    Active member of club or organization: Not on file    Attends meetings of clubs or organizations: Not on file    Relationship status: Not on file  Other Topics Concern  . Not on file  Social History Narrative  . Not on file     Family History: The patient's family history includes Heart disease in his mother; Heart failure in his father and mother.  ROS:   Please see the history of present illness.     All other systems reviewed and are negative.  EKGs/Labs/Other Studies Reviewed:    The following studies were reviewed today: Prior office notes EKG lab work  EKG:  EKG is not ordered today.  10/17/2018-sinus rhythm left bundle branch blocklike appearance no other changes made.  Personally reviewed and interpreted  Recent Labs: No results found for requested labs within last 8760 hours.  Recent Lipid Panel    Component Value Date/Time   CHOL 204 (HH) 01/18/2009 0940   TRIG 109 01/18/2009 0940   HDL 59.1 01/18/2009 0940   CHOLHDL 3.5 CALC 01/18/2009 0940   VLDL 22 01/18/2009 0940   LDLCALC  10/03/2007 0300    92        Total Cholesterol/HDL:CHD Risk Coronary Heart Disease Risk Table                     Men   Women  1/2 Average Risk   3.4   3.3   LDLDIRECT 121.3 01/18/2009 0940    Physical Exam:    VS:  BP 110/70   Pulse 70   Ht 5\' 5"  (1.651 m)   Wt 173 lb (78.5 kg)   SpO2 94%   BMI 28.79 kg/m     Wt Readings from Last 3 Encounters:  11/10/18 173 lb (78.5 kg)  10/17/18 185 lb (83.9 kg)  02/08/16 200 lb 3.2 oz (90.8 kg)     GEN:  Well nourished, well developed in no acute distress HEENT: Normal NECK: No JVD; No carotid bruits LYMPHATICS: No lymphadenopathy CARDIAC: RRR, no murmurs, rubs, gallops RESPIRATORY:  Clear to auscultation without rales, wheezing or rhonchi  ABDOMEN: Soft, non-tender, non-distended MUSCULOSKELETAL:  No edema; No deformity  SKIN: Warm and  dry NEUROLOGIC:  Alert and oriented x 3 PSYCHIATRIC:  Normal affect   ASSESSMENT:    1. Coronary artery disease involving native coronary artery of native heart without angina pectoris   2. Polymyalgia rheumatica ([place])  3. Statin intolerance   4. Mixed hyperlipidemia    PLAN:    In order of problems listed above:  Coronary artery disease status post CABG with 3 PCI - Overall doing well without any anginal symptoms.  To minimize his bleeding risk, I will discontinue his aspirin and continue with Plavix 75 mg only.  Hyperlipidemia -Continue with aggressive secondary prevention.  He is tolerating Zocor 40 mg well.  He did not tolerate other statins.  LDL 106.  Optimally would be great for him to be less than 70.  Polymyalgia rheumatica - Methotrexate.  When he does get his injection on Mondays, usually has loose stools with this.  I wonder if this is contributing to his cramping.  I will give him magnesium 200 mg once a day as a small supplement.  Asked him to watch for any worsening diarrhea with this medication.  On prednisone as well.  History of intracranial hemorrhage thalamic - Now Plavix only.  Carotid artery disease mild -Continue to monitor clinically.  Secondary prevention.   Medication Adjustments/Labs and Tests Ordered: Current medicines are reviewed at length with the patient today.  Concerns regarding medicines are outlined above.  No orders of the defined types were placed in this encounter.  No orders of the defined types were placed in this encounter.   Patient Instructions  Medication Instructions:  You may take Magnesium 200 mg daily.  This can be bought over the counter at your pharmacy. Discontinue your Aspirin. Continue all other medications as listed.  If you need a refill on your cardiac medications before your next appointment, please call your pharmacy.   Follow-Up: At [facility], you and your health needs are our priority.  As part of our  continuing mission to provide you with exceptional heart care, we have created designated Provider Care Teams.  These Care Teams include your primary Cardiologist (physician) and Advanced Practice Providers (APPs -  Physician Assistants and Nurse Practitioners) who all work together to provide you with the care you need, when you need it. You will need a follow up appointment in 12 months.  Please call our office 2 months in advance to schedule this appointment.  You may see Dr Candee Furbish or one of the following Advanced Practice Providers on your designated Care Team:   Truitt Merle, NP Cecilie Kicks, NP . Kathyrn Drown, NP  Thank you for choosing [facility]!!        Signed, Candee Furbish, MD  11/10/2018 10:02 AM    [place]

## 2019-01-26 DIAGNOSIS — K521 Toxic gastroenteritis and colitis: Secondary | ICD-10-CM | POA: Diagnosis not present

## 2019-01-26 DIAGNOSIS — M2550 Pain in unspecified joint: Secondary | ICD-10-CM | POA: Diagnosis not present

## 2019-01-26 DIAGNOSIS — M150 Primary generalized (osteo)arthritis: Secondary | ICD-10-CM | POA: Diagnosis not present

## 2019-01-26 DIAGNOSIS — E663 Overweight: Secondary | ICD-10-CM | POA: Diagnosis not present

## 2019-01-26 DIAGNOSIS — Z6827 Body mass index (BMI) 27.0-27.9, adult: Secondary | ICD-10-CM | POA: Diagnosis not present

## 2019-01-26 DIAGNOSIS — Z9119 Patient's noncompliance with other medical treatment and regimen: Secondary | ICD-10-CM | POA: Diagnosis not present

## 2019-01-26 DIAGNOSIS — F4321 Adjustment disorder with depressed mood: Secondary | ICD-10-CM | POA: Diagnosis not present

## 2019-01-26 DIAGNOSIS — L4050 Arthropathic psoriasis, unspecified: Secondary | ICD-10-CM | POA: Diagnosis not present

## 2019-01-26 DIAGNOSIS — M353 Polymyalgia rheumatica: Secondary | ICD-10-CM | POA: Diagnosis not present

## 2019-02-18 DIAGNOSIS — L4050 Arthropathic psoriasis, unspecified: Secondary | ICD-10-CM | POA: Diagnosis not present

## 2019-03-04 DIAGNOSIS — L4050 Arthropathic psoriasis, unspecified: Secondary | ICD-10-CM | POA: Diagnosis not present

## 2019-03-25 DIAGNOSIS — Z0000 Encounter for general adult medical examination without abnormal findings: Secondary | ICD-10-CM | POA: Diagnosis not present

## 2019-03-25 DIAGNOSIS — K219 Gastro-esophageal reflux disease without esophagitis: Secondary | ICD-10-CM | POA: Diagnosis not present

## 2019-03-25 DIAGNOSIS — I679 Cerebrovascular disease, unspecified: Secondary | ICD-10-CM | POA: Diagnosis not present

## 2019-03-25 DIAGNOSIS — Z1211 Encounter for screening for malignant neoplasm of colon: Secondary | ICD-10-CM | POA: Diagnosis not present

## 2019-03-25 DIAGNOSIS — E7800 Pure hypercholesterolemia, unspecified: Secondary | ICD-10-CM | POA: Diagnosis not present

## 2019-03-25 DIAGNOSIS — R7309 Other abnormal glucose: Secondary | ICD-10-CM | POA: Diagnosis not present

## 2019-03-25 DIAGNOSIS — I10 Essential (primary) hypertension: Secondary | ICD-10-CM | POA: Diagnosis not present

## 2019-04-06 DIAGNOSIS — L4050 Arthropathic psoriasis, unspecified: Secondary | ICD-10-CM | POA: Diagnosis not present

## 2019-04-28 DIAGNOSIS — M2550 Pain in unspecified joint: Secondary | ICD-10-CM | POA: Diagnosis not present

## 2019-04-28 DIAGNOSIS — M150 Primary generalized (osteo)arthritis: Secondary | ICD-10-CM | POA: Diagnosis not present

## 2019-04-28 DIAGNOSIS — Z79899 Other long term (current) drug therapy: Secondary | ICD-10-CM | POA: Diagnosis not present

## 2019-04-28 DIAGNOSIS — M353 Polymyalgia rheumatica: Secondary | ICD-10-CM | POA: Diagnosis not present

## 2019-04-28 DIAGNOSIS — L4050 Arthropathic psoriasis, unspecified: Secondary | ICD-10-CM | POA: Diagnosis not present

## 2019-05-30 ENCOUNTER — Emergency Department (HOSPITAL_COMMUNITY)
Admission: EM | Admit: 2019-05-30 | Discharge: 2019-05-30 | Disposition: A | Discharge status: Home / Self Care | Payer: Medicare HMO | Attending: Emergency Medicine | Admitting: Emergency Medicine

## 2019-05-30 ENCOUNTER — Encounter (HOSPITAL_COMMUNITY): Payer: Self-pay | Admitting: Emergency Medicine

## 2019-05-30 ENCOUNTER — Other Ambulatory Visit: Payer: Self-pay

## 2019-05-30 ENCOUNTER — Emergency Department (HOSPITAL_COMMUNITY): Payer: Medicare HMO

## 2019-05-30 DIAGNOSIS — S51822A Laceration with foreign body of left forearm, initial encounter: Secondary | ICD-10-CM | POA: Diagnosis not present

## 2019-05-30 DIAGNOSIS — Z7902 Long term (current) use of antithrombotics/antiplatelets: Secondary | ICD-10-CM | POA: Diagnosis not present

## 2019-05-30 DIAGNOSIS — Z79899 Other long term (current) drug therapy: Secondary | ICD-10-CM | POA: Insufficient documentation

## 2019-05-30 DIAGNOSIS — Y9389 Activity, other specified: Secondary | ICD-10-CM | POA: Diagnosis not present

## 2019-05-30 DIAGNOSIS — W298XXA Contact with other powered powered hand tools and household machinery, initial encounter: Secondary | ICD-10-CM | POA: Insufficient documentation

## 2019-05-30 DIAGNOSIS — Z951 Presence of aortocoronary bypass graft: Secondary | ICD-10-CM | POA: Diagnosis not present

## 2019-05-30 DIAGNOSIS — I10 Essential (primary) hypertension: Secondary | ICD-10-CM | POA: Diagnosis not present

## 2019-05-30 DIAGNOSIS — Y929 Unspecified place or not applicable: Secondary | ICD-10-CM | POA: Insufficient documentation

## 2019-05-30 DIAGNOSIS — I2510 Atherosclerotic heart disease of native coronary artery without angina pectoris: Secondary | ICD-10-CM | POA: Diagnosis not present

## 2019-05-30 DIAGNOSIS — R52 Pain, unspecified: Secondary | ICD-10-CM | POA: Diagnosis not present

## 2019-05-30 DIAGNOSIS — Z8673 Personal history of transient ischemic attack (TIA), and cerebral infarction without residual deficits: Secondary | ICD-10-CM | POA: Insufficient documentation

## 2019-05-30 DIAGNOSIS — R42 Dizziness and giddiness: Secondary | ICD-10-CM | POA: Diagnosis not present

## 2019-05-30 DIAGNOSIS — S51812A Laceration without foreign body of left forearm, initial encounter: Secondary | ICD-10-CM | POA: Diagnosis not present

## 2019-05-30 DIAGNOSIS — S41112A Laceration without foreign body of left upper arm, initial encounter: Secondary | ICD-10-CM | POA: Diagnosis not present

## 2019-05-30 DIAGNOSIS — R58 Hemorrhage, not elsewhere classified: Secondary | ICD-10-CM | POA: Diagnosis not present

## 2019-05-30 DIAGNOSIS — Y999 Unspecified external cause status: Secondary | ICD-10-CM | POA: Insufficient documentation

## 2019-05-30 MED ORDER — CEPHALEXIN 250 MG PO CAPS
500.0000 mg | ORAL_CAPSULE | Freq: Once | ORAL | Status: AC
  Administered 2019-05-30: 500 mg via ORAL
  Filled 2019-05-30: qty 2

## 2019-05-30 MED ORDER — TRAMADOL HCL 50 MG PO TABS: 50.0000 mg | ORAL_TABLET | Freq: Four times a day (QID) | ORAL | 0 refills | Status: DC | PRN

## 2019-05-30 MED ORDER — LIDOCAINE-EPINEPHRINE (PF) 2 %-1:200000 IJ SOLN
10.0000 mL | Freq: Once | INTRAMUSCULAR | Status: AC
  Administered 2019-05-30: 10 mL
  Filled 2019-05-30: qty 20

## 2019-05-30 MED ORDER — HYDROCODONE-ACETAMINOPHEN 5-325 MG PO TABS
1.0000 | ORAL_TABLET | Freq: Once | ORAL | Status: AC
  Administered 2019-05-30: 1 via ORAL
  Filled 2019-05-30: qty 1

## 2019-05-30 MED ORDER — CEPHALEXIN 500 MG PO CAPS: 500.0000 mg | ORAL_CAPSULE | Freq: Two times a day (BID) | ORAL | 0 refills | Status: AC

## 2019-05-30 NOTE — Discharge Instructions (Addendum)
Please read instructions below.  Keep your wound clean and covered. In 24 hours, you can get your wound wet; gently clean it with soap and water, pat it dry, and reapply a clean bandage. Apply ice for 20 minutes at a time. Elevate your arm as much as possible! Take the antibiotic as prescribed until gone. You can take tramadol every 6 hours as needed for severe pain. You can continue taking your extra strength tylenol as well. Follow up with your primary care or urgent care for wound recheck in 1 week  Return to the ER for fever, pus draining from wound, redness, or new or worsening symptoms.

## 2019-05-30 NOTE — ED Notes (Signed)
Cleaned pt's left arm wound w/sterile water, applied telfa pad and kerlex.

## 2019-05-30 NOTE — ED Provider Notes (Signed)
[facility] EMERGENCY DEPARTMENT Provider Note   CSN: [id number] Arrival date & time: 05/30/19  1115    History   Chief Complaint Chief Complaint  Patient presents with  . Extremity Laceration    HPI Russell Hebert is a 73 y.o. male with past medical history of CAD, hypertension, presenting to the emergency department with sudden onset of laceration to left forearm that occurred just prior to arrival.  Patient states he was using a grinder and it kicked back and hit him in the arm causing a laceration.  He has pain to this area that is worse with movement and palpation.  He feels like the dorsum of his hand feels numb.  He states his last tetanus immunization was less than 5 years ago.  He does not have history of diabetes.  Per chart review, it appears he is on Plavix, however patient states he does not know the names of the medications he takes.  He has no other history of immunocompromise.  No other injuries reported.     The history is provided by the patient.    Past Medical History:  Diagnosis Date  . Carotid artery disease ([place])    Doppler, December, 2010, 0-39% bilateral  . Coronary artery disease    Acute lateral MI October, 2008, bare-metal stent circumflex /  catheterization November, 2009, LIMA-LAD patent,, all vein grafts patent OM stent patent... some sluggish distal flow... medical therapy  . Ejection fraction   . GERD (gastroesophageal reflux disease)   . Hx of CABG    2000  . Hypercholesterolemia    Simvastatin started 2009  . Hypertension   . 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 TIA (transient ischemic attack) 2006    Patient Active Problem List   Diagnosis Date Noted  . PMR (polymyalgia rheumatica) (HCC) 02/08/2016  . Ejection fraction   . Coronary artery disease   . Hypertension   . Hypercholesterolemia   . Intracerebral bleed ([place])   . TIA (transient ischemic attack)    . GERD (gastroesophageal reflux disease)   . Hx of CABG   . Carotid artery disease [facility])     Past Surgical History:  Procedure Laterality Date  . CORONARY ARTERY BYPASS GRAFT  2000   Acute lateral MI and bare-metal stent circumflex..(10/08) Cath 10/14/08; LIMA to the LAD patent ; all vein grafts patent; OM stent patent..some sluggish distal flow.        Home Medications    Prior to Admission medications   Medication Sig Start Date End Date Taking? Authorizing Provider  carvedilol (COREG) 3.125 MG tablet Take 3.125 mg by mouth daily.     [provider]  cephALEXin (KEFLEX) 500 MG capsule Take 1 capsule (500 mg total) by mouth 2 (two) times daily for 7 days. 05/30/19 06/06/19  Robinson, [place] N, PA-C  clopidogrel (PLAVIX) 75 MG tablet Take 75 mg by mouth daily.      [provider]  diazepam (VALIUM) 5 MG tablet Take 5 mg by mouth every 6 (six) hours as needed (for vertigo).     [provider]  folic acid (FOLVITE) 1 MG tablet Take 2 mg by mouth daily. 10/09/18   [provider]  hydrochlorothiazide (HYDRODIURIL) 12.5 MG tablet Take 12.5 mg by mouth daily.      [provider]  losartan (COZAAR) 50 MG tablet Take 50 mg by mouth daily.  [provider]  Magnesium 200 MG TABS Take 200 mg by mouth daily.    [provider]  methotrexate (RHEUMATREX) 2.5 MG tablet Take 8 tablets by mouth once a week. 09/10/18   [provider]  omeprazole (PRILOSEC) 20 MG capsule Take 20 mg by mouth daily.      [provider]  predniSONE (DELTASONE) 5 MG tablet Take 5 mg by mouth daily.  12/21/15   [provider]  simvastatin (ZOCOR) 40 MG tablet Take 40 mg by mouth at bedtime.      [provider]  traMADol (ULTRAM) 50 MG tablet Take 1 tablet (50 mg total) by mouth every 6 (six) hours as needed. 05/30/19   Robinson, [place] N, PA-C    Family History Family History  Problem Relation Age of Onset  . Heart  disease Mother   . Heart failure Mother   . Heart failure Father     Social History Social History   Tobacco Use  . Smoking status: Never Smoker  . Smokeless tobacco: Never Used  Substance Use Topics  . Alcohol use: No  . Drug use: No     Allergies   Morphine   Review of Systems Review of Systems  Musculoskeletal: Positive for myalgias.  Skin: Positive for wound.  Allergic/Immunologic: Negative for immunocompromised state.  Neurological: Positive for numbness.  All other systems reviewed and are negative.    Physical Exam Updated Vital Signs BP 121/79   Pulse (!) 53   Temp 97.7 F (36.5 C) (Oral)   Resp 20   SpO2 94%   Physical Exam Vitals signs and nursing note reviewed.  Constitutional:      Appearance: He is well-developed.  HENT:     Head: Normocephalic and atraumatic.  Eyes:     Conjunctiva/sclera: Conjunctivae normal.  Cardiovascular:     Rate and Rhythm: Normal rate.  Pulmonary:     Effort: Pulmonary effort is normal.  Abdominal:     Palpations: Abdomen is soft.  Musculoskeletal:     Comments: Dorsolateral aspect of the left mid forearm with large gaping ~10cm laceration that appears to go through the fascia and into the muscle. Hemostasis achieved with direct pressure. There appears to be some dirt in the edges of the wound. Pt has good strength with dorsiflexion of the wrist against resistance. Left hand and digits are warm and well-perfused  Skin:    General: Skin is warm.  Neurological:     Mental Status: He is alert.     Comments: Normal sensation to all digits of the left hand  Psychiatric:        Behavior: Behavior normal.        ED Treatments / Results  Labs (all labs ordered are listed, but only abnormal results are displayed) Labs Reviewed - No data to display  EKG None  Radiology Dg Forearm Left  Result Date: 05/30/2019 CLINICAL DATA:  73 year old male with deep laceration to the posterolateral aspect of the left  forearm. Evaluate for foreign bodies and fracture. EXAM: LEFT FOREARM - 2 VIEW COMPARISON:  None. FINDINGS: No evidence of acute fracture or malalignment. No radiopaque foreign bodies. Irregularity of the soft tissues overlying the posterolateral aspect of the mid forearm consistent with soft tissue laceration. Faint atherosclerotic calcifications visualized in the arteries of the forearm. IMPRESSION: Mid forearm soft tissue injury without evidence of acute fracture or embedded radiopaque foreign body. Electronically Signed   By: Jacqulynn Cadet M.D.   On: 05/30/2019  12:13    Procedures .Marland KitchenLaceration Repair  Date/Time: 05/30/2019 1:00 PM Performed by: Robinson, [place] N, PA-C Authorized by: Robinson, [place] N, PA-C   Consent:    Consent obtained:  Verbal   Consent given by:  Patient   Risks discussed:  Pain and infection   Alternatives discussed:  No treatment Anesthesia (see MAR for exact dosages):    Anesthesia method:  Local infiltration   Local anesthetic:  Lidocaine 2% WITH epi Laceration details:    Location:  Shoulder/arm   Shoulder/arm location:  L lower arm   Length (cm):  10   Depth (mm):  10 Repair type:    Repair type:  Intermediate Pre-procedure details:    Preparation:  Patient was prepped and draped in usual sterile fashion and imaging obtained to evaluate for foreign bodies Exploration:    Hemostasis achieved with:  Direct pressure   Wound exploration: wound explored through full range of motion and entire depth of wound probed and visualized     Wound extent: foreign bodies/material     Wound extent: no underlying fracture noted     Foreign bodies/material:  Flecks of dirt Treatment:    Area cleansed with:  Saline   Amount of cleaning:  Extensive   Irrigation solution:  Sterile saline   Irrigation method:  Pressure wash   Visualized foreign bodies/material removed: yes   Fascia repair:    Suture size:  5-0   Suture material:  Vicryl   Suture technique:   Simple interrupted   Number of sutures:  5 Skin repair:    Repair method:  Sutures   Suture size:  4-0   Suture material:  Nylon (vicryl plus)   Suture technique:  Simple interrupted   Number of sutures:  9 Approximation:    Approximation:  Close Post-procedure details:    Dressing:  Non-adherent dressing   Patient tolerance of procedure:  Tolerated well, no immediate complications   (including critical care time)  Medications Ordered in ED Medications  lidocaine-EPINEPHrine (XYLOCAINE W/EPI) 2 %-1:200000 (PF) injection 10 mL (10 mLs Infiltration Given 05/30/19 1140)  HYDROcodone-acetaminophen (NORCO/VICODIN) 5-325 MG per tablet 1 tablet (1 tablet Oral Given 05/30/19 1140)  cephALEXin (KEFLEX) capsule 500 mg (500 mg Oral Given 05/30/19 1313)     Initial Impression / Assessment and Plan / ED Course  I have reviewed the triage vital signs and the nursing notes.  Pertinent labs & imaging results that were available during my care of the patient were reviewed by me and considered in my medical decision making (see chart for details).        Pt with deep laceration to the left forearm that occurred while using a grinder today that slipped and cut his arm.  Wound is deep and does go through the fascia, however this does appears to be a superficial laceration to the muscle as patient has preserved active and resistive ROM of the wrist. Wound explored and base of wound visualized in a bloodless field with flecks of dirt that were removed prior to wound closure. Laceration occurred < 8 hours prior to repair which was well tolerated.  Repaired the fascia with absorbable sutures, and then the skin with Ethilon.  Tdap up-to-date.  Pt has  no comorbidities to effect normal wound healing. Pt discharged with antibiotics given depth of wound and some slight contamination present.  Discussed suture home care with patient and answered questions. Pt to follow-up for wound check and suture removal in 7  days; they  are to return to the ED sooner for signs of infection. Pt is hemodynamically stable with no complaints prior to dc.   Patient was discussed with and evaluated by Dr. Ashok Cordia.  Discussed results, findings, treatment and follow up. Patient advised of return precautions. Patient verbalized understanding and agreed with plan.  [place] Controlled Substance reporting System queried  Final Clinical Impressions(s) / ED Diagnoses   Final diagnoses:  Laceration of arm, left, initial encounter    ED Discharge Orders         Ordered    traMADol (ULTRAM) 50 MG tablet  Every 6 hours PRN,   Status:  Discontinued     05/30/19 1324    traMADol (ULTRAM) 50 MG tablet  Every 6 hours PRN     05/30/19 1325    cephALEXin (KEFLEX) 500 MG capsule  2 times daily     05/30/19 1325           Robinson, [place] N, [place] 05/30/19 1338    Lajean Saver, MD 05/30/19 1722

## 2019-05-30 NOTE — ED Triage Notes (Addendum)
Pt was grinding metal and the circular blade "kicked back" and cut into his forearm-3 inch laceration, bleeding controlled.  Pt reports feeling his "vertigo" starting up.  A&Ox4.

## 2019-06-12 DIAGNOSIS — Z79899 Other long term (current) drug therapy: Secondary | ICD-10-CM | POA: Diagnosis not present

## 2019-06-12 DIAGNOSIS — Z4802 Encounter for removal of sutures: Secondary | ICD-10-CM | POA: Diagnosis not present

## 2019-06-12 DIAGNOSIS — M353 Polymyalgia rheumatica: Secondary | ICD-10-CM | POA: Diagnosis not present

## 2019-06-12 DIAGNOSIS — L4050 Arthropathic psoriasis, unspecified: Secondary | ICD-10-CM | POA: Diagnosis not present

## 2019-06-12 DIAGNOSIS — S51812A Laceration without foreign body of left forearm, initial encounter: Secondary | ICD-10-CM | POA: Diagnosis not present

## 2019-07-29 DIAGNOSIS — Z6828 Body mass index (BMI) 28.0-28.9, adult: Secondary | ICD-10-CM | POA: Diagnosis not present

## 2019-07-29 DIAGNOSIS — E663 Overweight: Secondary | ICD-10-CM | POA: Diagnosis not present

## 2019-07-29 DIAGNOSIS — M150 Primary generalized (osteo)arthritis: Secondary | ICD-10-CM | POA: Diagnosis not present

## 2019-07-29 DIAGNOSIS — M2550 Pain in unspecified joint: Secondary | ICD-10-CM | POA: Diagnosis not present

## 2019-07-29 DIAGNOSIS — M353 Polymyalgia rheumatica: Secondary | ICD-10-CM | POA: Diagnosis not present

## 2019-07-29 DIAGNOSIS — L4050 Arthropathic psoriasis, unspecified: Secondary | ICD-10-CM | POA: Diagnosis not present

## 2019-07-29 DIAGNOSIS — Z79899 Other long term (current) drug therapy: Secondary | ICD-10-CM | POA: Diagnosis not present

## 2019-08-06 DIAGNOSIS — Z1211 Encounter for screening for malignant neoplasm of colon: Secondary | ICD-10-CM | POA: Diagnosis not present

## 2019-08-06 DIAGNOSIS — R7309 Other abnormal glucose: Secondary | ICD-10-CM | POA: Diagnosis not present

## 2019-08-06 DIAGNOSIS — E7800 Pure hypercholesterolemia, unspecified: Secondary | ICD-10-CM | POA: Diagnosis not present

## 2019-08-13 DIAGNOSIS — L4050 Arthropathic psoriasis, unspecified: Secondary | ICD-10-CM | POA: Diagnosis not present

## 2019-08-13 DIAGNOSIS — R110 Nausea: Secondary | ICD-10-CM | POA: Diagnosis not present

## 2019-08-13 DIAGNOSIS — R55 Syncope and collapse: Secondary | ICD-10-CM | POA: Diagnosis not present

## 2019-10-13 DIAGNOSIS — R410 Disorientation, unspecified: Secondary | ICD-10-CM | POA: Diagnosis not present

## 2019-10-13 DIAGNOSIS — L4050 Arthropathic psoriasis, unspecified: Secondary | ICD-10-CM | POA: Diagnosis not present

## 2019-10-13 DIAGNOSIS — Z6828 Body mass index (BMI) 28.0-28.9, adult: Secondary | ICD-10-CM | POA: Diagnosis not present

## 2019-10-13 DIAGNOSIS — M353 Polymyalgia rheumatica: Secondary | ICD-10-CM | POA: Diagnosis not present

## 2019-10-13 DIAGNOSIS — Z23 Encounter for immunization: Secondary | ICD-10-CM | POA: Diagnosis not present

## 2019-10-13 DIAGNOSIS — Z79899 Other long term (current) drug therapy: Secondary | ICD-10-CM | POA: Diagnosis not present

## 2019-10-13 DIAGNOSIS — E663 Overweight: Secondary | ICD-10-CM | POA: Diagnosis not present

## 2019-10-13 DIAGNOSIS — M150 Primary generalized (osteo)arthritis: Secondary | ICD-10-CM | POA: Diagnosis not present

## 2019-10-13 DIAGNOSIS — M2550 Pain in unspecified joint: Secondary | ICD-10-CM | POA: Diagnosis not present

## 2019-11-26 ENCOUNTER — Ambulatory Visit: Payer: Medicare HMO | Admitting: Cardiology

## 2019-11-26 ENCOUNTER — Other Ambulatory Visit: Payer: Self-pay

## 2019-11-26 ENCOUNTER — Encounter: Payer: Self-pay | Admitting: Cardiology

## 2019-11-26 VITALS — BP 110/76 | HR 69 | Ht 65.0 in | Wt 185.2 lb

## 2019-11-26 DIAGNOSIS — M353 Polymyalgia rheumatica: Secondary | ICD-10-CM | POA: Diagnosis not present

## 2019-11-26 DIAGNOSIS — I2510 Atherosclerotic heart disease of native coronary artery without angina pectoris: Secondary | ICD-10-CM | POA: Diagnosis not present

## 2019-11-26 DIAGNOSIS — Z789 Other specified health status: Secondary | ICD-10-CM | POA: Diagnosis not present

## 2019-11-26 DIAGNOSIS — E782 Mixed hyperlipidemia: Secondary | ICD-10-CM

## 2019-11-26 NOTE — Patient Instructions (Signed)
Medication Instructions:  The current medical regimen is effective;  continue present plan and medications.  *If you need a refill on your cardiac medications before your next appointment, please call your pharmacy*  Follow-Up: At [facility], you and your health needs are our priority.  As part of our continuing mission to provide you with exceptional heart care, we have created designated Provider Care Teams.  These Care Teams include your primary Cardiologist (physician) and Advanced Practice Providers (APPs -  Physician Assistants and Nurse Practitioners) who all work together to provide you with the care you need, when you need it.  Your next appointment:   12 month(s)  The format for your next appointment:   In Person  Provider:   Candee Furbish, MD

## 2019-11-26 NOTE — Progress Notes (Signed)
Cardiology Office Note:    Date:  11/26/2019   ID:  Russell Hebert, DOB February 24, 1946, MRN [id number]  PCP:  Aurea Graff.Marlou Sa, MD  Cardiologist:  Candee Furbish, MD  Electrophysiologist:  None   Referring MD: Aurea Graff.Marlou Sa, MD     History of Present Illness:    Russell Hebert is a 73 y.o. male here for follow-up of coronary artery disease with prior bypass, CABG in 03-Mar-1999 with subsequent lateral MI BMS in March 03, 2007 to circumflex, repeat cath in 03-02-08 stent was patent here for follow-up.  He has 3 total stents placed.  She had a right thalamic intracranial hemorrhage in 2003/03/03 with no recurrent issues.  Carotid artery disease was mild bilateral and stable on Doppler in 03/03/15.  I used to care of wife with end-stage renal disease she died 02-Mar-2018.  He has a daughter who lives in [place].  Prior statin intolerant to Lipitor Crestor.  He does tolerate simvastatin.  Dr. Lenna Gilford diagnosed polymyalgia rheumatica.  Prednisone.  Leg cramps at night, get up walk and ease off.  When he gets his methotrexate, he has diarrhea on that 2023-03-03.  11/26/2019-here for follow-up of coronary artery disease.  Overall has been doing quite well without any anginal symptoms.  Being treated for polymyalgia rheumatica.  Denies any fevers chills nausea vomiting syncope bleeding.  Feels well.  Does have occasional easy bleeding bruising when he runs into things he states.  Plavix.  Past Medical History:  Diagnosis Date  . Carotid artery disease ([place])    Doppler, December, 2010, 0-39% bilateral  . Coronary artery disease    Acute lateral MI October, 2008, bare-metal stent circumflex /  catheterization November, 2009, LIMA-LAD patent,, all vein grafts patent OM stent patent... some sluggish distal flow... medical therapy  . Ejection fraction   . GERD (gastroesophageal reflux disease)   . Hx of CABG    03-03-1999  . Hypercholesterolemia    Simvastatin started Mar 02, 2008  . Hypertension   . Intracerebral bleed ([place])    right thalamic, 2003-03-03, despite this tolerates aspirin and Plavix  . TIA (transient ischemic attack) 03/02/2005    Past Surgical History:  Procedure Laterality Date  . CORONARY ARTERY BYPASS GRAFT  2000   Acute lateral MI and bare-metal stent circumflex..(10/08) Cath 10/14/08; LIMA to the LAD patent ; all vein grafts patent; OM stent patent..some sluggish distal flow.    Current Medications: Current Meds  Medication Sig  . carvedilol (COREG) 3.125 MG tablet Take 3.125 mg by mouth daily.   . clopidogrel (PLAVIX) 75 MG tablet Take 75 mg by mouth daily.    . diazepam (VALIUM) 5 MG tablet Take 5 mg by mouth every 6 (six) hours as needed (for vertigo).   . folic acid (FOLVITE) 1 MG tablet Take 2 mg by mouth daily.  . hydrochlorothiazide (HYDRODIURIL) 12.5 MG tablet Take 12.5 mg by mouth daily.    [name] losartan (COZAAR) 50 MG tablet Take 50 mg by mouth daily.    . Magnesium 200 MG TABS Take 200 mg by mouth daily.  . methotrexate (RHEUMATREX) 2.5 MG tablet Take 8 tablets by mouth once a week.  [name] omeprazole (PRILOSEC) 20 MG capsule Take 20 mg by mouth daily.    . predniSONE (DELTASONE) 5 MG tablet Take 5 mg by mouth daily.   . simvastatin (ZOCOR) 40 MG tablet Take 40 mg by mouth at bedtime.    . traMADol (ULTRAM) 50 MG tablet Take 1 tablet (50 mg total) by mouth every  6 (six) hours as needed.     Allergies:   Morphine   Social History   Socioeconomic History  . Marital status: Married    Spouse name: Not on file  . Number of children: Not on file  . Years of education: Not on file  . Highest education level: Not on file  Occupational History  . Occupation: retired    Comment: heating and air conditioning  Tobacco Use  . Smoking status: Never Smoker  . Smokeless tobacco: Never Used  Substance and Sexual Activity  . Alcohol use: No  . Drug use: No  . Sexual activity: Not Currently  Other Topics Concern  . Not on file  Social History Narrative  . Not on file   Social Determinants of  Health   Financial Resource Strain:   . Difficulty of Paying Living Expenses: Not on file  Food Insecurity:   . Worried About Charity fundraiser in the Last Year: Not on file  . Ran Out of Food in the Last Year: Not on file  Transportation Needs:   . Lack of Transportation (Medical): Not on file  . Lack of Transportation (Non-Medical): Not on file  Physical Activity:   . Days of Exercise per Week: Not on file  . Minutes of Exercise per Session: Not on file  Stress:   . Feeling of Stress : Not on file  Social Connections:   . Frequency of Communication with Friends and Family: Not on file  . Frequency of Social Gatherings with Friends and Family: Not on file  . Attends Religious Services: Not on file  . Active Member of Clubs or Organizations: Not on file  . Attends Archivist Meetings: Not on file  . Marital Status: Not on file     Family History: The patient's family history includes Heart disease in his mother; Heart failure in his father and mother.  ROS:   Please see the history of present illness.     All other systems reviewed and are negative.  EKGs/Labs/Other Studies Reviewed:    The following studies were reviewed today: Prior office notes EKG lab work  EKG: 11/26/2019-sinus rhythm 69 with occasional PACs personally reviewed.  10/17/2018-sinus rhythm left bundle branch blocklike appearance no other changes made.  Personally reviewed and interpreted  Recent Labs: No results found for requested labs within last 8760 hours.  Recent Lipid Panel    Component Value Date/Time   CHOL 204 (HH) 01/18/2009 0940   TRIG 109 01/18/2009 0940   HDL 59.1 01/18/2009 0940   CHOLHDL 3.5 CALC 01/18/2009 0940   VLDL 22 01/18/2009 0940   LDLCALC  10/03/2007 0300    92        Total Cholesterol/HDL:CHD Risk Coronary Heart Disease Risk Table                     Men   Women  1/2 Average Risk   3.4   3.3   LDLDIRECT 121.3 01/18/2009 0940    Physical Exam:    VS:  BP  110/76   Pulse 69   Ht 5\' 5"  (1.651 m)   Wt 185 lb 3.2 oz (84 kg)   SpO2 94%   BMI 30.82 kg/m     Wt Readings from Last 3 Encounters:  11/26/19 185 lb 3.2 oz (84 kg)  11/10/18 173 lb (78.5 kg)  10/17/18 185 lb (83.9 kg)     GEN: Well nourished, well developed, in no acute  distress  HEENT: normal  Neck: no JVD, carotid bruits, or masses Cardiac: CABG scar RRR; no murmurs, rubs, or gallops,no edema  Respiratory:  clear to auscultation bilaterally, normal work of breathing GI: soft, nontender, nondistended, + BS MS: no deformity or atrophy  Skin: warm and dry, no rash Neuro:  Alert and Oriented x 3, Strength and sensation are intact Psych: euthymic mood, full affect   ASSESSMENT:    1. Coronary artery disease involving native coronary artery of native heart without angina pectoris   2. Polymyalgia rheumatica (HCC)   3. Statin intolerance   4. Mixed hyperlipidemia    PLAN:    In order of problems listed above:  Coronary artery disease status post CABG with 3 PCI - Overall doing well without any anginal symptoms.  No aspirin.  Plavix 75 mg only.  Seems to be doing quite well.  Hyperlipidemia -Continue with aggressive secondary prevention.  He is tolerating Zocor 40 mg well.  He did not tolerate other statins.  LDL 87.  Optimally would be great for him to be less than 70.  No changes made.  Polymyalgia rheumatica - Methotrexate.    On prednisone as well. Stable.   History of intracranial hemorrhage thalamic - Now Plavix only.  Does have occasional easy bruising.  Carotid artery disease mild -Continue to monitor clinically.  Secondary prevention.  Currently on Plavix and simvastatin.   Medication Adjustments/Labs and Tests Ordered: Current medicines are reviewed at length with the patient today.  Concerns regarding medicines are outlined above.  No orders of the defined types were placed in this encounter.  No orders of the defined types were placed in this  encounter.   Patient Instructions  Medication Instructions:  The current medical regimen is effective;  continue present plan and medications.  *If you need a refill on your cardiac medications before your next appointment, please call your pharmacy*  Follow-Up: At [facility], you and your health needs are our priority.  As part of our continuing mission to provide you with exceptional heart care, we have created designated Provider Care Teams.  These Care Teams include your primary Cardiologist (physician) and Advanced Practice Providers (APPs -  Physician Assistants and Nurse Practitioners) who all work together to provide you with the care you need, when you need it.  Your next appointment:   12 month(s)  The format for your next appointment:   In Person  Provider:   Candee Furbish, MD      Signed, Candee Furbish, MD  11/26/2019 9:11 AM    [place]

## 2019-12-03 NOTE — Addendum Note (Signed)
Addended by: Maren Beach, Gunter Conde A on: 12/03/2019 03:03 PM   Modules accepted: Orders

## 2019-12-23 ENCOUNTER — Inpatient Hospital Stay (HOSPITAL_COMMUNITY)
Admission: EM | Admit: 2019-12-23 | Discharge: 2019-12-25 | DRG: 312 | Disposition: A | Discharge status: Home / Self Care | Payer: Medicare HMO | Attending: Family Medicine | Admitting: Family Medicine

## 2019-12-23 ENCOUNTER — Emergency Department (HOSPITAL_COMMUNITY): Payer: Medicare HMO

## 2019-12-23 ENCOUNTER — Encounter (HOSPITAL_COMMUNITY): Payer: Self-pay | Admitting: *Deleted

## 2019-12-23 DIAGNOSIS — R9431 Abnormal electrocardiogram [ECG] [EKG]: Secondary | ICD-10-CM | POA: Diagnosis not present

## 2019-12-23 DIAGNOSIS — R231 Pallor: Secondary | ICD-10-CM | POA: Diagnosis not present

## 2019-12-23 DIAGNOSIS — E785 Hyperlipidemia, unspecified: Secondary | ICD-10-CM | POA: Diagnosis present

## 2019-12-23 DIAGNOSIS — Z8249 Family history of ischemic heart disease and other diseases of the circulatory system: Secondary | ICD-10-CM

## 2019-12-23 DIAGNOSIS — N179 Acute kidney failure, unspecified: Secondary | ICD-10-CM | POA: Diagnosis present

## 2019-12-23 DIAGNOSIS — Z8673 Personal history of transient ischemic attack (TIA), and cerebral infarction without residual deficits: Secondary | ICD-10-CM | POA: Diagnosis not present

## 2019-12-23 DIAGNOSIS — Z66 Do not resuscitate: Secondary | ICD-10-CM | POA: Diagnosis present

## 2019-12-23 DIAGNOSIS — Z20822 Contact with and (suspected) exposure to covid-19: Secondary | ICD-10-CM | POA: Diagnosis present

## 2019-12-23 DIAGNOSIS — M069 Rheumatoid arthritis, unspecified: Secondary | ICD-10-CM | POA: Diagnosis present

## 2019-12-23 DIAGNOSIS — R197 Diarrhea, unspecified: Secondary | ICD-10-CM | POA: Diagnosis present

## 2019-12-23 DIAGNOSIS — R4020 Unspecified coma: Secondary | ICD-10-CM | POA: Diagnosis not present

## 2019-12-23 DIAGNOSIS — Z885 Allergy status to narcotic agent status: Secondary | ICD-10-CM | POA: Diagnosis not present

## 2019-12-23 DIAGNOSIS — Z7902 Long term (current) use of antithrombotics/antiplatelets: Secondary | ICD-10-CM | POA: Diagnosis not present

## 2019-12-23 DIAGNOSIS — Z955 Presence of coronary angioplasty implant and graft: Secondary | ICD-10-CM

## 2019-12-23 DIAGNOSIS — Z7952 Long term (current) use of systemic steroids: Secondary | ICD-10-CM | POA: Diagnosis not present

## 2019-12-23 DIAGNOSIS — M353 Polymyalgia rheumatica: Secondary | ICD-10-CM | POA: Diagnosis present

## 2019-12-23 DIAGNOSIS — I2510 Atherosclerotic heart disease of native coronary artery without angina pectoris: Secondary | ICD-10-CM | POA: Diagnosis present

## 2019-12-23 DIAGNOSIS — Z79899 Other long term (current) drug therapy: Secondary | ICD-10-CM | POA: Diagnosis not present

## 2019-12-23 DIAGNOSIS — R531 Weakness: Secondary | ICD-10-CM

## 2019-12-23 DIAGNOSIS — I10 Essential (primary) hypertension: Secondary | ICD-10-CM | POA: Diagnosis present

## 2019-12-23 DIAGNOSIS — R55 Syncope and collapse: Secondary | ICD-10-CM

## 2019-12-23 DIAGNOSIS — K219 Gastro-esophageal reflux disease without esophagitis: Secondary | ICD-10-CM | POA: Diagnosis present

## 2019-12-23 DIAGNOSIS — I951 Orthostatic hypotension: Secondary | ICD-10-CM | POA: Diagnosis present

## 2019-12-23 DIAGNOSIS — I252 Old myocardial infarction: Secondary | ICD-10-CM

## 2019-12-23 DIAGNOSIS — Z951 Presence of aortocoronary bypass graft: Secondary | ICD-10-CM

## 2019-12-23 DIAGNOSIS — R0902 Hypoxemia: Secondary | ICD-10-CM | POA: Diagnosis not present

## 2019-12-23 DIAGNOSIS — G9340 Encephalopathy, unspecified: Secondary | ICD-10-CM | POA: Diagnosis present

## 2019-12-23 DIAGNOSIS — Z87892 Personal history of anaphylaxis: Secondary | ICD-10-CM | POA: Diagnosis not present

## 2019-12-23 DIAGNOSIS — I4581 Long QT syndrome: Secondary | ICD-10-CM | POA: Diagnosis not present

## 2019-12-23 DIAGNOSIS — E860 Dehydration: Secondary | ICD-10-CM | POA: Diagnosis present

## 2019-12-23 DIAGNOSIS — R42 Dizziness and giddiness: Secondary | ICD-10-CM | POA: Diagnosis not present

## 2019-12-23 LAB — BASIC METABOLIC PANEL
Anion gap: 11 (ref 5–15)
BUN: 28 mg/dL — ABNORMAL HIGH (ref 8–23)
CO2: 24 mmol/L (ref 22–32)
Calcium: 8.8 mg/dL — ABNORMAL LOW (ref 8.9–10.3)
Chloride: 104 mmol/L (ref 98–111)
Creatinine, Ser: 1.49 mg/dL — ABNORMAL HIGH (ref 0.61–1.24)
GFR calc Af Amer: 53 mL/min — ABNORMAL LOW (ref >60)
GFR calc non Af Amer: 46 mL/min — ABNORMAL LOW (ref >60)
Glucose, Bld: 101 mg/dL — ABNORMAL HIGH (ref 70–99)
Potassium: 3.6 mmol/L (ref 3.5–5.1)
Sodium: 139 mmol/L (ref 135–145)

## 2019-12-23 LAB — CBC
HCT: 38 % — ABNORMAL LOW (ref 39.0–52.0)
Hemoglobin: 12.4 g/dL — ABNORMAL LOW (ref 13.0–17.0)
MCH: 31.6 pg (ref 26.0–34.0)
MCHC: 32.6 g/dL (ref 30.0–36.0)
MCV: 96.9 fL (ref 80.0–100.0)
Platelets: 206 10*3/uL (ref 150–400)
RBC: 3.92 MIL/uL — ABNORMAL LOW (ref 4.22–5.81)
RDW: 13.8 % (ref 11.5–15.5)
WBC: 6.2 10*3/uL (ref 4.0–10.5)
nRBC: 0 % (ref 0.0–0.2)

## 2019-12-23 LAB — URINALYSIS, ROUTINE W REFLEX MICROSCOPIC
Bacteria, UA: NONE SEEN
Bilirubin Urine: NEGATIVE
Glucose, UA: NEGATIVE mg/dL
Hgb urine dipstick: NEGATIVE
Ketones, ur: NEGATIVE mg/dL
Leukocytes,Ua: NEGATIVE
Nitrite: NEGATIVE
Protein, ur: 30 mg/dL — AB
Specific Gravity, Urine: 1.021 (ref 1.005–1.030)
pH: 5 (ref 5.0–8.0)

## 2019-12-23 LAB — CBG MONITORING, ED: Glucose-Capillary: 88 mg/dL (ref 70–99)

## 2019-12-23 LAB — MAGNESIUM: Magnesium: 1.8 mg/dL (ref 1.7–2.4)

## 2019-12-23 LAB — TROPONIN I (HIGH SENSITIVITY): Troponin I (High Sensitivity): 5 ng/L (ref <18)

## 2019-12-23 LAB — SARS CORONAVIRUS 2 (TAT 6-24 HRS): SARS Coronavirus 2: NEGATIVE

## 2019-12-23 MED ORDER — SODIUM CHLORIDE 0.9 % IV SOLN: INTRAVENOUS | Status: DC

## 2019-12-23 MED ORDER — ENOXAPARIN SODIUM 40 MG/0.4ML ~~LOC~~ SOLN
40.0000 mg | SUBCUTANEOUS | Status: DC
  Administered 2019-12-23: 40 mg via SUBCUTANEOUS
  Filled 2019-12-23: qty 0.4

## 2019-12-23 MED ORDER — PREDNISONE 5 MG PO TABS
5.0000 mg | ORAL_TABLET | Freq: Every day | ORAL | Status: DC
  Administered 2019-12-23 – 2019-12-25 (×3): 5 mg via ORAL
  Filled 2019-12-23 (×3): qty 1

## 2019-12-23 MED ORDER — FOLIC ACID 1 MG PO TABS
2.0000 mg | ORAL_TABLET | Freq: Every day | ORAL | Status: DC
  Administered 2019-12-23 – 2019-12-25 (×3): 2 mg via ORAL
  Filled 2019-12-23 (×3): qty 2

## 2019-12-23 MED ORDER — CLOPIDOGREL BISULFATE 75 MG PO TABS
75.0000 mg | ORAL_TABLET | Freq: Every day | ORAL | Status: DC
  Administered 2019-12-23 – 2019-12-25 (×3): 75 mg via ORAL
  Filled 2019-12-23 (×3): qty 1

## 2019-12-23 MED ORDER — SODIUM CHLORIDE 0.9 % IV BOLUS
500.0000 mL | Freq: Once | INTRAVENOUS | Status: AC
  Administered 2019-12-23: 500 mL via INTRAVENOUS

## 2019-12-23 MED ORDER — SODIUM CHLORIDE 0.9% FLUSH: 3.0000 mL | Freq: Once | INTRAVENOUS | Status: DC

## 2019-12-23 MED ORDER — SIMVASTATIN 20 MG PO TABS
40.0000 mg | ORAL_TABLET | Freq: Every day | ORAL | Status: DC
  Administered 2019-12-23 – 2019-12-24 (×2): 40 mg via ORAL
  Filled 2019-12-23 (×2): qty 2

## 2019-12-23 NOTE — ED Notes (Signed)
Pt to and from CT via cart. Pt conscious, breathing, and A&Ox4. No distress noted.

## 2019-12-23 NOTE — ED Notes (Signed)
Pt. was ambulated in the hallway, pt denies any SOB or dizziness .

## 2019-12-23 NOTE — ED Notes (Signed)
Brought pt a bedside commode to attempt to give a stool sample

## 2019-12-23 NOTE — H&P (Addendum)
TRH H&P    Patient Demographics:    Russell Hebert, is a 74 y.o. male  MRN: [id number]  DOB - [birth year]  Admit Date - 12/23/2019  Referring MD/NP/PA: Dr. Eulis Foster  Outpatient Primary MD for the patient is Alroy Dust, L.Marlou Sa, MD  Patient coming from: Home  Chief complaint-passed out   HPI:    Russell Hebert  is a 74 y.o. male, with history of CAD s/p CABG in 2000, hypertension, hyperlipidemia, TIA, GERD came to hospital after patient had dizziness and passed out at a local bank.  Patient says that he has been having diarrhea for past 3 weeks, usually 1-2 episodes of loose BM.  He denies taking any antibiotics recently.  Today he went to visit his daughter who works at a [employer], [employer].  He felt dizzy on walking was able to get to the building but passed out while sitting in the chair.  He had loss of consciousness.  Patient denies chest pain.  EMS found him to be hypotensive, treated with IV fluids with improvement. He denies previous history of seizures. Denies bowel or bladder incontinence today. Denies biting his tongue. No history of jerking movements of extremities He denies nausea and vomiting.  In the ED, EKG showed prolonged QTC 528. BUN/creatinine 28/1.49, his baseline BUN/creatinine from 2009 was 16/1.2 His blood pressure was soft in the ED 89/65, improved with IV fluids.    Review of systems:    In addition to the HPI above,    All other systems reviewed and are negative.    Past History of the following :    Past Medical History:  Diagnosis Date  . Carotid artery disease ([place])    Doppler, December, 2010, 0-39% bilateral  . Coronary artery disease    Acute lateral MI October, 2008, bare-metal stent circumflex /  catheterization November, 2009, LIMA-LAD patent,, all vein grafts patent OM stent patent... some sluggish distal flow... medical therapy  . Ejection fraction   . GERD  (gastroesophageal reflux disease)   . Hx of CABG    2000  . Hypercholesterolemia    Simvastatin started 2009  . Hypertension   . 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 TIA (transient ischemic attack) 2006      Past Surgical History:  Procedure Laterality Date  . CORONARY ARTERY BYPASS GRAFT  2000   Acute lateral MI and bare-metal stent circumflex..(10/08) Cath 10/14/08; LIMA to the LAD patent ; all vein grafts patent; OM stent patent..some sluggish distal flow.      Social History:      Social History   Tobacco Use  . Smoking status: Never Smoker  . Smokeless tobacco: Never Used  Substance Use Topics  . Alcohol use: No       Family History :     Family History  Problem Relation Age of Onset  . Heart disease Mother   . Heart failure Mother   . Heart failure Father       Home Medications:  Prior to Admission medications   Medication Sig Start Date End Date Taking? Authorizing Provider  carvedilol (COREG) 3.125 MG tablet Take 3.125 mg by mouth daily.     [provider]  clopidogrel (PLAVIX) 75 MG tablet Take 75 mg by mouth daily.      [provider]  diazepam (VALIUM) 5 MG tablet Take 5 mg by mouth every 6 (six) hours as needed (for vertigo).     [provider]  folic acid (FOLVITE) 1 MG tablet Take 2 mg by mouth daily. 10/09/18   [provider]  hydrochlorothiazide (HYDRODIURIL) 12.5 MG tablet Take 12.5 mg by mouth daily.      [provider]  losartan (COZAAR) 50 MG tablet Take 50 mg by mouth daily.      [provider]  Magnesium 200 MG TABS Take 200 mg by mouth daily.    [provider]  methotrexate (RHEUMATREX) 2.5 MG tablet Take 8 tablets by mouth once a week. 09/10/18   [provider]  omeprazole (PRILOSEC) 20 MG capsule Take 20 mg by mouth daily.      [provider]  predniSONE (DELTASONE) 5 MG tablet Take 5 mg by  mouth daily.  12/21/15   [provider]  simvastatin (ZOCOR) 40 MG tablet Take 40 mg by mouth at bedtime.      [provider]  traMADol (ULTRAM) 50 MG tablet Take 1 tablet (50 mg total) by mouth every 6 (six) hours as needed. 05/30/19   Robinson, [place] N, PA-C     Allergies:     Allergies  Allergen Reactions  . Morphine Anaphylaxis     Physical Exam:   Vitals  Blood pressure 108/73, pulse 61, temperature 97.8 F (36.6 C), temperature source Oral, resp. rate 17, weight 83.9 kg, SpO2 96 %.  1.  General: Appears in no acute distress  2. Psychiatric: Alert, oriented x3, intact insight and judgment  3. Neurologic: Cranial nerves II through XII grossly intact, moving all extremities, no focal deficit noted  4. HEENMT:  Atraumatic normocephalic, extraocular muscles are intact  5. Respiratory : Clear to auscultation bilaterally, no wheezing or crackles auscultated  6. Cardiovascular : S1-S2, regular, no murmur auscultated, no edema in the lower extremities  7. Gastrointestinal:  Abdomen is soft, nontender, no organomegaly      Data Review:    CBC Recent Labs  Lab 12/23/19 1321  WBC 6.2  HGB 12.4*  HCT 38.0*  PLT 206  MCV 96.9  MCH 31.6  MCHC 32.6  RDW 13.8   ------------------------------------------------------------------------------------------------------------------  Results for orders placed or performed during the hospital encounter of 12/23/19 (from the past 48 hour(s))  Basic metabolic panel     Status: Abnormal   Collection Time: 12/23/19  1:21 PM  Result Value Ref Range   Sodium 139 135 - 145 mmol/L   Potassium 3.6 3.5 - 5.1 mmol/L   Chloride 104 98 - 111 mmol/L   CO2 24 22 - 32 mmol/L   Glucose, Bld 101 (H) 70 - 99 mg/dL   BUN 28 (H) 8 - 23 mg/dL   Creatinine, Ser 1.49 (H) 0.61 - 1.24 mg/dL   Calcium 8.8 (L) 8.9 - 10.3 mg/dL   GFR calc non Af Amer 46 (L) >60 mL/min   GFR calc Af Amer 53 (L) >60 mL/min   Anion gap 11 5 -  15    Comment: Performed at [facility], [address] [place], [place] [address]  CBC     Status: Abnormal   Collection Time: 12/23/19  1:21 PM  Result Value Ref Range   WBC 6.2 4.0 - 10.5 K/uL   RBC 3.92 (L) 4.22 - 5.81 MIL/uL   Hemoglobin 12.4 (L) 13.0 - 17.0 g/dL   HCT 38.0 (L) 39.0 - 52.0 %   MCV 96.9 80.0 - 100.0 fL   MCH 31.6 26.0 - 34.0 pg   MCHC 32.6 30.0 - 36.0 g/dL   RDW 13.8 11.5 - 15.5 %   Platelets 206 150 - 400 K/uL   nRBC 0.0 0.0 - 0.2 %    Comment: Performed at [facility], [place] [address], [place], [place] [address]  CBG monitoring, ED     Status: None   Collection Time: 12/23/19  1:27 PM  Result Value Ref Range   Glucose-Capillary 88 70 - 99 mg/dL  Urinalysis, Routine w reflex microscopic     Status: Abnormal   Collection Time: 12/23/19  2:30 PM  Result Value Ref Range   Color, Urine YELLOW YELLOW   APPearance HAZY (A) CLEAR   Specific Gravity, Urine 1.021 1.005 - 1.030   pH 5.0 5.0 - 8.0   Glucose, UA NEGATIVE NEGATIVE mg/dL   Hgb urine dipstick NEGATIVE NEGATIVE   Bilirubin Urine NEGATIVE NEGATIVE   Ketones, ur NEGATIVE NEGATIVE mg/dL   Protein, ur 30 (A) NEGATIVE mg/dL   Nitrite NEGATIVE NEGATIVE   Leukocytes,Ua NEGATIVE NEGATIVE   RBC / HPF 0-5 0 - 5 RBC/hpf   WBC, UA 0-5 0 - 5 WBC/hpf   Bacteria, UA NONE SEEN NONE SEEN   Mucus PRESENT    Hyaline Casts, UA PRESENT    Granular Casts, UA PRESENT     Comment: Performed at [facility], [address], [place], [place] [address]    Chemistries  Recent Labs  Lab 12/23/19 1321  NA 139  K 3.6  CL 104  CO2 24  GLUCOSE 101*  BUN 28*  CREATININE 1.49*  CALCIUM 8.8*   ------------------------------------------------------------------------------------------------------------------  ------------------------------------------------------------------------------------------------------------------ GFR: Estimated Creatinine Clearance: 43.4 mL/min (A) (by C-G formula based on SCr  of 1.49 mg/dL (H)). Liver Function Tests: No results for input(s): AST, ALT, ALKPHOS, BILITOT, PROT, ALBUMIN in the last 168 hours. No results for input(s): LIPASE, AMYLASE in the last 168 hours. No results for input(s): AMMONIA in the last 168 hours. Coagulation Profile: No results for input(s): INR, PROTIME in the last 168 hours. Cardiac Enzymes: No results for input(s): CKTOTAL, CKMB, CKMBINDEX, TROPONINI in the last 168 hours. BNP (last 3 results) No results for input(s): PROBNP in the last 8760 hours. HbA1C: No results for input(s): HGBA1C in the last 72 hours. CBG: Recent Labs  Lab 12/23/19 1327  GLUCAP 88   Lipid Profile: No results for input(s): CHOL, HDL, LDLCALC, TRIG, CHOLHDL, LDLDIRECT in the last 72 hours. Thyroid Function Tests: No results for input(s): TSH, T4TOTAL, FREET4, T3FREE, THYROIDAB in the last 72 hours. Anemia Panel: No results for input(s): VITAMINB12, FOLATE, FERRITIN, TIBC, IRON, RETICCTPCT in the last 72 hours.  --------------------------------------------------------------------------------------------------------------- Urine analysis:    Component Value Date/Time   COLORURINE YELLOW 12/23/2019 1430   APPEARANCEUR HAZY (A) 12/23/2019 1430   LABSPEC 1.021 12/23/2019 1430   PHURINE 5.0 12/23/2019 1430   GLUCOSEU NEGATIVE 12/23/2019 1430   HGBUR NEGATIVE 12/23/2019 1430   BILIRUBINUR NEGATIVE 12/23/2019 1430   KETONESUR NEGATIVE 12/23/2019 1430   PROTEINUR 30 (A) 12/23/2019 1430   NITRITE NEGATIVE 12/23/2019 1430   LEUKOCYTESUR  NEGATIVE 12/23/2019 1430      Imaging Results:    CT Head Wo Contrast  Result Date: 12/23/2019 CLINICAL DATA:  Dizziness.  Hypotension. EXAM: CT HEAD WITHOUT CONTRAST TECHNIQUE: Contiguous axial images were obtained from the base of the skull through the vertex without intravenous contrast. COMPARISON:  10/17/2018 FINDINGS: Brain: Moderate low density in the periventricular white matter likely related to small vessel  disease. Right thalamic remote lacunar infarct. No mass lesion, hemorrhage, hydrocephalus, acute infarct, intra-axial, or extra-axial fluid collection. Vascular: Intracranial atherosclerosis. Skull: Normal Sinuses/Orbits: Normal imaged portions of the orbits and globes. Mucosal thickening of the left maxillary sinus. Mild ethmoid air cell mucosal thickening. Clear mastoid air cells. Other: None. IMPRESSION: 1.  No acute intracranial abnormality. 2. Moderate small vessel ischemic change. 3. Sinus disease Electronically Signed   By: Abigail Miyamoto M.D.   On: 12/23/2019 15:57    My personal review of EKG: Rhythm NSR, QTC 528   Assessment & Plan:    Active Problems:   Syncope   1. Syncope versus seizure-patient likely has syncope, blood pressure was low at the time of presentation also has elevated BUN/creatinine.  Patient is on multiple antihypertensive medications at home.  He takes Cozaar, Coreg, hydrochlorothiazide.  We will hold his medications at this time.  Obtain echocardiogram in a.m., troponin x2, monitor on telemetry, obtain EEG. 2. Prolonged QTC-  QTC is prolonged seen on the EKG, will check serum magnesium level. 3. Acute kidney injury-patient has elevated BUN/creatinine, likely from diarrhea.  Started on gentle IV hydration.  Follow BMP in a.m. 4. Diarrhea-patient has been having 1-2 loose BM for past 2 to 3 weeks.  Stool for C. difficile has been ordered in the ED.  Also hold p.o. magnesium as it can cause diarrhea. 5. Rheumatoid arthritis, continue prednisone 5 mg daily, patient takes methotrexate 5 mg once a week. 6. CAD s/p CABG in 2000-continue Plavix, Zocor    DVT Prophylaxis-   Lovenox   AM Labs Ordered, also please review Full Orders  Family Communication: Admission, patients condition and plan of care including tests being ordered have been discussed with the patient who indicate understanding and agree with the plan and Code Status.  Code Status: DNR  Admission status:  Observation-  Based on patients clinical presentation and evaluation of above clinical data, I have made determination that patient will need less than 2 midnight stay in the hospital.  Time spent in minutes : 60 min   Roemello Speyer S Shakim Faith M.D

## 2019-12-23 NOTE — ED Notes (Signed)
Pt has had some diarrhea for the past 2 weeks per ems

## 2019-12-23 NOTE — ED Notes (Signed)
Urinal given, pt advised that urine sample is needed and that if he should feel the need to have a BM we will plan to collect a stool sample also.  Call bell in reach

## 2019-12-23 NOTE — ED Provider Notes (Signed)
[facility] EMERGENCY DEPARTMENT Provider Note   CSN: [id number] Arrival date & time: 12/23/19  1236     History Chief Complaint  Patient presents with  . Loss of Consciousness    Russell Hebert is a 74 y.o. male.  HPI He presents for evaluation of general weakness, followed by dizziness with syncope, today.  He is reportedly had diarrhea for between one and 3 weeks.  It is unclear how many times each day he has had it.  Today, he was going to visit his daughter where she works at a [employer], academic, when he felt dizzy on walking, was able to get to the building, but passed out while sitting in a chair shortly after that.  He reportedly had 5 minutes of unconsciousness.  Subsequent to awakening, he complained of chest pain that has resolved.  EMS found him to be hypotensive, treated him with IV fluids with improvement.  Patient is a somewhat poor historian.  He denies vomiting, or change in his ability to speak.  There are no known other recent illnesses.  There are no other known modifying factors.    Past Medical History:  Diagnosis Date  . Carotid artery disease ([place])    Doppler, December, 2010, 0-39% bilateral  . Coronary artery disease    Acute lateral MI October, 2008, bare-metal stent circumflex /  catheterization November, 2009, LIMA-LAD patent,, all vein grafts patent OM stent patent... some sluggish distal flow... medical therapy  . Ejection fraction   . GERD (gastroesophageal reflux disease)   . Hx of CABG    2000  . Hypercholesterolemia    Simvastatin started 2009  . Hypertension   . 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 TIA (transient ischemic attack) 2006    Patient Active Problem List   Diagnosis Date Noted  . PMR (polymyalgia rheumatica) ([facility]) 02/08/2016  . Ejection fraction   . Coronary artery disease   . Hypertension   . Hypercholesterolemia   . Intracerebral bleed ([place])   . TIA  (transient ischemic attack)   . GERD (gastroesophageal reflux disease)   . Hx of CABG   . Carotid artery disease [facility])     Past Surgical History:  Procedure Laterality Date  . CORONARY ARTERY BYPASS GRAFT  2000   Acute lateral MI and bare-metal stent circumflex..(10/08) Cath 10/14/08; LIMA to the LAD patent ; all vein grafts patent; OM stent patent..some sluggish distal flow.       Family History  Problem Relation Age of Onset  . Heart disease Mother   . Heart failure Mother   . Heart failure Father     Social History   Tobacco Use  . Smoking status: Never Smoker  . Smokeless tobacco: Never Used  Substance Use Topics  . Alcohol use: No  . Drug use: No    Home Medications Prior to Admission medications   Medication Sig Start Date End Date Taking? Authorizing Provider  carvedilol (COREG) 3.125 MG tablet Take 3.125 mg by mouth daily.     [provider]  clopidogrel (PLAVIX) 75 MG tablet Take 75 mg by mouth daily.      [provider]  diazepam (VALIUM) 5 MG tablet Take 5 mg by mouth every 6 (six) hours as needed (for vertigo).     [provider]  folic acid (FOLVITE) 1 MG tablet Take 2 mg by mouth daily. 10/09/18   [provider]  hydrochlorothiazide (HYDRODIURIL) 12.5 MG tablet Take 12.5 mg by mouth daily.      [provider]  losartan (COZAAR) 50 MG tablet Take 50 mg by mouth daily.      [provider]  Magnesium 200 MG TABS Take 200 mg by mouth daily.    [provider]  methotrexate (RHEUMATREX) 2.5 MG tablet Take 8 tablets by mouth once a week. 09/10/18   [provider]  omeprazole (PRILOSEC) 20 MG capsule Take 20 mg by mouth daily.      [provider]  predniSONE (DELTASONE) 5 MG tablet Take 5 mg by mouth daily.  12/21/15   [provider]  simvastatin (ZOCOR) 40 MG tablet Take 40 mg by mouth at bedtime.      [provider]  traMADol (ULTRAM) 50 MG tablet Take 1  tablet (50 mg total) by mouth every 6 (six) hours as needed. 05/30/19   Robinson, [place] N, PA-C    Allergies    Morphine  Review of Systems   Review of Systems  All other systems reviewed and are negative.   Physical Exam Updated Vital Signs BP 108/73   Pulse 61   Temp 97.8 F (36.6 C) (Oral)   Resp 17   Wt 83.9 kg   SpO2 96%   BMI 30.79 kg/m   Physical Exam Vitals and nursing note reviewed.  Constitutional:      General: He is not in acute distress.    Appearance: He is well-developed. He is not ill-appearing, toxic-appearing or diaphoretic.  HENT:     Head: Normocephalic and atraumatic.     Right Ear: External ear normal.     Left Ear: External ear normal.     Nose: Nose normal.     Mouth/Throat:     Mouth: Mucous membranes are dry.     Pharynx: No oropharyngeal exudate or posterior oropharyngeal erythema.  Eyes:     Conjunctiva/sclera: Conjunctivae normal.     Pupils: Pupils are equal, round, and reactive to light.  Neck:     Trachea: Phonation normal.  Cardiovascular:     Rate and Rhythm: Normal rate and regular rhythm.     Heart sounds: Normal heart sounds.  Pulmonary:     Effort: Pulmonary effort is normal.     Breath sounds: Normal breath sounds.  Abdominal:     Palpations: Abdomen is soft.     Tenderness: There is no abdominal tenderness.  Musculoskeletal:        General: Normal range of motion.     Cervical back: Normal range of motion and neck supple.  Skin:    General: Skin is warm and dry.  Neurological:     Mental Status: He is alert.     Cranial Nerves: No cranial nerve deficit.     Motor: No abnormal muscle tone.     Coordination: Coordination normal.     Comments: Dysarthria is present.  There is no aphasia.  No facial asymmetry.  Mild weakness, left leg relative to right.  He cannot hold the left leg off the stretcher for more than 3 seconds without it falling.  No pronator drift of the arms.  Psychiatric:        Mood and Affect: Mood  normal.        Behavior: Behavior normal.     ED Results / Procedures / Treatments   Labs (all labs ordered are listed, but only abnormal results are displayed) Labs Reviewed  BASIC METABOLIC PANEL -  Abnormal; Notable for the following components:      Result Value   Glucose, Bld 101 (*)    BUN 28 (*)    Creatinine, Ser 1.49 (*)    Calcium 8.8 (*)    GFR calc non Af Amer 46 (*)    GFR calc Af Amer 53 (*)    All other components within normal limits  CBC - Abnormal; Notable for the following components:   RBC 3.92 (*)    Hemoglobin 12.4 (*)    HCT 38.0 (*)    All other components within normal limits  URINALYSIS, ROUTINE W REFLEX MICROSCOPIC - Abnormal; Notable for the following components:   APPearance HAZY (*)    Protein, ur 30 (*)    All other components within normal limits  STOOL CULTURE  C DIFFICILE QUICK SCREEN W PCR REFLEX  SARS CORONAVIRUS 2 (TAT 6-24 HRS)  CBG MONITORING, ED    EKG EKG Interpretation  Date/Time:  Wednesday December 23 2019 12:52:55 EST Ventricular Rate:  57 PR Interval:    QRS Duration: 93 QT Interval:  572 QTC Calculation: 558 R Axis:   54 Text Interpretation: Sinus rhythm Low voltage, extremity leads Prolonged QT interval Since last tracing QT has lengthened Otherwise no significant change Confirmed by Daleen Bo [phone]) on 12/23/2019 2:31:51 PM   Radiology CT Head Wo Contrast  Result Date: 12/23/2019 CLINICAL DATA:  Dizziness.  Hypotension. EXAM: CT HEAD WITHOUT CONTRAST TECHNIQUE: Contiguous axial images were obtained from the base of the skull through the vertex without intravenous contrast. COMPARISON:  10/17/2018 FINDINGS: Brain: Moderate low density in the periventricular white matter likely related to small vessel disease. Right thalamic remote lacunar infarct. No mass lesion, hemorrhage, hydrocephalus, acute infarct, intra-axial, or extra-axial fluid collection. Vascular: Intracranial atherosclerosis. Skull: Normal  Sinuses/Orbits: Normal imaged portions of the orbits and globes. Mucosal thickening of the left maxillary sinus. Mild ethmoid air cell mucosal thickening. Clear mastoid air cells. Other: None. IMPRESSION: 1.  No acute intracranial abnormality. 2. Moderate small vessel ischemic change. 3. Sinus disease Electronically Signed   By: Abigail Miyamoto M.D.   On: 12/23/2019 15:57    Procedures .Critical Care Performed by: Daleen Bo, MD Authorized by: Daleen Bo, MD   Critical care provider statement:    Critical care time (minutes):  35   Critical care start time:  12/23/2019 1:30 PM   Critical care end time:  12/23/2019 5:13 PM   Critical care time was exclusive of:  Separately billable procedures and treating other patients   Critical care was time spent personally by me on the following activities:  Blood draw for specimens, development of treatment plan with patient or surrogate, discussions with consultants, evaluation of patient's response to treatment, examination of patient, obtaining history from patient or surrogate, ordering and performing treatments and interventions, ordering and review of laboratory studies, pulse oximetry, re-evaluation of patient's condition, review of old charts and ordering and review of radiographic studies   (including critical care time)  Medications Ordered in ED Medications  sodium chloride flush (NS) 0.9 % injection 3 mL (3 mLs Intravenous Not Given 12/23/19 1402)  0.9 %  sodium chloride infusion ( Intravenous New Bag/Given 12/23/19 1440)  sodium chloride 0.9 % bolus 500 mL (0 mLs Intravenous Stopped 12/23/19 1440)    ED Course  I have reviewed the triage vital signs and the nursing notes.  Pertinent labs & imaging results that were available during my care of the patient were reviewed by me and  considered in my medical decision making (see chart for details).  Clinical Course as of Dec 22 1708  Wed Dec 23, 2019  1359 Mild hypertension on arrival, IV  fluids ordered   [EW]  1430 I discussed the case with patient's daughter, Arbie Cookey, who gives history as documented. She is not sure exactly how much diarrhea he is having.   [EW]  1702 Normal  Urinalysis, Routine w reflex microscopic(!) [EW]  1703 Normal except glucose low, BUN high, creatinine high, calcium low, GFR low  Basic metabolic panel(!) [EW]  [id number] Normal except hemoglobin low  CBC(!) [EW]  1703 CT Head Wo Contrast [EW]  1704 No acute abnormality, interpreted by radiology.  CT Head Wo Contrast [EW]    Clinical Course User Index [EW] Daleen Bo, MD   MDM Rules/Calculators/A&P                       Patient Vitals for the past 24 hrs:  BP Temp Temp src Pulse Resp SpO2 Weight  12/23/19 1530 108/73 -- -- 61 17 96 % --  12/23/19 1400 100/65 -- -- (!) 55 17 97 % --  12/23/19 1345 99/84 -- -- (!) 53 16 100 % --  12/23/19 1255 (!) 89/65 97.8 F (36.6 C) Oral 61 16 97 % --  12/23/19 1240 -- -- -- -- -- -- 83.9 kg  12/23/19 1237 93/70 -- -- -- -- -- --    5:10 PM Reevaluation with update and discussion. After initial assessment and treatment, an updated evaluation reveals no change in clinical status, findings discussed and questions answered. Daleen Bo   Medical Decision Making: Syncope, with hypotension, and recent diarrhea. Prolonged QT on EKG, raises possibility of cardiac arrhythmia. Patient has history of stroke, no new stroke with CT imaging today. He has dysarthria that may or may not be related to his prior stroke, or not having his dentures in. Mild persistent hypertension in the ED. Hospitalist consulted for evaluation, and observation.  CRITICAL CARE-yes Performed by: Daleen Bo   Nursing Notes Reviewed/ Care Coordinated Applicable Imaging Reviewed Interpretation of Laboratory Data incorporated into ED treatment  5:10 PM-Consult complete with hospitalist. Patient case explained and discussed. He agrees to admit patient for further evaluation and  treatment. Call ended at 5:15 PM  Final Clinical Impression(s) / ED Diagnoses Final diagnoses:  Syncope, unspecified syncope type  Weakness  Prolonged Q-T interval on ECG    Rx / DC Orders ED Discharge Orders    None       Daleen Bo, MD 12/23/19 [phone]

## 2019-12-23 NOTE — ED Notes (Signed)
RN to RN report given to Ginger with no questions or concerns. Pt transported to 3E14C on continuous blood pressure, pulse ox, and cardiac monitor. No distress noted. All belongings with pt.

## 2019-12-23 NOTE — ED Triage Notes (Signed)
Pt was feeling weak this am when he got to his daughters work.  He walked in but then began to faint.  He was lowered into a chair where he became unresponsive for about 5 minutes. Pt had some CP (tightness) briefly after but has no pain at this time. Pt was intially hypotensive for ems (80/60 once they layed him down).  Pt had 50cc NS.  BP on arrival to ED is 93/70

## 2019-12-24 ENCOUNTER — Observation Stay (HOSPITAL_COMMUNITY): Payer: Medicare HMO

## 2019-12-24 ENCOUNTER — Other Ambulatory Visit: Payer: Self-pay

## 2019-12-24 DIAGNOSIS — Z8249 Family history of ischemic heart disease and other diseases of the circulatory system: Secondary | ICD-10-CM | POA: Diagnosis not present

## 2019-12-24 DIAGNOSIS — Z66 Do not resuscitate: Secondary | ICD-10-CM | POA: Diagnosis present

## 2019-12-24 DIAGNOSIS — R55 Syncope and collapse: Secondary | ICD-10-CM

## 2019-12-24 DIAGNOSIS — Z885 Allergy status to narcotic agent status: Secondary | ICD-10-CM | POA: Diagnosis not present

## 2019-12-24 DIAGNOSIS — I951 Orthostatic hypotension: Secondary | ICD-10-CM | POA: Diagnosis present

## 2019-12-24 DIAGNOSIS — I2510 Atherosclerotic heart disease of native coronary artery without angina pectoris: Secondary | ICD-10-CM | POA: Diagnosis present

## 2019-12-24 DIAGNOSIS — Z79899 Other long term (current) drug therapy: Secondary | ICD-10-CM | POA: Diagnosis not present

## 2019-12-24 DIAGNOSIS — Z7952 Long term (current) use of systemic steroids: Secondary | ICD-10-CM | POA: Diagnosis not present

## 2019-12-24 DIAGNOSIS — E785 Hyperlipidemia, unspecified: Secondary | ICD-10-CM | POA: Diagnosis present

## 2019-12-24 DIAGNOSIS — Z951 Presence of aortocoronary bypass graft: Secondary | ICD-10-CM | POA: Diagnosis not present

## 2019-12-24 DIAGNOSIS — Z20822 Contact with and (suspected) exposure to covid-19: Secondary | ICD-10-CM | POA: Diagnosis present

## 2019-12-24 DIAGNOSIS — M353 Polymyalgia rheumatica: Secondary | ICD-10-CM | POA: Diagnosis present

## 2019-12-24 DIAGNOSIS — R197 Diarrhea, unspecified: Secondary | ICD-10-CM | POA: Diagnosis present

## 2019-12-24 DIAGNOSIS — E860 Dehydration: Secondary | ICD-10-CM | POA: Diagnosis present

## 2019-12-24 DIAGNOSIS — M069 Rheumatoid arthritis, unspecified: Secondary | ICD-10-CM | POA: Diagnosis present

## 2019-12-24 DIAGNOSIS — N179 Acute kidney failure, unspecified: Secondary | ICD-10-CM | POA: Diagnosis present

## 2019-12-24 DIAGNOSIS — Z8673 Personal history of transient ischemic attack (TIA), and cerebral infarction without residual deficits: Secondary | ICD-10-CM | POA: Diagnosis not present

## 2019-12-24 DIAGNOSIS — Z87892 Personal history of anaphylaxis: Secondary | ICD-10-CM | POA: Diagnosis not present

## 2019-12-24 DIAGNOSIS — R9431 Abnormal electrocardiogram [ECG] [EKG]: Secondary | ICD-10-CM | POA: Diagnosis not present

## 2019-12-24 DIAGNOSIS — I10 Essential (primary) hypertension: Secondary | ICD-10-CM | POA: Diagnosis present

## 2019-12-24 DIAGNOSIS — K219 Gastro-esophageal reflux disease without esophagitis: Secondary | ICD-10-CM | POA: Diagnosis present

## 2019-12-24 DIAGNOSIS — Z7902 Long term (current) use of antithrombotics/antiplatelets: Secondary | ICD-10-CM | POA: Diagnosis not present

## 2019-12-24 DIAGNOSIS — G9340 Encephalopathy, unspecified: Secondary | ICD-10-CM | POA: Diagnosis present

## 2019-12-24 DIAGNOSIS — I252 Old myocardial infarction: Secondary | ICD-10-CM | POA: Diagnosis not present

## 2019-12-24 DIAGNOSIS — Z955 Presence of coronary angioplasty implant and graft: Secondary | ICD-10-CM | POA: Diagnosis not present

## 2019-12-24 LAB — COMPREHENSIVE METABOLIC PANEL
ALT: 17 U/L (ref 0–44)
AST: 18 U/L (ref 15–41)
Albumin: 2.7 g/dL — ABNORMAL LOW (ref 3.5–5.0)
Alkaline Phosphatase: 33 U/L — ABNORMAL LOW (ref 38–126)
Anion gap: 11 (ref 5–15)
BUN: 19 mg/dL (ref 8–23)
CO2: 20 mmol/L — ABNORMAL LOW (ref 22–32)
Calcium: 8.2 mg/dL — ABNORMAL LOW (ref 8.9–10.3)
Chloride: 110 mmol/L (ref 98–111)
Creatinine, Ser: 1.08 mg/dL (ref 0.61–1.24)
GFR calc Af Amer: >60 mL/min (ref >60)
GFR calc non Af Amer: >60 mL/min (ref >60)
Glucose, Bld: 88 mg/dL (ref 70–99)
Potassium: 3.6 mmol/L (ref 3.5–5.1)
Sodium: 141 mmol/L (ref 135–145)
Total Bilirubin: 0.6 mg/dL (ref 0.3–1.2)
Total Protein: 5.7 g/dL — ABNORMAL LOW (ref 6.5–8.1)

## 2019-12-24 LAB — CBC
HCT: 36.4 % — ABNORMAL LOW (ref 39.0–52.0)
Hemoglobin: 12.1 g/dL — ABNORMAL LOW (ref 13.0–17.0)
MCH: 31.4 pg (ref 26.0–34.0)
MCHC: 33.2 g/dL (ref 30.0–36.0)
MCV: 94.5 fL (ref 80.0–100.0)
Platelets: 205 10*3/uL (ref 150–400)
RBC: 3.85 MIL/uL — ABNORMAL LOW (ref 4.22–5.81)
RDW: 13.6 % (ref 11.5–15.5)
WBC: 4.5 10*3/uL (ref 4.0–10.5)
nRBC: 0 % (ref 0.0–0.2)

## 2019-12-24 LAB — ECHOCARDIOGRAM COMPLETE
Height: 68 in
Weight: 2792 oz

## 2019-12-24 LAB — C DIFFICILE QUICK SCREEN W PCR REFLEX
C Diff antigen: NEGATIVE
C Diff interpretation: NOT DETECTED
C Diff toxin: NEGATIVE

## 2019-12-24 NOTE — Progress Notes (Signed)
EEG complete - results pending 

## 2019-12-24 NOTE — Progress Notes (Deleted)
Notified Tylene Fantasia APP of Pt's heart rate and rhythm. Received orders for STAT EKG which indicated Afibb with RVR. Orders for IVP cardizem were received. Pt's rhythm mainted Afibb with rate <120 post administration. Pt appears comfortable. BP 100/70.

## 2019-12-24 NOTE — Progress Notes (Signed)
[facility] PROGRESS NOTE  Russell Hebert [id number] DOB: 1946-11-13 DOA: 12/23/2019 PCP: Alroy Dust, L.Marlou Sa, MD  Assessment/Plan:  #1. Syncope vs seizure. 3 week hx diarrhea in setting of decreased oral intake and anti-hypertensive meds. Not orthostatic but BP is on low end of normal. EEG results pending. Echo has been ordered.  -continue to hold home coreg, HCTZ, cozaar\ -monitor blood pressure -Monitor intake and output -Gentle IV fluids -Follow echo results -Follow EEG -Follow stool studies -PT eval  #2.  Acute kidney injury.  Creatinine 1.49 on admission.  Creatinine has improved this morning 1.08. -Continue to hold nephrotoxins -Continue gentle IV fluids -Monitor urine output -Recheck in the morning  #3.  Prolonged QT.  History of same however has lengthened.  No events on tele.  -Avoid QT prolongation medications -recheck in am  #4.  Diarrhea.  Patient reports a 3-week history of same.  C. difficile negative.  Stool culture pending.  Denies any recent antibiotic use.  Had loose stool during the night. Describes it as watery and brown. No abdominal pain and no nausea -follow stool studies -monitor electrolytes -gentle IV fluids -Continue to hold magnesium supplement  #5.  Rheumatoid arthritis.  Appears stable at baseline.  Continue home prednisone.  #6.  CAD status post CABG in 2000.  Denies chest pain.  High-sensitivity troponin 5.  EKG with sinus rhythm prolonged QT - Continue home meds  Code Status: DNR Family Communication: daughter on phone Disposition Plan: to be determined may need short term snf   Consultants:    Procedures:  echo  Antibiotics:    HPI/Subjective: Awake alert lying in bed watching TV.  Complains of fatigue and diarrhea during the night.  Denies any pain or nausea.  Objective: Vitals:   12/24/19 0037 12/24/19 0421  BP: 117/79 129/90  Pulse: 87 75  Resp: 18 19  Temp:  98.9 F (37.2 C)  SpO2: 99% 91%     Intake/Output Summary (Last 24 hours) at 12/24/2019 0934 Last data filed at 12/24/2019 0056 Gross per 24 hour  Intake 1343.56 ml  Output 650 ml  Net 693.56 ml   Filed Weights   12/23/19 1240 12/23/19 1835 12/24/19 0421  Weight: 83.9 kg 80.7 kg 79.2 kg    Exam:   General: Somewhat pale and frail appearing no acute distress  Cardiovascular: Regular rate and rhythm no murmur gallop or rub no lower extremity edema  Respiratory: Normal effort breath sounds are clear bilaterally I hear no crackles no wheezes  Abdomen: Soft nondistended positive bowel sounds throughout nontender to palpation no guarding or rebounding  Musculoskeletal: Joints without swelling/erythema full range of motion  Data Reviewed: Basic Metabolic Panel: Recent Labs  Lab 12/23/19 1321 12/23/19 2033 12/24/19 0447  NA 139  --  141  K 3.6  --  3.6  CL 104  --  110  CO2 24  --  20*  GLUCOSE 101*  --  88  BUN 28*  --  19  CREATININE 1.49*  --  1.08  CALCIUM 8.8*  --  8.2*  MG  --  1.8  --    Liver Function Tests: Recent Labs  Lab 12/24/19 0447  AST 18  ALT 17  ALKPHOS 33*  BILITOT 0.6  PROT 5.7*  ALBUMIN 2.7*   No results for input(s): LIPASE, AMYLASE in the last 168 hours. No results for input(s): AMMONIA in the last 168 hours. CBC: Recent Labs  Lab 12/23/19 1321 12/24/19 0447  WBC 6.2 4.5  HGB 12.4*  12.1*  HCT 38.0* 36.4*  MCV 96.9 94.5  PLT 206 205   Cardiac Enzymes: No results for input(s): CKTOTAL, CKMB, CKMBINDEX, TROPONINI in the last 168 hours. BNP (last 3 results) No results for input(s): BNP in the last 8760 hours.  ProBNP (last 3 results) No results for input(s): PROBNP in the last 8760 hours.  CBG: Recent Labs  Lab 12/23/19 1327  GLUCAP 88    Recent Results (from the past 240 hour(s))  C difficile quick scan w PCR reflex     Status: None   Collection Time: 12/23/19  3:00 PM   Specimen: STOOL  Result Value Ref Range Status   C Diff antigen NEGATIVE NEGATIVE  Final   C Diff toxin NEGATIVE NEGATIVE Final   C Diff interpretation No C. difficile detected.  Final    Comment: Performed at [facility], [place] [address] [place], [place] [address]  SARS CORONAVIRUS 2 (TAT 6-24 HRS) Nasopharyngeal Nasopharyngeal Swab     Status: None   Collection Time: 12/23/19  5:45 PM   Specimen: Nasopharyngeal Swab  Result Value Ref Range Status   SARS Coronavirus 2 NEGATIVE NEGATIVE Final    Comment: (NOTE) SARS-CoV-2 target nucleic acids are NOT DETECTED. The SARS-CoV-2 RNA is generally detectable in upper and lower respiratory specimens during the acute phase of infection. Negative results do not preclude SARS-CoV-2 infection, do not rule out co-infections with other pathogens, and should not be used as the sole basis for treatment or other patient management decisions. Negative results must be combined with clinical observations, patient history, and epidemiological information. The expected result is Negative. Fact Sheet for Patients: SugarRoll.be Fact Sheet for Healthcare Providers: https://www.woods-mathews.com/ This test is not yet approved or cleared by the [place] FDA and  has been authorized for detection and/or diagnosis of SARS-CoV-2 by FDA under an Emergency Use Authorization (EUA). This EUA will remain  in effect (meaning this test can be used) for the duration of the COVID-19 declaration under Section 56 4(b)(1) of the Act, 21 U.S.C. section 360bbb-3(b)(1), unless the authorization is terminated or revoked sooner. Performed at [facility], [place] [address] [place], [place] [address]      Studies: CT Head Wo Contrast  Result Date: 12/23/2019 CLINICAL DATA:  Dizziness.  Hypotension. EXAM: CT HEAD WITHOUT CONTRAST TECHNIQUE: Contiguous axial images were obtained from the base of the skull through the vertex without intravenous contrast. COMPARISON:  10/17/2018 FINDINGS: Brain: Moderate  low density in the periventricular white matter likely related to small vessel disease. Right thalamic remote lacunar infarct. No mass lesion, hemorrhage, hydrocephalus, acute infarct, intra-axial, or extra-axial fluid collection. Vascular: Intracranial atherosclerosis. Skull: Normal Sinuses/Orbits: Normal imaged portions of the orbits and globes. Mucosal thickening of the left maxillary sinus. Mild ethmoid air cell mucosal thickening. Clear mastoid air cells. Other: None. IMPRESSION: 1.  No acute intracranial abnormality. 2. Moderate small vessel ischemic change. 3. Sinus disease Electronically Signed   By: Abigail Miyamoto M.D.   On: 12/23/2019 15:57    Scheduled Meds: . clopidogrel  75 mg Oral Daily  . enoxaparin (LOVENOX) injection  40 mg Subcutaneous Q24H  . folic acid  2 mg Oral Daily  . predniSONE  5 mg Oral Daily  . simvastatin  40 mg Oral QHS  . sodium chloride flush  3 mL Intravenous Once   Continuous Infusions: . sodium chloride 75 mL/hr at 12/24/19 [id number]    Principal Problem:   Syncope Active Problems:   Coronary artery  disease   Hypertension   GERD (gastroesophageal reflux disease)    Time spent: 36 minutes   Logan NP [facility]  If 7PM-7AM, please contact night-coverage at [email or web address], password [email or web address] 12/24/2019, 9:34 AM  LOS: 0 days

## 2019-12-24 NOTE — Progress Notes (Signed)
*  PRELIMINARY RESULTS* Echocardiogram 2D Echocardiogram has been performed.  Leavy Cella 12/24/2019, 10:03 AM

## 2019-12-24 NOTE — Evaluation (Addendum)
Physical Therapy Evaluation Patient Details Name: Russell Hebert MRN: [id number] DOB: [birth year] Today's Date: 12/24/2019   History of Present Illness  Pt is a 74 y.o. M with significant PMH of CAD s/p CABG in 2000, hypertension, TIA, who was admitted with syncopal episode in setting of 3 weeks of diarrhea. Pt found to have AKI. EEG negative for seizures.   Clinical Impression  Pt admitted with above. Pt reports he is at his functional baseline. Ambulating 200 feet with no assistive device without physical difficulty. Denies dizziness/lightheadedness. HR 94 bpm, SpO2 96% on RA. Displays mild dynamic balance deficits and decreased gait speed. Don't anticipate need for PT follow up; will continue to follow acutely to promote mobility.     Follow Up Recommendations No PT follow up    Equipment Recommendations  None recommended by PT    Recommendations for Other Services       Precautions / Restrictions Precautions Precautions: Fall Restrictions Weight Bearing Restrictions: No      Mobility  Bed Mobility Overal bed mobility: Independent                Transfers Overall transfer level: Independent Equipment used: None                Ambulation/Gait Ambulation/Gait assistance: Supervision Gait Distance (Feet): 200 Feet Assistive device: None Gait Pattern/deviations: Step-through pattern;Decreased stride length;Decreased dorsiflexion - left Gait velocity: decreased   General Gait Details: Pt with decreased L arm reciprocal arm swing, heel strike (pt states baseline from prior stroke). No gross unsteadiness, decreased gait speed for age.  Stairs            Wheelchair Mobility    Modified Rankin (Stroke Patients Only)       Balance Overall balance assessment: Mild deficits observed, not formally tested                                           Pertinent Vitals/Pain Pain Assessment: No/denies pain    Home Living Family/patient  expects to be discharged to:: Private residence Living Arrangements: Alone Available Help at Discharge: Family;Available PRN/intermittently Type of Home: House Home Access: Ramped entrance     Home Layout: One level Home Equipment: Clinical cytogeneticist - 2 wheels;Walker - 4 wheels;Cane - single point      Prior Function Level of Independence: Independent         Comments: Drives, independent ADL's/IADL's, deniest history of falls     Hand Dominance        Extremity/Trunk Assessment   Upper Extremity Assessment Upper Extremity Assessment: Overall WFL for tasks assessed    Lower Extremity Assessment Lower Extremity Assessment: RLE deficits/detail;LLE deficits/detail RLE Deficits / Details: WFL LLE Deficits / Details: Residual deficits from prior stroke       Communication   Communication: No difficulties  Cognition Arousal/Alertness: Awake/alert Behavior During Therapy: WFL for tasks assessed/performed Overall Cognitive Status: Within Functional Limits for tasks assessed                                        General Comments      Exercises     Assessment/Plan    PT Assessment Patient needs continued PT services  PT Problem List Decreased strength;Decreased balance;Decreased mobility       PT  Treatment Interventions Gait training;Functional mobility training;Therapeutic activities;Therapeutic exercise;Balance training;Patient/family education    PT Goals (Current goals can be found in the Care Plan section)  Acute Rehab PT Goals Patient Stated Goal: none stated; agreeable to therapy  PT Goal Formulation: With patient Time For Goal Achievement: 01/07/20 Potential to Achieve Goals: Good    Frequency Min 3X/week   Barriers to discharge        Co-evaluation               AM-PAC PT "6 Clicks" Mobility  Outcome Measure Help needed turning from your back to your side while in a flat bed without using bedrails?: None Help needed  moving from lying on your back to sitting on the side of a flat bed without using bedrails?: None Help needed moving to and from a bed to a chair (including a wheelchair)?: None Help needed standing up from a chair using your arms (e.g., wheelchair or bedside chair)?: None Help needed to walk in hospital room?: None Help needed climbing 3-5 steps with a railing? : A Little 6 Click Score: 23    End of Session   Activity Tolerance: Patient tolerated treatment well Patient left: in bed;with call bell/phone within reach;with bed alarm set Nurse Communication: Mobility status PT Visit Diagnosis: Unsteadiness on feet (R26.81);Difficulty in walking, not elsewhere classified (R26.2)    Time: [id number] PT Time Calculation (min) (ACUTE ONLY): 17 min   Charges:   PT Evaluation $PT Eval Moderate Complexity: 1 Mod          Ellamae Sia, [place], DPT Acute Rehabilitation Services Pager [phone] Office [phone]   Willy Eddy 12/24/2019, 4:03 PM

## 2019-12-24 NOTE — Procedures (Signed)
Patient Name: Russell Hebert  MRN: [id number]  Epilepsy Attending: Lora Havens  Referring Physician/Provider: Dr. Eleonore Chiquito Date: 12/24/2019 Duration: 23.49 minutes  Patient history: 74 year old male who presented with syncope versus seizure-like episode.  EEG to evaluate for seizures.  Level of alertness: Awake  AEDs during EEG study: None  Technical aspects: This EEG study was done with scalp electrodes positioned according to the 10-20 International system of electrode placement. Electrical activity was acquired at a sampling rate of 500Hz  and reviewed with a high frequency filter of 70Hz  and a low frequency filter of 1Hz . EEG data were recorded continuously and digitally stored.   Description: The posterior dominant rhythm consists of 8Hz  activity of moderate voltage (25-35 uV) seen predominantly in posterior head regions, symmetric and reactive to eye opening and eye closing.  EEG showed intermittent generalized, maximal bitemporal 2 to 3 Hz delta slowing.  Physiologic photic driving was seen during photic stimulation.  Hyperventilation was not performed.          Abnormality -Intermittent slow, generalized, maximal bitemporal  IMPRESSION: This study is suggestive of nonspecific bitemporal cortical dysfunction as well as mild diffuse encephalopathy, nonspecific to etiology. No seizures or epileptiform discharges were seen throughout the recording.  Reganne Messerschmidt Barbra Sarks

## 2019-12-25 ENCOUNTER — Ambulatory Visit: Payer: Medicare HMO | Attending: Internal Medicine

## 2019-12-25 DIAGNOSIS — Z23 Encounter for immunization: Secondary | ICD-10-CM | POA: Insufficient documentation

## 2019-12-25 LAB — CBC
HCT: 35.7 % — ABNORMAL LOW (ref 39.0–52.0)
Hemoglobin: 11.8 g/dL — ABNORMAL LOW (ref 13.0–17.0)
MCH: 31.1 pg (ref 26.0–34.0)
MCHC: 33.1 g/dL (ref 30.0–36.0)
MCV: 93.9 fL (ref 80.0–100.0)
Platelets: 196 10*3/uL (ref 150–400)
RBC: 3.8 MIL/uL — ABNORMAL LOW (ref 4.22–5.81)
RDW: 13.6 % (ref 11.5–15.5)
WBC: 4.8 10*3/uL (ref 4.0–10.5)
nRBC: 0 % (ref 0.0–0.2)

## 2019-12-25 LAB — BASIC METABOLIC PANEL
Anion gap: 9 (ref 5–15)
BUN: 16 mg/dL (ref 8–23)
CO2: 22 mmol/L (ref 22–32)
Calcium: 8.4 mg/dL — ABNORMAL LOW (ref 8.9–10.3)
Chloride: 109 mmol/L (ref 98–111)
Creatinine, Ser: 0.77 mg/dL (ref 0.61–1.24)
GFR calc Af Amer: >60 mL/min (ref >60)
GFR calc non Af Amer: >60 mL/min (ref >60)
Glucose, Bld: 82 mg/dL (ref 70–99)
Potassium: 3.4 mmol/L — ABNORMAL LOW (ref 3.5–5.1)
Sodium: 140 mmol/L (ref 135–145)

## 2019-12-25 MED ORDER — POTASSIUM CHLORIDE CRYS ER 20 MEQ PO TBCR
40.0000 meq | EXTENDED_RELEASE_TABLET | Freq: Once | ORAL | Status: AC
  Administered 2019-12-25: 40 meq via ORAL
  Filled 2019-12-25: qty 2

## 2019-12-25 NOTE — Progress Notes (Signed)
   Covid-19 Vaccination Clinic  Name:  Russell Hebert    MRN: [id number] DOB: 1946/05/22  12/25/2019  [name] was observed post Covid-19 immunization for 30 minutes based on pre-vaccination screening without incidence. He was provided with Vaccine Information Sheet and instruction to access the V-Safe system.   [name] was instructed to call 911 with any severe reactions post vaccine: [name] Difficulty breathing  . Swelling of your face and throat  . A fast heartbeat  . A bad rash all over your body  . Dizziness and weakness    Immunizations Administered    Name Date Dose VIS Date Route   Pfizer COVID-19 Vaccine 12/25/2019  5:46 PM 0.3 mL 11/13/2019 Intramuscular   Manufacturer: [place]   Lot: [id number]   [place]: [id number]

## 2019-12-25 NOTE — Discharge Summary (Signed)
Physician Discharge Summary  Russell Hebert [id number] DOB: [birth year] DOA: 12/23/2019  PCP: Alroy Dust, L.Marlou Sa, MD  Admit date: 12/23/2019 Discharge date: 12/25/2019  Time spent: 45 minutes  Recommendations for Outpatient Follow-up:  1. Follow-up with primary care provider 1 to 2 weeks for evaluation of blood pressure and recurrent diarrhea.  Also recommend basic metabolic panel to track kidney function and electrolyte levels   Discharge Diagnoses:  Principal Problem:   Syncope Active Problems:   Coronary artery disease   Hypertension   GERD (gastroesophageal reflux disease)   Discharge Condition: Stable  Diet recommendation: Heart healthy  Filed Weights   12/23/19 1835 12/24/19 0421 12/25/19 0520  Weight: 80.7 kg 79.2 kg 79.9 kg    History of present illness:   Russell Hebert  is a 74 y.o. male, with history of CAD s/p CABG in 2000, hypertension, hyperlipidemia, TIA, GERD came to hospital 12/23/19 after having dizziness and passed out at a local bank.  Patient reported he had been having diarrhea intermittently for 3 weeks, usually 1-2 episodes of loose BM.  He denied taking any antibiotics recently.    He went to visit his daughter who works at a [employer].  He felt dizzy on walking was able to get to the building but passed out while sitting in the chair.  He had loss of consciousness.  Patient denied chest pain.  EMS found him to be hypotensive, treated with IV fluids with improvement. He denied previous history of seizures.Denied bowel or bladder incontinence. Denied biting his tongue. No history of jerking movements of extremities He denie nausea and vomiting.  Work-up in the emergency department revealed acute kidney injury, hypotension and QTC prolongation  Hospital Course:  #1. Syncope vs seizure. 3 week hx diarrhea in setting of decreased oral intake and anti-hypertensive meds. Not orthostatic but BP was on low end of normal. EEG results nonspecific bitemporal cortical  dysfunction as well as mild diffuse encephalopathy nonspecific to etiology, no seizures or epileptiform discharges. Echo with an EF of 60% and normal left ventricle function, grade 1 diastolic dysfunction.  He was provided with IV fluids and antihypertensive medications were held initially.  Stool was negative for C. difficile.  He had no further diarrhea episodes so GI pathogen panel not obtained.  At discharge his blood pressure is 138/87 with a heart rate of 72.  He is afebrile and nontoxic-appearing.  He was evaluated by physical therapy who recommended no further therapy needed.  Recommend he follow-up with his primary care provider in 1 to 2 weeks for evaluation of intermittent diarrhea and lab work to evaluate kidney function.  #2.  Acute kidney injury.  Creatinine 1.49 on admission.  Creatinine 0.77 on day of discharge.  He received continuous IV fluids and nephrotoxins were held.  We will resume home medications at discharge.  Follow-up with primary care provider 1 to 2 weeks as noted above.  #3.  Prolonged QT.  History of same however has lengthened.  No events on tele.   #4.  Diarrhea.  Patient reported a 3-week history of same.  C. difficile negative.  Stool culture negative.  GI pathogen panel was ordered the patient had no further stools during his hospitalization. Denied any recent antibiotic use.  Had loose stool once during his hospitalization.  Follow-up with his primary care provider 1 to 2 weeks as noted above  #5.  Rheumatoid arthritis.  stable at baseline.  Continue home prednisone.  #6.  CAD status post CABG in 2000.  Denied chest pain.  High-sensitivity troponin 5.  EKG with sinus rhythm prolonged QT   Procedures:  Echo  Consultations:    Discharge Exam: Vitals:   12/24/19 2245 12/25/19 0514  BP: (!) 143/84 138/87  Pulse: 69 72  Resp: 18 20  Temp:  98.3 F (36.8 C)  SpO2: 95% 94%    General: Awake alert sitting up in bed eating breakfast no acute  distress Cardiovascular: Regular rate and rhythm no murmur gallop or rub no lower extremity edema pedal pulses are present and palpable Respiratory: No increased work of breathing.  Breath sounds are clear bilaterally I hear no rhonchi no wheeze Neuro: Alert oriented x3 speech clear facial symmetry cranial nerves II through XII grossly intact  Discharge Instructions   Discharge Instructions    Call MD for:  difficulty breathing, headache or visual disturbances   Complete by: As directed    Call MD for:  persistant dizziness or light-headedness   Complete by: As directed    Call MD for:  persistant nausea and vomiting   Complete by: As directed    Call MD for:  severe uncontrolled pain   Complete by: As directed    Call MD for:  temperature >100.4   Complete by: As directed    Diet - low sodium heart healthy   Complete by: As directed    Discharge instructions   Complete by: As directed    Follow up with PCP 1-2 weeks for evaluation of BP control and recurrent diarrhea   Increase activity slowly   Complete by: As directed      Allergies as of 12/25/2019      Reactions   Morphine Anaphylaxis      Medication List    STOP taking these medications   traMADol 50 MG tablet Commonly known as: ULTRAM     TAKE these medications   carvedilol 3.125 MG tablet Commonly known as: COREG Take 3.125 mg by mouth daily.   clopidogrel 75 MG tablet Commonly known as: PLAVIX Take 75 mg by mouth daily.   hydrochlorothiazide 12.5 MG tablet Commonly known as: HYDRODIURIL Take 12.5 mg by mouth daily.   losartan 50 MG tablet Commonly known as: COZAAR Take 50 mg by mouth daily.   methotrexate 2.5 MG tablet Commonly known as: RHEUMATREX Take 5 mg by mouth once a week. Take on Wednesdays per patient   omeprazole 20 MG capsule Commonly known as: PRILOSEC Take 20 mg by mouth daily.   predniSONE 5 MG tablet Commonly known as: DELTASONE Take 5 mg by mouth daily.   simvastatin 40 MG  tablet Commonly known as: ZOCOR Take 40 mg by mouth at bedtime.      Allergies  Allergen Reactions  . Morphine Anaphylaxis      The results of significant diagnostics from this hospitalization (including imaging, microbiology, ancillary and laboratory) are listed below for reference.    Significant Diagnostic Studies: CT Head Wo Contrast  Result Date: 12/23/2019 CLINICAL DATA:  Dizziness.  Hypotension. EXAM: CT HEAD WITHOUT CONTRAST TECHNIQUE: Contiguous axial images were obtained from the base of the skull through the vertex without intravenous contrast. COMPARISON:  10/17/2018 FINDINGS: Brain: Moderate low density in the periventricular white matter likely related to small vessel disease. Right thalamic remote lacunar infarct. No mass lesion, hemorrhage, hydrocephalus, acute infarct, intra-axial, or extra-axial fluid collection. Vascular: Intracranial atherosclerosis. Skull: Normal Sinuses/Orbits: Normal imaged portions of the orbits and globes. Mucosal thickening of the left maxillary sinus. Mild ethmoid air cell mucosal  thickening. Clear mastoid air cells. Other: None. IMPRESSION: 1.  No acute intracranial abnormality. 2. Moderate small vessel ischemic change. 3. Sinus disease Electronically Signed   By: Abigail Miyamoto M.D.   On: 12/23/2019 15:57   EEG adult  Result Date: 12/24/2019 Lora Havens, MD     12/24/2019  9:38 AM Patient Name: Russell Hebert MRN: [id number] Epilepsy Attending: Lora Havens Referring Physician/Provider: Dr. Eleonore Chiquito Date: 12/24/2019 Duration: 23.49 minutes Patient history: 74 year old male who presented with syncope versus seizure-like episode.  EEG to evaluate for seizures. Level of alertness: Awake AEDs during EEG study: None Technical aspects: This EEG study was done with scalp electrodes positioned according to the 10-20 International system of electrode placement. Electrical activity was acquired at a sampling rate of 500Hz  and reviewed with a high  frequency filter of 70Hz  and a low frequency filter of 1Hz . EEG data were recorded continuously and digitally stored. Description: The posterior dominant rhythm consists of 8Hz  activity of moderate voltage (25-35 uV) seen predominantly in posterior head regions, symmetric and reactive to eye opening and eye closing.  EEG showed intermittent generalized, maximal bitemporal 2 to 3 Hz delta slowing.  Physiologic photic driving was seen during photic stimulation.  Hyperventilation was not performed.        Abnormality -Intermittent slow, generalized, maximal bitemporal IMPRESSION: This study is suggestive of nonspecific bitemporal cortical dysfunction as well as mild diffuse encephalopathy, nonspecific to etiology. No seizures or epileptiform discharges were seen throughout the recording. Lora Havens   ECHOCARDIOGRAM COMPLETE  Result Date: 12/24/2019   ECHOCARDIOGRAM REPORT   Patient Name:   Russell Hebert Date of Exam: 12/24/2019 Medical Rec #:  [id number]         Height:       68.0 in Accession #:    [id number]        Weight:       174.5 lb Date of Birth:  1946/09/19          BSA:          1.93 m Patient Age:    67 years          BP:           129/90 mmHg Patient Gender: M                 HR:           75 bpm. Exam Location:  Inpatient Procedure: 2D Echo Indications:    Syncope 780.2 / R55  History:        Patient has prior history of Echocardiogram examinations, most                 recent 06/13/2015. CAD, Prior CABG, TIA; Risk                 Factors:Hypertension and Non-Smoker. GERD.  Sonographer:    Leavy Cella Referring Phys: [place]  1. Left ventricular ejection fraction, by visual estimation, is 60 to 65%. The left ventricle has normal function. There is no left ventricular hypertrophy.  2. Left ventricular diastolic parameters are consistent with Grade I diastolic dysfunction (impaired relaxation).  3. The left ventricle has no regional wall motion abnormalities.  4. Global  right ventricle has normal systolic function.The right ventricular size is normal. No increase in right ventricular wall thickness.  5. Left atrial size was normal.  6. Right atrial size was normal.  7. The mitral valve is normal  in structure. Trivial mitral valve regurgitation. No evidence of mitral stenosis.  8. The tricuspid valve is normal in structure. Tricuspid valve regurgitation is trivial.  9. The aortic valve is tricuspid. Aortic valve regurgitation is trivial. Mild to moderate aortic valve sclerosis/calcification without any evidence of aortic stenosis. 10. The tricuspid regurgitant velocity is 1.94 m/s, and with an assumed right atrial pressure of 3 mmHg, the estimated right ventricular systolic pressure is normal at 18.1 mmHg. 11. The inferior vena cava is normal in size with greater than 50% respiratory variability, suggesting right atrial pressure of 3 mmHg. 12. Trivial pericardial effusion is present. FINDINGS  Left Ventricle: Left ventricular ejection fraction, by visual estimation, is 60 to 65%. The left ventricle has normal function. The left ventricle has no regional wall motion abnormalities. The left ventricular internal cavity size was the left ventricle is normal in size. There is no left ventricular hypertrophy. Left ventricular diastolic parameters are consistent with Grade I diastolic dysfunction (impaired relaxation). Right Ventricle: The right ventricular size is normal. No increase in right ventricular wall thickness. Global RV systolic function is has normal systolic function. The tricuspid regurgitant velocity is 1.94 m/s, and with an assumed right atrial pressure  of 3 mmHg, the estimated right ventricular systolic pressure is normal at 18.1 mmHg. Left Atrium: Left atrial size was normal in size. Right Atrium: Right atrial size was normal in size Pericardium: Trivial pericardial effusion is present. Mitral Valve: The mitral valve is normal in structure. Trivial mitral valve  regurgitation. No evidence of mitral valve stenosis by observation. Tricuspid Valve: The tricuspid valve is normal in structure. Tricuspid valve regurgitation is trivial. Aortic Valve: The aortic valve is tricuspid. Aortic valve regurgitation is trivial. Aortic regurgitation PHT measures 238 msec. Mild to moderate aortic valve sclerosis/calcification is present, without any evidence of aortic stenosis. Pulmonic Valve: The pulmonic valve was normal in structure. Pulmonic valve regurgitation is not visualized. Pulmonic regurgitation is not visualized. Aorta: The aortic root is normal in size and structure. Venous: The inferior vena cava is normal in size with greater than 50% respiratory variability, suggesting right atrial pressure of 3 mmHg. IAS/Shunts: No atrial level shunt detected by color flow Doppler.  LEFT VENTRICLE PLAX 2D LVIDd:         4.27 cm  Diastology LVIDs:         2.42 cm  LV e' lateral:   7.51 cm/s LV PW:         0.98 cm  LV E/e' lateral: 8.9 LV IVS:        1.04 cm  LV e' medial:    6.20 cm/s LVOT diam:     2.10 cm  LV E/e' medial:  10.8 LV SV:         61 ml LV SV Index:   31.14 LVOT Area:     3.46 cm  RIGHT VENTRICLE RV S prime:     9.46 cm/s TAPSE (M-mode): 1.3 cm LEFT ATRIUM             Index       RIGHT ATRIUM           Index LA diam:        2.00 cm 1.04 cm/m  RA Area:     11.40 cm LA Vol (A2C):   46.4 ml 24.06 ml/m RA Volume:   21.70 ml  11.25 ml/m LA Vol (A4C):   56.2 ml 29.14 ml/m LA Biplane Vol: 51.6 ml 26.75 ml/m  AORTIC VALVE AI  PHT:      238 msec  AORTA Ao Root diam: 3.40 cm MITRAL VALVE                        TRICUSPID VALVE MV Area (PHT): 3.08 cm             TR Peak grad:   15.1 mmHg MV PHT:        71.34 msec           TR Vmax:        201.00 cm/s MV Decel Time: 246 msec MV E velocity: 66.70 cm/s 103 cm/s  SHUNTS MV A velocity: 78.20 cm/s 70.3 cm/s Systemic Diam: 2.10 cm MV E/A ratio:  0.85       1.5  Loralie Champagne MD Electronically signed by Loralie Champagne MD Signature Date/Time:  12/24/2019/4:06:52 PM    Final     Microbiology: Recent Results (from the past 240 hour(s))  Stool culture (children & immunocomp patients)     Status: None (Preliminary result)   Collection Time: 12/23/19  2:58 PM   Specimen: Stool  Result Value Ref Range Status   Salmonella/Shigella Screen PENDING  Incomplete   Campylobacter Culture PENDING  Incomplete   E coli, Shiga toxin Assay Negative Negative Final    Comment: (NOTE) Performed At: [facility] [address] [place], [place] [id number] Rush Farmer MD [id number]   C difficile quick scan w PCR reflex     Status: None   Collection Time: 12/23/19  3:00 PM   Specimen: STOOL  Result Value Ref Range Status   C Diff antigen NEGATIVE NEGATIVE Final   C Diff toxin NEGATIVE NEGATIVE Final   C Diff interpretation No C. difficile detected.  Final    Comment: Performed at [facility], [place] [address] [place], [place] [address]  SARS CORONAVIRUS 2 (TAT 6-24 HRS) Nasopharyngeal Nasopharyngeal Swab     Status: None   Collection Time: 12/23/19  5:45 PM   Specimen: Nasopharyngeal Swab  Result Value Ref Range Status   SARS Coronavirus 2 NEGATIVE NEGATIVE Final    Comment: (NOTE) SARS-CoV-2 target nucleic acids are NOT DETECTED. The SARS-CoV-2 RNA is generally detectable in upper and lower respiratory specimens during the acute phase of infection. Negative results do not preclude SARS-CoV-2 infection, do not rule out co-infections with other pathogens, and should not be used as the sole basis for treatment or other patient management decisions. Negative results must be combined with clinical observations, patient history, and epidemiological information. The expected result is Negative. Fact Sheet for Patients: SugarRoll.be Fact Sheet for Healthcare Providers: https://www.woods-mathews.com/ This test is not yet approved or cleared by the [place] FDA and  has been  authorized for detection and/or diagnosis of SARS-CoV-2 by FDA under an Emergency Use Authorization (EUA). This EUA will remain  in effect (meaning this test can be used) for the duration of the COVID-19 declaration under Section 56 4(b)(1) of the Act, 21 U.S.C. section 360bbb-3(b)(1), unless the authorization is terminated or revoked sooner. Performed at [facility], [place] [address] [place], [place] [address]      Labs: Basic Metabolic Panel: Recent Labs  Lab 12/23/19 1321 12/23/19 2033 12/24/19 0447 12/25/19 0501  NA 139  --  141 140  K 3.6  --  3.6 3.4*  CL 104  --  110 109  CO2 24  --  20* 22  GLUCOSE 101*  --  88 82  BUN 28*  --  19 16  CREATININE 1.49*  --  1.08 0.77  CALCIUM 8.8*  --  8.2* 8.4*  MG  --  1.8  --   --    Liver Function Tests: Recent Labs  Lab 12/24/19 0447  AST 18  ALT 17  ALKPHOS 33*  BILITOT 0.6  PROT 5.7*  ALBUMIN 2.7*   No results for input(s): LIPASE, AMYLASE in the last 168 hours. No results for input(s): AMMONIA in the last 168 hours. CBC: Recent Labs  Lab 12/23/19 1321 12/24/19 0447 12/25/19 0501  WBC 6.2 4.5 4.8  HGB 12.4* 12.1* 11.8*  HCT 38.0* 36.4* 35.7*  MCV 96.9 94.5 93.9  PLT 206 205 196   Cardiac Enzymes: No results for input(s): CKTOTAL, CKMB, CKMBINDEX, TROPONINI in the last 168 hours. BNP: BNP (last 3 results) No results for input(s): BNP in the last 8760 hours.  ProBNP (last 3 results) No results for input(s): PROBNP in the last 8760 hours.  CBG: Recent Labs  Lab 12/23/19 1327  GLUCAP 88       Signed:  Radene Gunning NP.  [facility] 12/25/2019, 11:18 AM

## 2019-12-27 LAB — STOOL CULTURE REFLEX - RSASHR

## 2019-12-27 LAB — STOOL CULTURE: E coli, Shiga toxin Assay: NEGATIVE

## 2019-12-27 LAB — STOOL CULTURE REFLEX - CMPCXR

## 2020-01-12 DIAGNOSIS — E7800 Pure hypercholesterolemia, unspecified: Secondary | ICD-10-CM | POA: Diagnosis not present

## 2020-01-12 DIAGNOSIS — R197 Diarrhea, unspecified: Secondary | ICD-10-CM | POA: Diagnosis not present

## 2020-01-12 DIAGNOSIS — I10 Essential (primary) hypertension: Secondary | ICD-10-CM | POA: Diagnosis not present

## 2020-01-12 DIAGNOSIS — R42 Dizziness and giddiness: Secondary | ICD-10-CM | POA: Diagnosis not present

## 2020-01-14 DIAGNOSIS — M150 Primary generalized (osteo)arthritis: Secondary | ICD-10-CM | POA: Diagnosis not present

## 2020-01-14 DIAGNOSIS — M353 Polymyalgia rheumatica: Secondary | ICD-10-CM | POA: Diagnosis not present

## 2020-01-14 DIAGNOSIS — Z79899 Other long term (current) drug therapy: Secondary | ICD-10-CM | POA: Diagnosis not present

## 2020-01-14 DIAGNOSIS — M2550 Pain in unspecified joint: Secondary | ICD-10-CM | POA: Diagnosis not present

## 2020-01-14 DIAGNOSIS — R410 Disorientation, unspecified: Secondary | ICD-10-CM | POA: Diagnosis not present

## 2020-01-14 DIAGNOSIS — E663 Overweight: Secondary | ICD-10-CM | POA: Diagnosis not present

## 2020-01-14 DIAGNOSIS — L4050 Arthropathic psoriasis, unspecified: Secondary | ICD-10-CM | POA: Diagnosis not present

## 2020-01-14 DIAGNOSIS — Z6828 Body mass index (BMI) 28.0-28.9, adult: Secondary | ICD-10-CM | POA: Diagnosis not present

## 2020-01-15 ENCOUNTER — Ambulatory Visit: Payer: Medicare HMO | Attending: Internal Medicine

## 2020-01-15 DIAGNOSIS — Z23 Encounter for immunization: Secondary | ICD-10-CM

## 2020-01-15 NOTE — Progress Notes (Signed)
   Covid-19 Vaccination Clinic  Name:  Russell Hebert    MRN: [id number] DOB: Mar 11, 1946  01/15/2020  [name] was observed post Covid-19 immunization for 15 minutes without incidence. He was provided with Vaccine Information Sheet and instruction to access the V-Safe system.   [name] was instructed to call 911 with any severe reactions post vaccine: [name] Difficulty breathing  . Swelling of your face and throat  . A fast heartbeat  . A bad rash all over your body  . Dizziness and weakness    Immunizations Administered    Name Date Dose VIS Date Route   Pfizer COVID-19 Vaccine 01/15/2020  3:15 PM 0.3 mL 11/13/2019 Intramuscular   Manufacturer: [place]   Lot: [id number]   [place]: [id number]

## 2020-02-09 DIAGNOSIS — M150 Primary generalized (osteo)arthritis: Secondary | ICD-10-CM | POA: Diagnosis not present

## 2020-02-09 DIAGNOSIS — M353 Polymyalgia rheumatica: Secondary | ICD-10-CM | POA: Diagnosis not present

## 2020-02-09 DIAGNOSIS — Z79899 Other long term (current) drug therapy: Secondary | ICD-10-CM | POA: Diagnosis not present

## 2020-02-09 DIAGNOSIS — Z6828 Body mass index (BMI) 28.0-28.9, adult: Secondary | ICD-10-CM | POA: Diagnosis not present

## 2020-02-09 DIAGNOSIS — R410 Disorientation, unspecified: Secondary | ICD-10-CM | POA: Diagnosis not present

## 2020-02-09 DIAGNOSIS — L4050 Arthropathic psoriasis, unspecified: Secondary | ICD-10-CM | POA: Diagnosis not present

## 2020-02-09 DIAGNOSIS — E663 Overweight: Secondary | ICD-10-CM | POA: Diagnosis not present

## 2020-02-09 DIAGNOSIS — M2550 Pain in unspecified joint: Secondary | ICD-10-CM | POA: Diagnosis not present

## 2020-03-16 DIAGNOSIS — R200 Anesthesia of skin: Secondary | ICD-10-CM | POA: Diagnosis not present

## 2020-03-16 DIAGNOSIS — M180 Bilateral primary osteoarthritis of first carpometacarpal joints: Secondary | ICD-10-CM | POA: Diagnosis not present

## 2020-03-16 DIAGNOSIS — M79642 Pain in left hand: Secondary | ICD-10-CM | POA: Diagnosis not present

## 2020-03-16 DIAGNOSIS — M79641 Pain in right hand: Secondary | ICD-10-CM | POA: Diagnosis not present

## 2020-03-16 DIAGNOSIS — M542 Cervicalgia: Secondary | ICD-10-CM | POA: Diagnosis not present

## 2020-03-17 DIAGNOSIS — M2550 Pain in unspecified joint: Secondary | ICD-10-CM | POA: Diagnosis not present

## 2020-03-17 DIAGNOSIS — M150 Primary generalized (osteo)arthritis: Secondary | ICD-10-CM | POA: Diagnosis not present

## 2020-03-17 DIAGNOSIS — E663 Overweight: Secondary | ICD-10-CM | POA: Diagnosis not present

## 2020-03-17 DIAGNOSIS — L4050 Arthropathic psoriasis, unspecified: Secondary | ICD-10-CM | POA: Diagnosis not present

## 2020-03-17 DIAGNOSIS — M353 Polymyalgia rheumatica: Secondary | ICD-10-CM | POA: Diagnosis not present

## 2020-03-17 DIAGNOSIS — Z79899 Other long term (current) drug therapy: Secondary | ICD-10-CM | POA: Diagnosis not present

## 2020-03-17 DIAGNOSIS — Z6828 Body mass index (BMI) 28.0-28.9, adult: Secondary | ICD-10-CM | POA: Diagnosis not present

## 2020-03-17 DIAGNOSIS — R410 Disorientation, unspecified: Secondary | ICD-10-CM | POA: Diagnosis not present

## 2020-03-30 DIAGNOSIS — I509 Heart failure, unspecified: Secondary | ICD-10-CM | POA: Diagnosis not present

## 2020-03-30 DIAGNOSIS — R200 Anesthesia of skin: Secondary | ICD-10-CM | POA: Diagnosis not present

## 2020-03-30 DIAGNOSIS — G5603 Carpal tunnel syndrome, bilateral upper limbs: Secondary | ICD-10-CM | POA: Diagnosis not present

## 2020-03-30 DIAGNOSIS — M180 Bilateral primary osteoarthritis of first carpometacarpal joints: Secondary | ICD-10-CM | POA: Diagnosis not present

## 2020-04-07 DIAGNOSIS — M5412 Radiculopathy, cervical region: Secondary | ICD-10-CM | POA: Diagnosis not present

## 2020-04-07 DIAGNOSIS — M542 Cervicalgia: Secondary | ICD-10-CM | POA: Diagnosis not present

## 2020-04-07 DIAGNOSIS — R200 Anesthesia of skin: Secondary | ICD-10-CM | POA: Diagnosis not present

## 2020-04-07 DIAGNOSIS — R29898 Other symptoms and signs involving the musculoskeletal system: Secondary | ICD-10-CM | POA: Diagnosis not present

## 2020-04-13 DIAGNOSIS — Z0000 Encounter for general adult medical examination without abnormal findings: Secondary | ICD-10-CM | POA: Diagnosis not present

## 2020-04-13 DIAGNOSIS — K219 Gastro-esophageal reflux disease without esophagitis: Secondary | ICD-10-CM | POA: Diagnosis not present

## 2020-04-13 DIAGNOSIS — R42 Dizziness and giddiness: Secondary | ICD-10-CM | POA: Diagnosis not present

## 2020-04-13 DIAGNOSIS — G8929 Other chronic pain: Secondary | ICD-10-CM | POA: Diagnosis not present

## 2020-04-13 DIAGNOSIS — E7800 Pure hypercholesterolemia, unspecified: Secondary | ICD-10-CM | POA: Diagnosis not present

## 2020-04-13 DIAGNOSIS — I10 Essential (primary) hypertension: Secondary | ICD-10-CM | POA: Diagnosis not present

## 2020-04-13 DIAGNOSIS — I679 Cerebrovascular disease, unspecified: Secondary | ICD-10-CM | POA: Diagnosis not present

## 2020-04-26 DIAGNOSIS — M542 Cervicalgia: Secondary | ICD-10-CM | POA: Diagnosis not present

## 2020-04-26 DIAGNOSIS — M4802 Spinal stenosis, cervical region: Secondary | ICD-10-CM | POA: Diagnosis not present

## 2020-04-26 DIAGNOSIS — M5412 Radiculopathy, cervical region: Secondary | ICD-10-CM | POA: Diagnosis not present

## 2020-04-27 DIAGNOSIS — M180 Bilateral primary osteoarthritis of first carpometacarpal joints: Secondary | ICD-10-CM | POA: Diagnosis not present

## 2020-04-27 DIAGNOSIS — M25512 Pain in left shoulder: Secondary | ICD-10-CM | POA: Diagnosis not present

## 2020-05-05 DIAGNOSIS — M7542 Impingement syndrome of left shoulder: Secondary | ICD-10-CM | POA: Diagnosis not present

## 2020-05-05 DIAGNOSIS — M19011 Primary osteoarthritis, right shoulder: Secondary | ICD-10-CM | POA: Diagnosis not present

## 2020-05-05 DIAGNOSIS — M19012 Primary osteoarthritis, left shoulder: Secondary | ICD-10-CM | POA: Diagnosis not present

## 2020-05-05 DIAGNOSIS — M4722 Other spondylosis with radiculopathy, cervical region: Secondary | ICD-10-CM | POA: Diagnosis not present

## 2020-05-25 DIAGNOSIS — M180 Bilateral primary osteoarthritis of first carpometacarpal joints: Secondary | ICD-10-CM | POA: Diagnosis not present

## 2020-06-23 DIAGNOSIS — M542 Cervicalgia: Secondary | ICD-10-CM | POA: Diagnosis not present

## 2020-06-23 DIAGNOSIS — M4722 Other spondylosis with radiculopathy, cervical region: Secondary | ICD-10-CM | POA: Diagnosis not present

## 2020-07-13 DIAGNOSIS — L4050 Arthropathic psoriasis, unspecified: Secondary | ICD-10-CM | POA: Diagnosis not present

## 2020-07-13 DIAGNOSIS — Z6827 Body mass index (BMI) 27.0-27.9, adult: Secondary | ICD-10-CM | POA: Diagnosis not present

## 2020-07-13 DIAGNOSIS — E663 Overweight: Secondary | ICD-10-CM | POA: Diagnosis not present

## 2020-07-13 DIAGNOSIS — M353 Polymyalgia rheumatica: Secondary | ICD-10-CM | POA: Diagnosis not present

## 2020-07-13 DIAGNOSIS — M150 Primary generalized (osteo)arthritis: Secondary | ICD-10-CM | POA: Diagnosis not present

## 2020-07-13 DIAGNOSIS — M2550 Pain in unspecified joint: Secondary | ICD-10-CM | POA: Diagnosis not present

## 2020-07-21 DIAGNOSIS — M2550 Pain in unspecified joint: Secondary | ICD-10-CM | POA: Diagnosis not present

## 2020-07-21 DIAGNOSIS — Z79899 Other long term (current) drug therapy: Secondary | ICD-10-CM | POA: Diagnosis not present

## 2020-08-10 DIAGNOSIS — M180 Bilateral primary osteoarthritis of first carpometacarpal joints: Secondary | ICD-10-CM | POA: Diagnosis not present

## 2020-08-10 DIAGNOSIS — G5603 Carpal tunnel syndrome, bilateral upper limbs: Secondary | ICD-10-CM | POA: Diagnosis not present

## 2020-08-21 IMAGING — MR MR LUMBAR SPINE W/O CM
5 series · 45 of 48 positions shown · non-contrast
Comparison: None.

CLINICAL DATA: Status post fall.  Pain for 1.5 months.

EXAM:
MRI LUMBAR SPINE WITHOUT CONTRAST
TECHNIQUE: Multiplanar, multisequence MR imaging of the lumbar spine was
performed. No intravenous contrast was administered.

[Series 3: T2 post-contrast · sagittal · 4.0mm · 0.88mm/px · 6 of 14 slices shown]
[im 1/14]
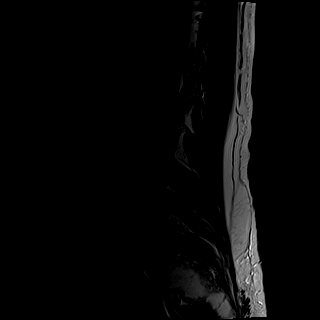
[im 3/14]
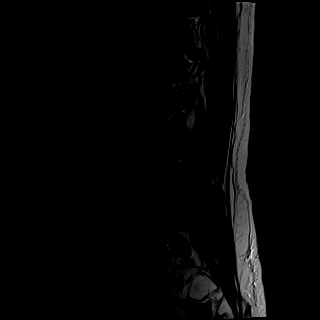
[im 6/14]
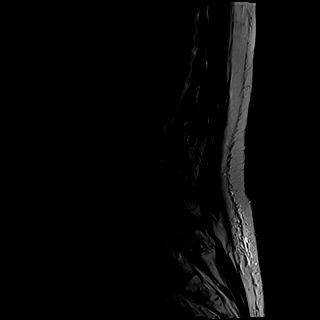
[im 8/14]
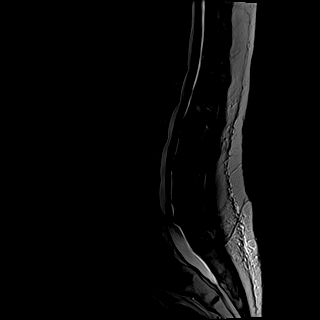
[im 11/14]
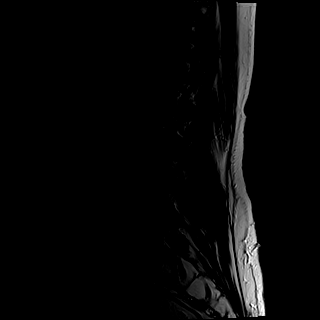
[im 14/14]
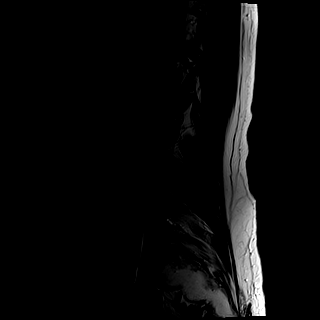

[Series 4: T1 · sagittal · 4.0mm · 0.88mm/px · 5 of 14 slices shown (1 of 2)]
[im 1/14]
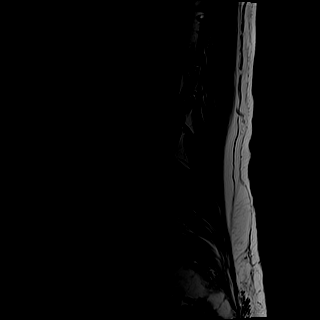
[im 4/14]
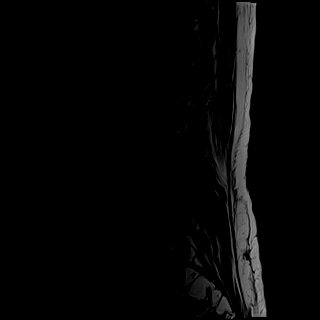
[im 7/14]
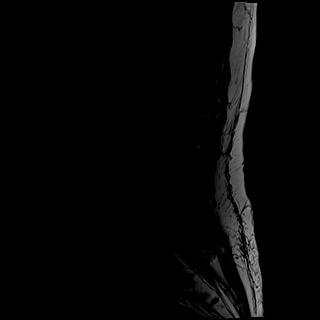
[im 10/14]
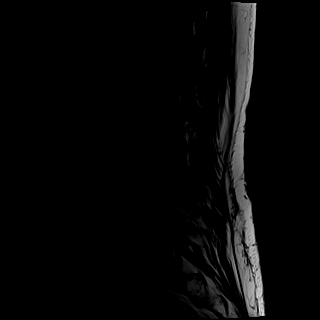
[im 14/14]
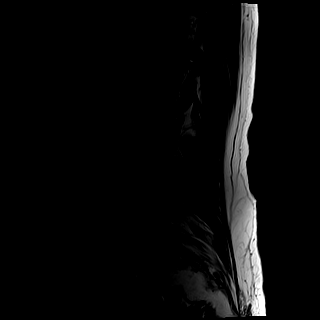

[Series 5: tirm sag · sagittal · 4.0mm · 0.55mm/px · 5 of 14 slices shown]
[im 1/14]
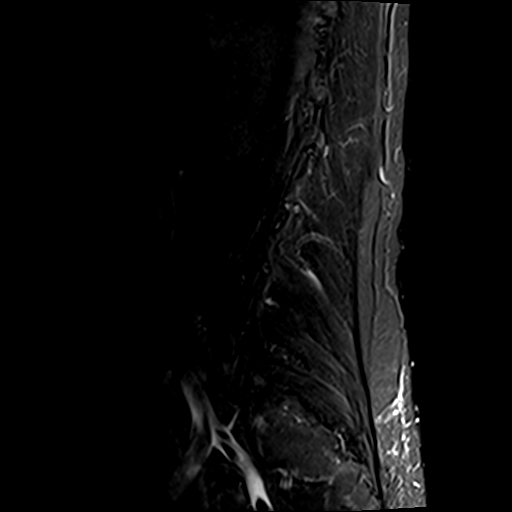
[im 4/14]
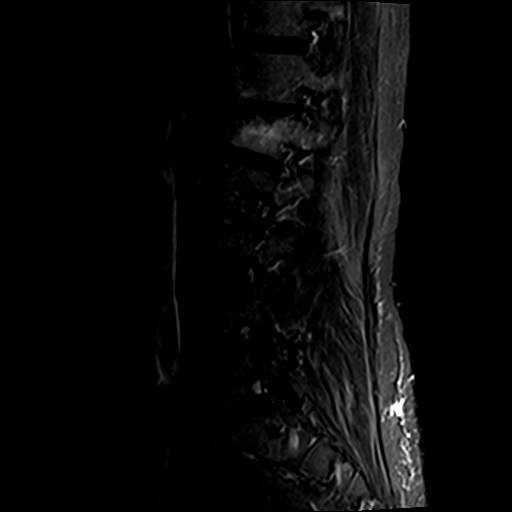
[im 7/14]
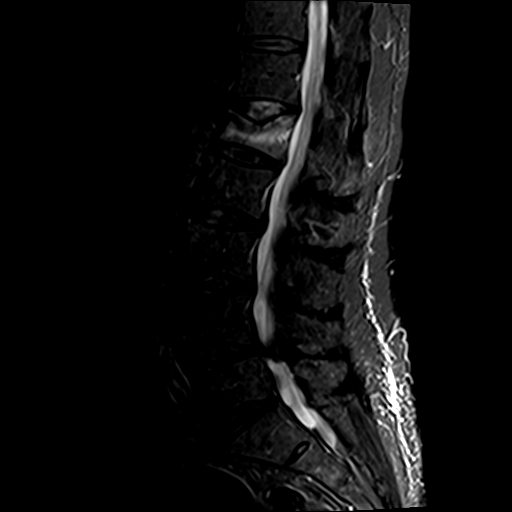
[im 10/14]
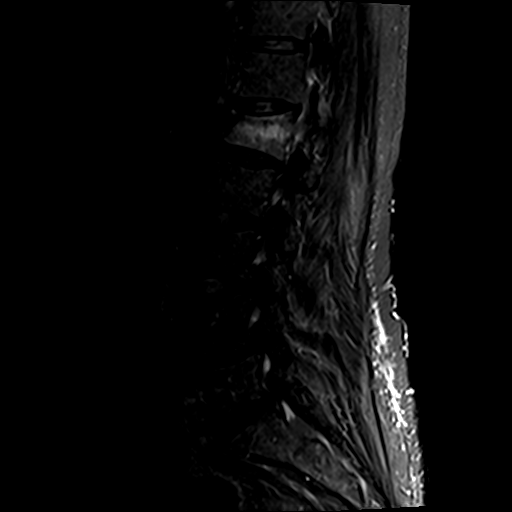
[im 14/14]
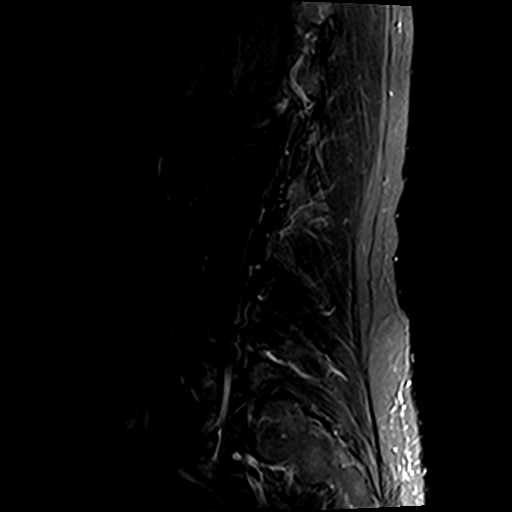

[Series 6: T1 · axial · 4.0mm · 0.78mm/px · z∈[-142,+108]mm · 13 of 43 slices shown (2 of 2)]
[im 1/43]
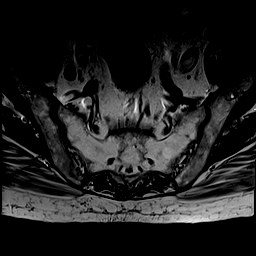
[im 3/43]
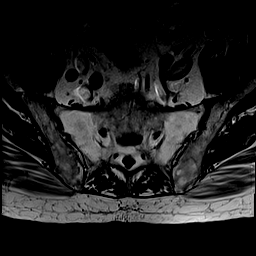
[im 6/43]
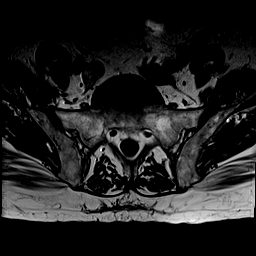
[im 9/43]
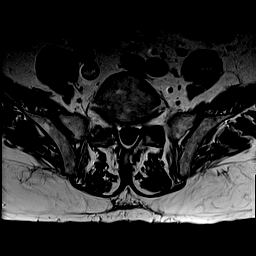
[im 12/43]
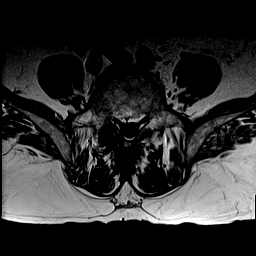
[im 15/43]
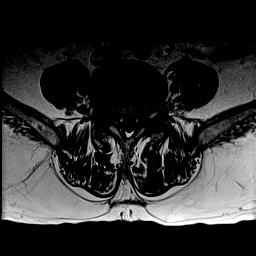
[im 17/43]
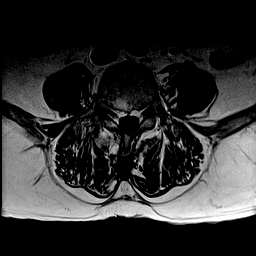
[im 20/43]
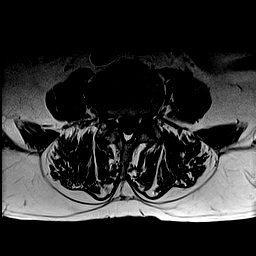
[im 23/43]
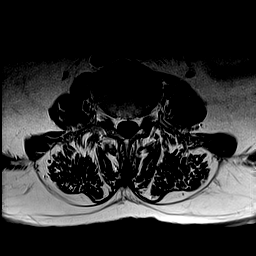
[im 26/43]
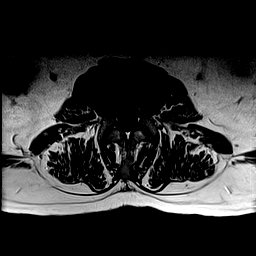
[im 31/43]
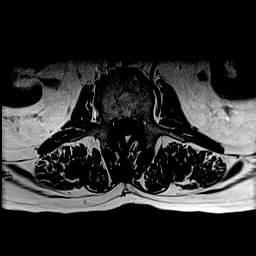
[im 37/43]
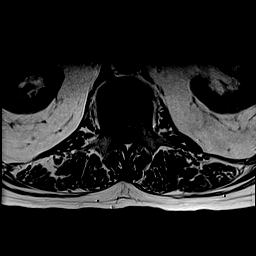
[im 43/43]
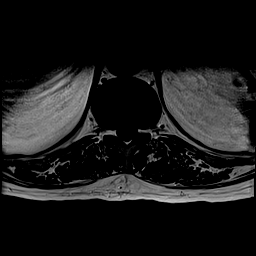

[Series 7: T2 · axial · 4.0mm · 0.78mm/px · z∈[-142,+108]mm · 16 of 43 slices shown]
[im 1/43]
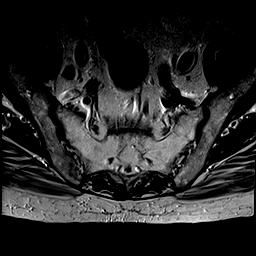
[im 3/43]
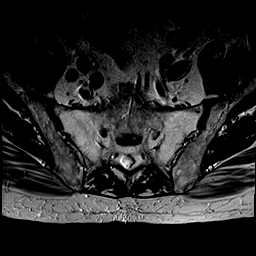
[im 6/43]
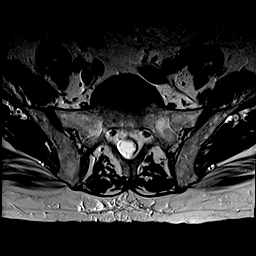
[im 9/43]
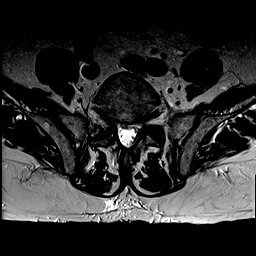
[im 12/43]
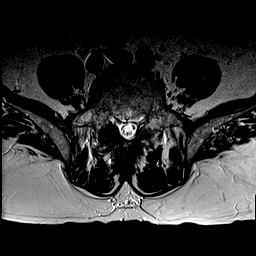
[im 15/43]
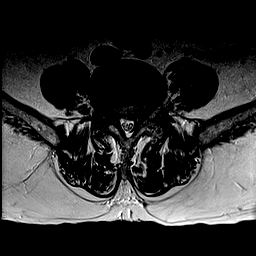
[im 17/43]
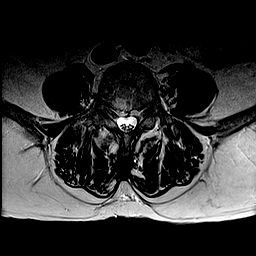
[im 20/43]
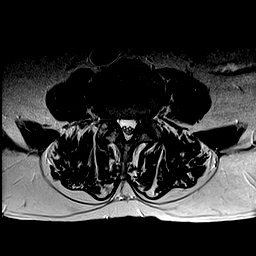
[im 23/43]
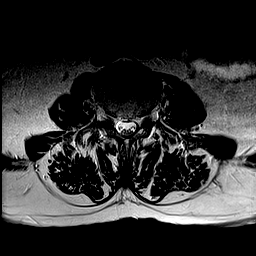
[im 26/43]
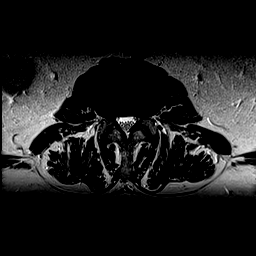
[im 29/43]
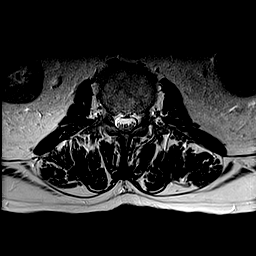
[im 31/43]
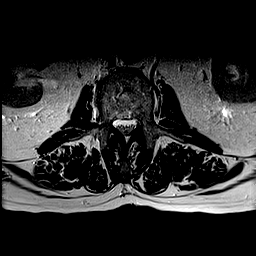
[im 34/43]
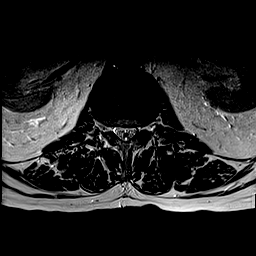
[im 37/43]
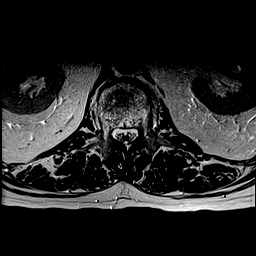
[im 40/43]
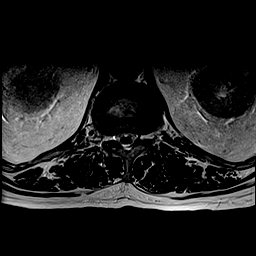
[im 43/43]
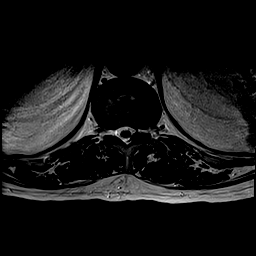

[45 of 48 positions shown; findings below may reference images not displayed]

FINDINGS: Segmentation:  Standard.

Alignment: 2 mm anterolisthesis of L4 on L5 secondary to facet
disease.

Vertebrae: L1 vertebral body compression fracture with 50% height
loss and marrow edema within the vertebral body most severe along
the superior aspect. No retropulsion of the vertebral body.
Remainder the vertebral body heights are maintained. No aggressive
osseous lesion. No discitis or osteomyelitis.

Conus medullaris and cauda equina: Conus extends to the L1 level.
Conus and cauda equina appear normal.

Paraspinal and other soft tissues: No acute paraspinal abnormality.

Disc levels:

Disc spaces: Disc desiccation throughout the lumbar spine. Disc
heights are relatively well maintained.

T12-L1: Minimal broad-based disc bulge. Mild bilateral facet
arthropathy. No evidence of neural foraminal stenosis. No central
canal stenosis.

L1-L2: Mild broad-based disc bulge. Mild bilateral facet
arthropathy. No evidence of neural foraminal stenosis. No central
canal stenosis.

L2-L3: Mild broad-based disc bulge. Mild bilateral facet
arthropathy. No evidence of neural foraminal stenosis. No central
canal stenosis.

L3-L4: Mild broad-based disc bulge. Mild bilateral facet
arthropathy. No evidence of neural foraminal stenosis. No central
canal stenosis.

L4-L5: Mild broad-based disc bulge. Severe bilateral facet
arthropathy with ligamentum flavum infolding. Severe spinal
stenosis. Moderate bilateral foraminal stenosis.

L5-S1: Broad-based disc bulge. Mild bilateral facet arthropathy.
Moderate right foraminal stenosis. No left foraminal stenosis. No
central canal stenosis.
IMPRESSION: 1. Acute-subacute L1 vertebral body compression fracture with
approximately 50% anterior height loss and marrow edema throughout
the vertebral body.
2. At L4-5 there is a mild broad-based disc bulge. Severe bilateral
facet arthropathy with ligamentum flavum infolding. Severe spinal
stenosis. Moderate bilateral foraminal stenosis.

## 2020-09-21 ENCOUNTER — Other Ambulatory Visit (HOSPITAL_COMMUNITY): Payer: Self-pay | Admitting: Internal Medicine

## 2020-09-21 ENCOUNTER — Ambulatory Visit: Payer: Medicare HMO | Attending: Internal Medicine

## 2020-09-21 DIAGNOSIS — Z23 Encounter for immunization: Secondary | ICD-10-CM

## 2020-09-21 NOTE — Progress Notes (Signed)
   Covid-19 Vaccination Clinic  Name:  Russell Hebert    MRN: [id number] DOB: Mar 08, 1946  09/21/2020  Russell Hebert was observed post Covid-19 immunization for 15 minutes without incident. He was provided with Vaccine Information Sheet and instruction to access the V-Safe system.   Russell Hebert was instructed to call 911 with any severe reactions post vaccine: [name] Difficulty breathing  . Swelling of face and throat  . A fast heartbeat  . A bad rash all over body  . Dizziness and weakness

## 2020-10-19 DIAGNOSIS — G8929 Other chronic pain: Secondary | ICD-10-CM | POA: Diagnosis not present

## 2020-10-19 DIAGNOSIS — I10 Essential (primary) hypertension: Secondary | ICD-10-CM | POA: Diagnosis not present

## 2020-10-19 DIAGNOSIS — E7800 Pure hypercholesterolemia, unspecified: Secondary | ICD-10-CM | POA: Diagnosis not present

## 2020-10-19 DIAGNOSIS — I679 Cerebrovascular disease, unspecified: Secondary | ICD-10-CM | POA: Diagnosis not present

## 2020-10-19 DIAGNOSIS — K219 Gastro-esophageal reflux disease without esophagitis: Secondary | ICD-10-CM | POA: Diagnosis not present

## 2020-12-08 ENCOUNTER — Encounter (INDEPENDENT_AMBULATORY_CARE_PROVIDER_SITE_OTHER): Payer: Self-pay

## 2020-12-08 ENCOUNTER — Other Ambulatory Visit: Payer: Self-pay

## 2020-12-08 ENCOUNTER — Encounter: Payer: Self-pay | Admitting: Cardiology

## 2020-12-08 ENCOUNTER — Ambulatory Visit: Payer: Medicare HMO | Admitting: Cardiology

## 2020-12-08 VITALS — BP 120/82 | HR 94 | Ht 68.0 in | Wt 176.0 lb

## 2020-12-08 DIAGNOSIS — M353 Polymyalgia rheumatica: Secondary | ICD-10-CM

## 2020-12-08 DIAGNOSIS — E782 Mixed hyperlipidemia: Secondary | ICD-10-CM | POA: Diagnosis not present

## 2020-12-08 DIAGNOSIS — I2510 Atherosclerotic heart disease of native coronary artery without angina pectoris: Secondary | ICD-10-CM | POA: Diagnosis not present

## 2020-12-08 NOTE — Progress Notes (Signed)
Cardiology Office Note:    Date:  12/08/2020   ID:  Russell Hebert, DOB 04/21/1946, MRN [id number]  PCP:  Asencion Gowda.August Saucer, MD  [facility] Cardiologist:  Donato Schultz, MD  [facility] Electrophysiologist:  None   Referring MD: Clovis Riley, Elbert Ewings.August Saucer, MD    History of Present Illness:    Russell Hebert is a 75 y.o. male here for the follow-up of CAD prior bypass.  CABG in 2000 Right thalamic intracranial hemorrhage in 2004 no recurrent issues Bare-metal stent to circumflex in 2008 with lateral myocardial infarction Catheterization 2009-stent was patent-had 3 total stents placed.  I used to take care of his wife with end-stage renal disease who died in 2018/02/21.  He has a daughter who lives in [place].  Did not tolerate statins in the past Lipitor Crestor.  Was able to tolerate simvastatin.  Dr. Lendon Colonel had diagnosed polymyalgia rheumatica with prednisone use.  He was hospitalized and discharged back on 12/25/2019 where he had an episode of syncope, passed out at a local bank.  He reportedly had been having diarrhea intermittently for 3 weeks.  Loss consciousness.  Echo was normal given IV fluids.  Creatinine was 1.49 on admission 0.77 on discharge.  QT was prolonged however stable.  No events on telemetry.  Troponin was 5.  Normal.  Occasionally at home he will have some confusion.  He has been taking some diazepam for his vertigo.  Also still has vertigo type symptoms or disequilibrium at times.  Blood pressure can run low at times at home his daughter states.  See below.  Past Medical History:  Diagnosis Date  . Carotid artery disease (HCC)    Doppler, December, 2010, 0-39% bilateral  . Coronary artery disease    Acute lateral MI October, 2008, bare-metal stent circumflex /  catheterization November, 2009, LIMA-LAD patent,, all vein grafts patent OM stent patent... some sluggish distal flow... medical therapy  . Ejection fraction   . GERD (gastroesophageal reflux  disease)   . Hx of CABG    2000  . Hypercholesterolemia    Simvastatin started 2009  . Hypertension   . Intracerebral bleed (HCC)    right thalamic, 2004, despite this tolerates aspirin and Plavix  . TIA (transient ischemic attack) 2006    Past Surgical History:  Procedure Laterality Date  . CORONARY ARTERY BYPASS GRAFT  2000   Acute lateral MI and bare-metal stent circumflex..(10/08) Cath 10/14/08; LIMA to the LAD patent ; all vein grafts patent; OM stent patent..some sluggish distal flow.    Current Medications: Current Meds  Medication Sig  . acetaminophen (TYLENOL) 500 MG tablet 2 tablets as needed  . carvedilol (COREG) 3.125 MG tablet Take 3.125 mg by mouth daily.  . celecoxib (CELEBREX) 200 MG capsule Take by mouth.  . clopidogrel (PLAVIX) 75 MG tablet Take 75 mg by mouth daily.  . diazepam (VALIUM) 5 MG tablet 1 tablet  . meclizine (ANTIVERT) 25 MG tablet 1 tablet  . methotrexate (RHEUMATREX) 2.5 MG tablet Take 5 mg by mouth once a week. Take on Wednesdays per patient  . omeprazole (PRILOSEC) 20 MG capsule Take 20 mg by mouth daily.  . predniSONE (DELTASONE) 5 MG tablet Take 5 mg by mouth daily.   . simvastatin (ZOCOR) 40 MG tablet Take 40 mg by mouth at bedtime.  . [DISCONTINUED] hydrochlorothiazide (HYDRODIURIL) 12.5 MG tablet Take 12.5 mg by mouth daily.  . [DISCONTINUED] losartan (COZAAR) 50 MG tablet Take 50 mg by mouth daily.  Allergies:   Morphine and Celebrex [celecoxib]   Social History   Socioeconomic History  . Marital status: Married    Spouse name: Not on file  . Number of children: Not on file  . Years of education: Not on file  . Highest education level: Not on file  Occupational History  . Occupation: retired    Comment: heating and air conditioning  Tobacco Use  . Smoking status: Never Smoker  . Smokeless tobacco: Never Used  Substance and Sexual Activity  . Alcohol use: No  . Drug use: No  . Sexual activity: Not Currently  Other Topics  Concern  . Not on file  Social History Narrative  . Not on file   Social Determinants of Health   Financial Resource Strain: Not on file  Food Insecurity: Not on file  Transportation Needs: Not on file  Physical Activity: Not on file  Stress: Not on file  Social Connections: Not on file     Family History: The patient's family history includes Heart disease in his mother; Heart failure in his father and mother.  ROS:   Please see the history of present illness.     All other systems reviewed and are negative.  EKGs/Labs/Other Studies Reviewed:    The following studies were reviewed today:  ECHO 12/24/19:  1. Left ventricular ejection fraction, by visual estimation, is 60 to  65%. The left ventricle has normal function. There is no left ventricular  hypertrophy.  2. Left ventricular diastolic parameters are consistent with Grade I  diastolic dysfunction (impaired relaxation).  3. The left ventricle has no regional wall motion abnormalities.  4. Global right ventricle has normal systolic function.The right  ventricular size is normal. No increase in right ventricular wall  thickness.  5. Left atrial size was normal.  6. Right atrial size was normal.  7. The mitral valve is normal in structure. Trivial mitral valve  regurgitation. No evidence of mitral stenosis.  8. The tricuspid valve is normal in structure. Tricuspid valve  regurgitation is trivial.  9. The aortic valve is tricuspid. Aortic valve regurgitation is trivial.  Mild to moderate aortic valve sclerosis/calcification without any evidence  of aortic stenosis.  10. The tricuspid regurgitant velocity is 1.94 m/s, and with an assumed  right atrial pressure of 3 mmHg, the estimated right ventricular systolic  pressure is normal at 18.1 mmHg.  11. The inferior vena cava is normal in size with greater than 50%  respiratory variability, suggesting right atrial pressure of 3 mmHg.  12. Trivial pericardial  effusion is present.   EKG:  EKG is  ordered today.  The ekg ordered today demonstrates sinus rhythm 94 nonspecific ST-T wave changes  Recent Labs: 12/23/2019: Magnesium 1.8 12/24/2019: ALT 17 12/25/2019: BUN 16; Creatinine, Ser 0.77; Hemoglobin 11.8; Platelets 196; Potassium 3.4; Sodium 140  Recent Lipid Panel    Component Value Date/Time   CHOL 204 (HH) 01/18/2009 0940   TRIG 109 01/18/2009 0940   HDL 59.1 01/18/2009 0940   CHOLHDL 3.5 CALC 01/18/2009 0940   VLDL 22 01/18/2009 0940   LDLCALC  10/03/2007 0300    92        Total Cholesterol/HDL:CHD Risk Coronary Heart Disease Risk Table                     Men   Women  1/2 Average Risk   3.4   3.3   LDLDIRECT 121.3 01/18/2009 0940     Risk Assessment/Calculations:  Physical Exam:    VS:  BP 120/82   Pulse 94   Ht 5\' 8"  (1.727 m)   Wt 176 lb (79.8 kg)   SpO2 98%   BMI 26.76 kg/m     Wt Readings from Last 3 Encounters:  12/08/20 176 lb (79.8 kg)  12/25/19 176 lb 2.4 oz (79.9 kg)  11/26/19 185 lb 3.2 oz (84 kg)     GEN:  Well nourished, well developed in no acute distress in wheelchair HEENT: Normal NECK: No JVD; No carotid bruits LYMPHATICS: No lymphadenopathy CARDIAC: RRR, no murmurs, rubs, gallops RESPIRATORY:  Clear to auscultation without rales, wheezing or rhonchi  ABDOMEN: Soft, non-tender, non-distended MUSCULOSKELETAL:  No edema; No deformity  SKIN: Warm and dry NEUROLOGIC:  Alert and oriented x 3 PSYCHIATRIC:  Normal affect   ASSESSMENT:    1. Coronary artery disease involving native coronary artery of native heart without angina pectoris   2. Polymyalgia rheumatica ([place])   3. Mixed hyperlipidemia    PLAN:    In order of problems listed above:  Coronary artery disease post CABG with 3 PCI's -Prior lateral MI -On Plavix only.  No aspirin.  Doing well.  No anginal symptoms. -Echocardiogram this past year was unremarkable, normal pump function. - His shortness of breath is likely mostly  related to deconditioning.  Encouraged daily exercises  Hyperlipidemia -Tolerating simvastatin 40 mg.  Did not tolerate Crestor or atorvastatin. -LDL in the past 87, currently recorded at 115  Polymyalgia rheumatica -Prednisone/methotrexate, stable.  Still has arthritis.  Takes Tylenol for this.  History of intracranial hemorrhage thalamic -On Plavix only.  Mild carotid artery disease -Monitoring clinically.  Secondary prevention efforts.  Plavix, simvastatin.    Essential hypertension - Still having periods of dizziness.  Definitely do sound vertiginous in etiology, for instance when looking down and looking back up or turning his head, the room can spin and he gets off balance.  However, he was hospitalized previously and had evidence of dehydration, hypotension.  His daughter does state that occasionally his blood pressures will run low at home.  Because of this, I am going to stop his HCTZ 12.5 mg and stop his losartan 50 mg a day. - He will be seeing Dr. Alroy Dust in February, next month.  If his blood pressure happens to be highly elevated, may consider restarting losartan. -He has also been taking some diazepam for his vertigo.  His daughter states that she was concerned the other day because he did not seem clear.  He thought that she was her daughter.  I wonder if the diazepam could be playing a role.         Medication Adjustments/Labs and Tests Ordered: Current medicines are reviewed at length with the patient today.  Concerns regarding medicines are outlined above.  Orders Placed This Encounter  Procedures  . EKG 12-Lead   No orders of the defined types were placed in this encounter.   Patient Instructions  Medication Instructions:  Please discontinue your HCTZ and Cozaar.  Continue all other medications as listed.  *If you need a refill on your cardiac medications before your next appointment, please call your pharmacy*  Follow-Up: At [facility], you and your  health needs are our priority.  As part of our continuing mission to provide you with exceptional heart care, we have created designated Provider Care Teams.  These Care Teams include your primary Cardiologist (physician) and Advanced Practice Providers (APPs -  Physician Assistants and Nurse Practitioners) who  all work together to provide you with the care you need, when you need it.  We recommend signing up for the patient portal called "MyChart".  Sign up information is provided on this After Visit Summary.  MyChart is used to connect with patients for Virtual Visits (Telemedicine).  Patients are able to view lab/test results, encounter notes, upcoming appointments, etc.  Non-urgent messages can be sent to your provider as well.   To learn more about what you can do with MyChart, go to [email or web address].    Your next appointment:   12 month(s)  The format for your next appointment:   In Person  Provider:   Candee Furbish, MD   Thank you for choosing [facility]!!         Signed, Candee Furbish, MD  12/08/2020 12:22 PM    [place]

## 2020-12-08 NOTE — Patient Instructions (Signed)
Medication Instructions:  Please discontinue your HCTZ and Cozaar.  Continue all other medications as listed.  *If you need a refill on your cardiac medications before your next appointment, please call your pharmacy*  Follow-Up: At [facility], you and your health needs are our priority.  As part of our continuing mission to provide you with exceptional heart care, we have created designated Provider Care Teams.  These Care Teams include your primary Cardiologist (physician) and Advanced Practice Providers (APPs -  Physician Assistants and Nurse Practitioners) who all work together to provide you with the care you need, when you need it.  We recommend signing up for the patient portal called "MyChart".  Sign up information is provided on this After Visit Summary.  MyChart is used to connect with patients for Virtual Visits (Telemedicine).  Patients are able to view lab/test results, encounter notes, upcoming appointments, etc.  Non-urgent messages can be sent to your provider as well.   To learn more about what you can do with MyChart, go to [email or web address].    Your next appointment:   12 month(s)  The format for your next appointment:   In Person  Provider:   Donato Schultz, MD   Thank you for choosing [facility]!!

## 2020-12-22 DIAGNOSIS — R42 Dizziness and giddiness: Secondary | ICD-10-CM | POA: Diagnosis not present

## 2020-12-22 DIAGNOSIS — R251 Tremor, unspecified: Secondary | ICD-10-CM | POA: Diagnosis not present

## 2020-12-22 DIAGNOSIS — G8929 Other chronic pain: Secondary | ICD-10-CM | POA: Diagnosis not present

## 2020-12-23 ENCOUNTER — Encounter: Payer: Self-pay | Admitting: Neurology

## 2021-01-03 NOTE — Progress Notes (Signed)
Assessment/Plan:   1.  Parkinsonism.  I suspect that this does represent idiopathic Parkinson's disease.  The patient has tremor, bradykinesia, rigidity and postural instability.  -Suspect that he has had this diagnosis for quite some time given degree of symptoms.  -We discussed the diagnosis as well as pathophysiology of the disease.  We discussed treatment options as well as prognostic indicators.  Patient education was provided.  -Safety and home discussed.  -We decided to add carbidopa/levodopa 25/100.  1/2 tab tid x 1 wk, then 1/2 in am & noon & 1 at night for a week, then 1/2 in am &1 at noon &night for a week, then 1 po tid.  Risks, benefits, side effects and alternative therapies were discussed.  The opportunity to ask questions was given and they were answered to the best of my ability.  The patient expressed understanding and willingness to follow the outlined treatment protocols.  -I will refer the patient for home physical therapy.  -We discussed community resources in the area including patient support groups and community exercise programs for PD and pt education was provided to the patient.  -Discussed with patient and daughter that his meds need directly monitored.  -Would recommend no driving right now.  -Resources for online support groups and caregiver support were given.  Resources for home health caregivers were given.  2.  Dizziness  -I think that this is multifactorial.  I think it is clear that he has had some vertigo previously and the patient does not disagree.  However, I also think he has probably had some orthostasis.  Dr. Marlou Porch has been stopping his blood pressure medications.  He is still on carvedilol, but his blood pressure is much better now and he was just mildly orthostatic in the office today.  -I strongly recommended that he stop the Valium.  He is falling frequently and already has confusion.    Subjective:   Russell Hebert was seen today in the  movement disorders clinic for neurologic consultation at the request of Russell Hebert, Russell Sa, Russell Hebert.  The consultation is for the evaluation of tremor and dizziness.  Pt with daughter who supplements the history.  Prior records made available to me are reviewed.  Medical records indicate that patient has history of chronic dizziness with superimposed acute intermittent episodes.  Patient's dizziness not controlled with meclizine.  Patient currently on diazepam, 5 mg 3 times per day with some improvement.  Dizziness is described as spinning - "it all spins like a top."  Sx's x 2 years.    Dizziness is worse with movement.  Patient was hospitalized on December 25, 2019 after having syncopal episode, felt secondary to orthostasis due to dehydration, as the patient had been previously having intermittent diarrhea prior to admission.  Dr. Marlou Porch took the patient off of his blood pressure medication on December 08, 2020 (1 year after hospitalization) because of dizziness/low blood pressure.  Patients losartan and hydrochlorothiazide were both discontinued, although Dr. Marlou Porch felt that symptoms were consistent mostly with vertigo.  Daughter states that BP is improved since doing this but still having dizzy spells.  He also expressed some concern about the patient's Valium causing confusion.     Specific Symptoms:  Tremor: Yes.  , L hand but when asked about this in detail he predominately describes shoulder pain.  Daughter states tremor with activity x 2 years.  Started in the L hand but now both Family hx of similar:  No. Voice: weaker Sleep: "I'm  awake all night and I hurt like the devil and I need to go to the bathroom."  Daughter states that he often doesn't make it to the RR  Vivid Dreams:  Yes.    Acting out dreams:  No. Wet Pillows: No. Postural symptoms:  Yes.    Falls?  Yes.  , fell 2 times yesterday.  Often falls with walker forward or to the side but fell backward yesterday.  Had no fx with  falls Bradykinesia symptoms: shuffling gait, slow movements, difficulty getting out of a chair and difficulty regaining balance Loss of smell:  No. Loss of taste:  unsure Urinary Incontinence:  Yes.   at night  Difficulty Swallowing:  No. Handwriting, micrographia: No. Trouble with ADL's:  Yes.   but he is slow (may take 2 hours)  Trouble buttoning clothing: Yes.  , and is slow (and trouble with shoes and socks) Memory changes:  Yes.   (lives alone; manages finances; daughter states that she notes he is starting to get confused with meds and she notes he is forgetting them; he does drive some but not much) Hallucinations:  No.  visual distortions: No. N/V:  No. per pt but daughter states that he did have nausea (daughter states poor nutrition) Lightheaded:  Yes.    Syncope: Yes.   - 2 episodes due to BP dropping - last about a year ago Diplopia:  Yes.     Patient had a CT of the brain on December 23, 2019.  There was moderate small vessel disease.   ALLERGIES:   Allergies  Allergen Reactions  . Morphine Anaphylaxis  . Celebrex [Celecoxib]     diarrhea    CURRENT MEDICATIONS:  Current Outpatient Medications  Medication Instructions  . acetaminophen (TYLENOL 8 HOUR ARTHRITIS PAIN) 1,950 mg, Oral, 2 times daily  . acetaminophen (TYLENOL) 500 MG tablet 2 tablets as needed  . carvedilol (COREG) 6.25 mg, Oral, Daily,    . clopidogrel (PLAVIX) 75 mg, Oral, Daily,    . diazepam (VALIUM) 5 mg, Oral, 2 times daily  . gabapentin (NEURONTIN) 200 mg, Oral, 2 times daily  . meclizine (ANTIVERT) 25 MG tablet 1 tablet  . methotrexate (RHEUMATREX) 5 mg, Oral, Weekly, Take on Wednesdays per patient  . omeprazole (PRILOSEC) 20 mg, Oral, Daily,    . simvastatin (ZOCOR) 40 mg, Oral, Daily at bedtime,    . traMADol (ULTRAM) 50 MG tablet Oral, Every 6 hours PRN    Objective:   VITALS:   Vitals:   01/05/21 1334  Weight: 173 lb (78.5 kg)  Height: 5\' 8"  (1.727 m)   Orthostatic VS for the  past 24 hrs (Last 3 readings):  BP- Lying Pulse- Lying BP- Sitting Pulse- Sitting BP- Standing at 0 minutes Pulse- Standing at 0 minutes  01/05/21 1345 (!) 148/92 83 130/84 91 130/80 91    GEN:  The patient appears stated age and is in NAD. HEENT:  Normocephalic, atraumatic.  The mucous membranes are moist. The superficial temporal arteries are without ropiness or tenderness. CV:  RRR Lungs:  CTAB Neck/HEME:  There are no carotid bruits bilaterally.  Neurological examination:  Orientation: The patient is alert and oriented x3.  Cranial nerves: There is good facial symmetry. There is facial hypomimia.  Extraocular muscles are intact. The visual fields are full to confrontational testing. The speech is fluent and clear. Soft palate rises symmetrically and there is no tongue deviation. Hearing is intact to conversational tone. Sensation: Sensation is intact to light and  pinprick throughout (facial, trunk, extremities). Vibration is intact at the bilateral big toe. There is no extinction with double simultaneous stimulation. There is no sensory dermatomal level identified. Motor: Strength is 5/5 in the bilateral upper and lower extremities.   Shoulder shrug is equal and symmetric.  There is no pronator drift. Deep tendon reflexes: Deep tendon reflexes are 2-/4 at the bilateral biceps, triceps, brachioradialis, patella and absent at the bilateral achilles. Plantar responses are downgoing bilaterally.  Movement examination: Tone: There is nl tone in the bilateral upper extremities.  The tone in the lower extremities is nl.  Abnormal movements: there is LUE rest tremor, mild Coordination:  There is decremation with RAM's, with any form of RAMS, including alternating supination and pronation of the forearm, hand opening and closing, finger taps, heel taps and toe taps, L.   Gait and Station: The patient requires 2 person assist OOC.  He has freezing of the L leg.  He has freezing in the doorway.  He  turns en bloc.   I have reviewed and interpreted the following labs independently   Chemistry      Component Value Date/Time   NA 140 12/25/2019 0501   K 3.4 (L) 12/25/2019 0501   CL 109 12/25/2019 0501   CO2 22 12/25/2019 0501   BUN 16 12/25/2019 0501   CREATININE 0.77 12/25/2019 0501      Component Value Date/Time   CALCIUM 8.4 (L) 12/25/2019 0501   ALKPHOS 33 (L) 12/24/2019 0447   AST 18 12/24/2019 0447   ALT 17 12/24/2019 0447   BILITOT 0.6 12/24/2019 0447      No results found for: TSH Lab Results  Component Value Date   WBC 4.8 12/25/2019   HGB 11.8 (L) 12/25/2019   HCT 35.7 (L) 12/25/2019   MCV 93.9 12/25/2019   PLT 196 12/25/2019     Total time spent on today's visit was 60 minutes, including both face-to-face time and nonface-to-face time.  Time included that spent on review of records (prior notes available to me/labs/imaging if pertinent), discussing treatment and goals, answering patient's questions and coordinating care.  Cc:  Russell Hebert, Russell Sa, Russell Hebert

## 2021-01-05 ENCOUNTER — Encounter: Payer: Self-pay | Admitting: Neurology

## 2021-01-05 ENCOUNTER — Other Ambulatory Visit: Payer: Self-pay

## 2021-01-05 ENCOUNTER — Ambulatory Visit: Payer: Medicare HMO | Admitting: Neurology

## 2021-01-05 VITALS — Ht 68.0 in | Wt 173.0 lb

## 2021-01-05 DIAGNOSIS — G20 Parkinson's disease: Secondary | ICD-10-CM

## 2021-01-05 DIAGNOSIS — R42 Dizziness and giddiness: Secondary | ICD-10-CM

## 2021-01-05 MED ORDER — CARBIDOPA-LEVODOPA 25-100 MG PO TABS: 1.0000 | ORAL_TABLET | Freq: Three times a day (TID) | ORAL | 1 refills | Status: DC

## 2021-01-05 NOTE — Patient Instructions (Signed)
1.  Start Carbidopa Levodopa as follows:  Take 1/2 tablet three times daily, at least 30 minutes before meals (approximately 7am/11am/4pm), for one week  Then take 1/2 tablet in the morning, 1/2 tablet in the afternoon, 1 tablet in the evening, at least 30 minutes before meals, for one week  Then take 1/2 tablet in the morning, 1 tablet in the afternoon, 1 tablet in the evening, at least 30 minutes before meals, for one week  Then take 1 tablet three times daily at 7am/11am/4pm, at least 30 minutes before meals   As a reminder, carbidopa/levodopa can be taken at the same time as a carbohydrate, but we like to have you take your pill either 30 minutes before a protein source or 1 hour after as protein can interfere with carbidopa/levodopa absorption.  2.  Stop valium/diazepam  3.  We will send physical therapy to the home.    4.  Your medications need to be directly monitored.    Online Resources for Power over Parkinson's Group January 2022  . Local [place] Online Groups  o Power over [employer] :   - Power Over Parkinson's Patient Education Group will be Wednesday, January 12th at 2pm via [place].   - Upcoming Power over Parkinson's Meetings:  2nd Wednesdays of the month at 2 pm:       February 9th, March 9th - Contact Amy Marriott at [email or web address] if interested in participating in this online group o Parkinson's Care Partners Group:    3rd Mondays, Contact Corwin Levins o Atypical Parkinsonian Patient Group:   4th Wednesdays, Contact Corwin Levins o If you are interested in participating in these online groups with Judson Roch, please contact her directly for how to join those meetings.  Her contact information is [email or web address].  She will send you a link to join the [employer].  (Please note that Corwin Levins , MSW, LCSW, has resigned her position at [facility], but will continue to lead the online groups temporarily)  . [place]:   [email or web address] o PD Health at Home continues:  Mindfulness Mondays, Expert Briefing Tuesdays, Wellness Wednesdays, Take Time Thursdays, Fitness Fridays -Listings for January 2022 are on the website o Upcoming Webinar:  Sights, Sounds, and Parkinson's.  Wednesday, January 04, 2021, @ 1 pm  o Please check out their website to sign up for emails and see their full online offerings  . [place]:  [email or web address]  o Upcoming Webinar:   Diet, Exercise, and other Strategies for Living Well as you Age.  Thursday, December 24, 2020 @ 12 noon o Check out additional information on their website to see their full online offerings  . [place]:  [email or web address] o Upcoming Webinar:  Emerging Therapies in Parkinson's with Dr. Andy Gauss.  Wednesday, December 28, 2020 @ 2 pm o Care Partner Monthly Meetup.  With Robin Searing Phinney.  First Tuesday of each month, 2 pm o Check out additional information to Live Well Today on their website  . Parkinson and Movement Disorders (PMD) Alliance:  www.pmdalliance.org o [employer] Online:  Online Education Events o Sign up for emails, which are sent weekly to give you updates on programming and online offerings  . Parkinson's Association of the [place]:  www.parkinsonassociation.org o Information on online support groups, online exercises including Yoga, Parkinson's exercises and more-LOTS of information on links to PD resources and online events o Virtual Support Group through Parkinson's Association of the [place]; next one is scheduled for Wednesday, January 04, 2021  at 2 pm. (These are typically scheduled for the 1st Wednesday of the month at 2 pm).  Visit website for details.  . Additional links for movement activities: o PWR! Moves Classes at [place] RESUMED (but are ON HOLD DUE TO COVID in January)  Contact Amy Newcastle, PT [email or web address] or [phone] if  interested o Here is a link to the PWR!Moves classes on Zoom from [place] - Daily Mon-Sat at 10:00. Via Zoom, FREE and open to all.  There is also a link below via Facebook if you use that platform. - [email or web address].si - [email or web address].no o Parkinson's Wellness Recovery (PWR! Moves)  www.pwr4life.org - Info on the PWR! Virtual Experience:  You will have access to our expertise through self-assessment, guided plans that start with the PD-specific fundamentals, educational content, tips, Q&A with an expert, and a growing Art therapist of PD-specific pre-recorded and live exercise classes of varying types and intensity - both physical and cognitive! If that is not enough, we offer 1:1 wellness consultations (in-person or virtual) to personalize your PWR! Research scientist (medical).  - Check out the PWR! Move of the month on the [place] [facility] website:  [email or web address] o [employer] Fridays:  - As part of the PD Health @ Home program, this free video series focuses each week on one aspect of fitness designed to support people living with Parkinson's.  -  [email or web address].dk o Dance for PD website is offering free, live-stream classes throughout the week, as well as links to [employer] of classes:  [email or web address] o Dance for Parkinson's Class:  [place].  Free offering for people with Parkinson's and care partners; virtual class.  o For more information, contact [phone] or email Ruffin Frederick at [email or web address] o Virtual dance and Pilates for Parkinson's classes: Click on the Community Tab> Parkinson's Movement Initiative Tab.   To register for classes and for more information, visit [email or web address] and click the "community" tab.  o [employer] Parkinson's Cycling Classes  - [facility]: 1pm on Fridays-Live classes at [facility] [employer] at [email or web address] or ([phone]) [facility] [employer]: Virtual Classes Mondays and Thursdays (contact [place] at [email or web address] or [phone])   o [employer] - Three levels of classes are offered Tuesdays and Thursdays:  10:30 am,  12 noon & 1:45 pm at [facility]. To observe a class or for  more information, call [phone] or email [email or web address] - PD Flow Yoga for Parkinson's:  Fridays 1-2 pm, January 7-January 27, 2021 . Well[employer]Spring [employer]: o Chief Technology Officer Opportunities:  www.well-springsolutions.org/caregiver-education/caregiver-support-group.  You may also contact Vickki Muff at [email or web address] or [phone].   o [facility] Powerful Tools for Caregivers, 6 week series designed to provide family and caregivers with practical tools to care for themselves and loved ones.  Wednesdays 10:15-12:15, beginning January 12th - Contact January 25 (above) for details o Well-Spring Navigator:  Just1Navigator program, a free service to help individuals and families through the journey of determining care for older adults.  The "Navigator" is a Vickki Muff, Education officer, museum, who will speak with a prospective client and/or loved ones to provide an assessment of the situation and a set of recommendations for a personalized care plan - all free of charge, and whether Well[facility]Spring [employer] offers the needed service or not. If the need is not a service we provide, we are well-connected with reputable programs in town that we can refer you to.  www.well-springsolutions.org or to speak with the Navigator, call [phone].

## 2021-01-06 ENCOUNTER — Telehealth: Payer: Self-pay

## 2021-01-06 NOTE — Addendum Note (Signed)
Addended by: Ulice Brilliant T on: 01/06/2021 09:24 AM   Modules accepted: Orders

## 2021-01-06 NOTE — Telephone Encounter (Signed)
Upon patient leaving yesterday he requested a handicap placard.   Spoke with Dr Tat and she stated the patient can have the placard only if he does not drive. She states the patient does not need to be driving.   Contacted patients daughter and made her aware that Dr Tat does not want the patient to be driving. Daughter states the patient has trouble hearing on his phone due to him being hard of hearing. Advised daughter that Dr Tat will sign the placard only if the patient is not driving.  Daughter voiced understanding.   Placard placed in the mail.

## 2021-01-10 ENCOUNTER — Telehealth: Payer: Self-pay | Admitting: Neurology

## 2021-01-10 MED ORDER — CARBIDOPA-LEVODOPA 25-100 MG PO TABS: 1.0000 | ORAL_TABLET | Freq: Three times a day (TID) | ORAL | 0 refills | Status: DC

## 2021-01-10 NOTE — Telephone Encounter (Signed)
Patient's daughter called in and stated the prescription that was sent to [facility] won't be here until next week. She wanted to see if a prescription of the Carbidopa-levodopa could be [facility] [address] for about a weeks worth until they get the shipment from [facility]?

## 2021-01-10 NOTE — Telephone Encounter (Signed)
Spoke with patients daughter who states her dad's medication will not be delivered from [facility] to their home until Saturday. She requested a seven day supply [facility] so her dad can start the medication before she goes out of town this weekend.   Rx(s) sent to pharmacy electronically.  She voiced understanding.

## 2021-01-18 ENCOUNTER — Telehealth: Payer: Self-pay

## 2021-01-18 NOTE — Telephone Encounter (Signed)
Received voicemail from patients daughter wanting to know the status of the patients physical therapy referral.   Advised her that I would speak with Dr Tat and once we find someone I will give her a call back.   She voiced understanding.

## 2021-01-23 NOTE — Telephone Encounter (Addendum)
Spoke with patients daughter and informed her that we are working on finding her dad therapy. Explained that the last company we referred the patient to accepted him but did not have any physical therapist to come to their area.   And that we are working on finding the patient therapy. She voiced understanding.

## 2021-01-26 ENCOUNTER — Telehealth: Payer: Self-pay | Admitting: Neurology

## 2021-01-26 NOTE — Telephone Encounter (Signed)
Left a message for Russell Hebert at [facility]. She was seeking verbal orders on patient.

## 2021-01-27 DIAGNOSIS — E7800 Pure hypercholesterolemia, unspecified: Secondary | ICD-10-CM | POA: Diagnosis not present

## 2021-01-27 DIAGNOSIS — I10 Essential (primary) hypertension: Secondary | ICD-10-CM | POA: Diagnosis not present

## 2021-01-27 DIAGNOSIS — I2510 Atherosclerotic heart disease of native coronary artery without angina pectoris: Secondary | ICD-10-CM | POA: Diagnosis not present

## 2021-01-27 DIAGNOSIS — G20 Parkinson's disease: Secondary | ICD-10-CM | POA: Diagnosis not present

## 2021-01-27 DIAGNOSIS — K219 Gastro-esophageal reflux disease without esophagitis: Secondary | ICD-10-CM | POA: Diagnosis not present

## 2021-01-27 DIAGNOSIS — Z951 Presence of aortocoronary bypass graft: Secondary | ICD-10-CM | POA: Diagnosis not present

## 2021-01-27 DIAGNOSIS — Z7902 Long term (current) use of antithrombotics/antiplatelets: Secondary | ICD-10-CM | POA: Diagnosis not present

## 2021-01-27 DIAGNOSIS — I779 Disorder of arteries and arterioles, unspecified: Secondary | ICD-10-CM | POA: Diagnosis not present

## 2021-01-27 DIAGNOSIS — M353 Polymyalgia rheumatica: Secondary | ICD-10-CM | POA: Diagnosis not present

## 2021-01-30 ENCOUNTER — Telehealth: Payer: Self-pay | Admitting: Neurology

## 2021-01-30 NOTE — Telephone Encounter (Signed)
Maria from [facility] called to request verbal orders for patient. She is requesting home health PT for: 1x/week for 1 week; 2x/wk for 4 weeks; and 1x/week for 4 weeks. She is also requesting an order for OT to eval & treat for ADL training.

## 2021-01-31 NOTE — Telephone Encounter (Signed)
ok 

## 2021-02-01 NOTE — Telephone Encounter (Signed)
Received call from [facility] a physical therapist at [facility] calling to get verbal orders for the patient. Advised her that Dr Tat is agreeable.   She voiced understanding.

## 2021-02-09 DIAGNOSIS — G20 Parkinson's disease: Secondary | ICD-10-CM | POA: Diagnosis not present

## 2021-02-09 DIAGNOSIS — Z7902 Long term (current) use of antithrombotics/antiplatelets: Secondary | ICD-10-CM | POA: Diagnosis not present

## 2021-02-09 DIAGNOSIS — I2510 Atherosclerotic heart disease of native coronary artery without angina pectoris: Secondary | ICD-10-CM | POA: Diagnosis not present

## 2021-02-09 DIAGNOSIS — K219 Gastro-esophageal reflux disease without esophagitis: Secondary | ICD-10-CM | POA: Diagnosis not present

## 2021-02-09 DIAGNOSIS — Z951 Presence of aortocoronary bypass graft: Secondary | ICD-10-CM | POA: Diagnosis not present

## 2021-02-09 DIAGNOSIS — M353 Polymyalgia rheumatica: Secondary | ICD-10-CM | POA: Diagnosis not present

## 2021-02-09 DIAGNOSIS — I10 Essential (primary) hypertension: Secondary | ICD-10-CM | POA: Diagnosis not present

## 2021-02-09 DIAGNOSIS — I779 Disorder of arteries and arterioles, unspecified: Secondary | ICD-10-CM | POA: Diagnosis not present

## 2021-02-09 DIAGNOSIS — E7800 Pure hypercholesterolemia, unspecified: Secondary | ICD-10-CM | POA: Diagnosis not present

## 2021-02-10 DIAGNOSIS — I2510 Atherosclerotic heart disease of native coronary artery without angina pectoris: Secondary | ICD-10-CM | POA: Diagnosis not present

## 2021-02-10 DIAGNOSIS — I779 Disorder of arteries and arterioles, unspecified: Secondary | ICD-10-CM | POA: Diagnosis not present

## 2021-02-10 DIAGNOSIS — K219 Gastro-esophageal reflux disease without esophagitis: Secondary | ICD-10-CM | POA: Diagnosis not present

## 2021-02-10 DIAGNOSIS — E7800 Pure hypercholesterolemia, unspecified: Secondary | ICD-10-CM | POA: Diagnosis not present

## 2021-02-10 DIAGNOSIS — Z7902 Long term (current) use of antithrombotics/antiplatelets: Secondary | ICD-10-CM | POA: Diagnosis not present

## 2021-02-10 DIAGNOSIS — M353 Polymyalgia rheumatica: Secondary | ICD-10-CM | POA: Diagnosis not present

## 2021-02-10 DIAGNOSIS — Z951 Presence of aortocoronary bypass graft: Secondary | ICD-10-CM | POA: Diagnosis not present

## 2021-02-10 DIAGNOSIS — I10 Essential (primary) hypertension: Secondary | ICD-10-CM | POA: Diagnosis not present

## 2021-02-10 DIAGNOSIS — G20 Parkinson's disease: Secondary | ICD-10-CM | POA: Diagnosis not present

## 2021-02-13 ENCOUNTER — Telehealth: Payer: Self-pay

## 2021-02-13 DIAGNOSIS — M25512 Pain in left shoulder: Secondary | ICD-10-CM | POA: Diagnosis not present

## 2021-02-13 NOTE — Telephone Encounter (Addendum)
Received voicemail from [facility] a home health Physical therapist who is requested additional therapy. She is requested twice a week for 3 weeks and once a week for 3 weeks and social work consult.   Call back number: [phone]  Is this ok?

## 2021-02-13 NOTE — Telephone Encounter (Signed)
Spoke with Mallory a physical therapist states [facility] is now [employer] [employer]. Gave her the ok per Dr Tat.   She voiced understanding.

## 2021-02-13 NOTE — Telephone Encounter (Signed)
ok 

## 2021-02-14 DIAGNOSIS — I10 Essential (primary) hypertension: Secondary | ICD-10-CM | POA: Diagnosis not present

## 2021-02-14 DIAGNOSIS — Z7902 Long term (current) use of antithrombotics/antiplatelets: Secondary | ICD-10-CM | POA: Diagnosis not present

## 2021-02-14 DIAGNOSIS — I2510 Atherosclerotic heart disease of native coronary artery without angina pectoris: Secondary | ICD-10-CM | POA: Diagnosis not present

## 2021-02-14 DIAGNOSIS — Z951 Presence of aortocoronary bypass graft: Secondary | ICD-10-CM | POA: Diagnosis not present

## 2021-02-14 DIAGNOSIS — G20 Parkinson's disease: Secondary | ICD-10-CM | POA: Diagnosis not present

## 2021-02-14 DIAGNOSIS — E7800 Pure hypercholesterolemia, unspecified: Secondary | ICD-10-CM | POA: Diagnosis not present

## 2021-02-14 DIAGNOSIS — M353 Polymyalgia rheumatica: Secondary | ICD-10-CM | POA: Diagnosis not present

## 2021-02-14 DIAGNOSIS — K219 Gastro-esophageal reflux disease without esophagitis: Secondary | ICD-10-CM | POA: Diagnosis not present

## 2021-02-14 DIAGNOSIS — I779 Disorder of arteries and arterioles, unspecified: Secondary | ICD-10-CM | POA: Diagnosis not present

## 2021-02-17 DIAGNOSIS — G20 Parkinson's disease: Secondary | ICD-10-CM | POA: Diagnosis not present

## 2021-02-17 DIAGNOSIS — I2510 Atherosclerotic heart disease of native coronary artery without angina pectoris: Secondary | ICD-10-CM | POA: Diagnosis not present

## 2021-02-17 DIAGNOSIS — M353 Polymyalgia rheumatica: Secondary | ICD-10-CM | POA: Diagnosis not present

## 2021-02-17 DIAGNOSIS — K219 Gastro-esophageal reflux disease without esophagitis: Secondary | ICD-10-CM | POA: Diagnosis not present

## 2021-02-17 DIAGNOSIS — Z951 Presence of aortocoronary bypass graft: Secondary | ICD-10-CM | POA: Diagnosis not present

## 2021-02-17 DIAGNOSIS — I779 Disorder of arteries and arterioles, unspecified: Secondary | ICD-10-CM | POA: Diagnosis not present

## 2021-02-17 DIAGNOSIS — E7800 Pure hypercholesterolemia, unspecified: Secondary | ICD-10-CM | POA: Diagnosis not present

## 2021-02-17 DIAGNOSIS — Z7902 Long term (current) use of antithrombotics/antiplatelets: Secondary | ICD-10-CM | POA: Diagnosis not present

## 2021-02-17 DIAGNOSIS — I10 Essential (primary) hypertension: Secondary | ICD-10-CM | POA: Diagnosis not present

## 2021-02-20 ENCOUNTER — Telehealth: Payer: Self-pay

## 2021-02-20 DIAGNOSIS — M353 Polymyalgia rheumatica: Secondary | ICD-10-CM | POA: Diagnosis not present

## 2021-02-20 DIAGNOSIS — Z7902 Long term (current) use of antithrombotics/antiplatelets: Secondary | ICD-10-CM | POA: Diagnosis not present

## 2021-02-20 DIAGNOSIS — E7800 Pure hypercholesterolemia, unspecified: Secondary | ICD-10-CM | POA: Diagnosis not present

## 2021-02-20 DIAGNOSIS — I10 Essential (primary) hypertension: Secondary | ICD-10-CM | POA: Diagnosis not present

## 2021-02-20 DIAGNOSIS — I2510 Atherosclerotic heart disease of native coronary artery without angina pectoris: Secondary | ICD-10-CM | POA: Diagnosis not present

## 2021-02-20 DIAGNOSIS — I779 Disorder of arteries and arterioles, unspecified: Secondary | ICD-10-CM | POA: Diagnosis not present

## 2021-02-20 DIAGNOSIS — K219 Gastro-esophageal reflux disease without esophagitis: Secondary | ICD-10-CM | POA: Diagnosis not present

## 2021-02-20 DIAGNOSIS — Z951 Presence of aortocoronary bypass graft: Secondary | ICD-10-CM | POA: Diagnosis not present

## 2021-02-20 DIAGNOSIS — G20 Parkinson's disease: Secondary | ICD-10-CM | POA: Diagnosis not present

## 2021-02-20 NOTE — Telephone Encounter (Signed)
Recieved call from Clare Gandy, Physical therapist who states when he went to visit the patient today for therapy and the patient is complaining of abdominal pain. The patient states his pain is a nine out of of ten. Patient states he has been vomiting all weekend. I asked if the patient has reached out to his pcp and he has not. Gershon Mussel states he was calling our office to make Dr Tat aware because Dr Tat is the only provider in the patients chart.

## 2021-02-21 NOTE — Telephone Encounter (Signed)
This is out of my area of expertise.  Call patient and ask him to go to ER or call PCP

## 2021-02-21 NOTE — Telephone Encounter (Signed)
Spoke with and made him aware of Dr Doristine Devoid recommendations. He voiced understanding.

## 2021-02-22 DIAGNOSIS — I2510 Atherosclerotic heart disease of native coronary artery without angina pectoris: Secondary | ICD-10-CM | POA: Diagnosis not present

## 2021-02-22 DIAGNOSIS — I10 Essential (primary) hypertension: Secondary | ICD-10-CM | POA: Diagnosis not present

## 2021-02-22 DIAGNOSIS — G20 Parkinson's disease: Secondary | ICD-10-CM | POA: Diagnosis not present

## 2021-02-22 DIAGNOSIS — M353 Polymyalgia rheumatica: Secondary | ICD-10-CM | POA: Diagnosis not present

## 2021-02-22 DIAGNOSIS — Z7902 Long term (current) use of antithrombotics/antiplatelets: Secondary | ICD-10-CM | POA: Diagnosis not present

## 2021-02-22 DIAGNOSIS — Z951 Presence of aortocoronary bypass graft: Secondary | ICD-10-CM | POA: Diagnosis not present

## 2021-02-22 DIAGNOSIS — E7800 Pure hypercholesterolemia, unspecified: Secondary | ICD-10-CM | POA: Diagnosis not present

## 2021-02-22 DIAGNOSIS — I779 Disorder of arteries and arterioles, unspecified: Secondary | ICD-10-CM | POA: Diagnosis not present

## 2021-02-22 DIAGNOSIS — K219 Gastro-esophageal reflux disease without esophagitis: Secondary | ICD-10-CM | POA: Diagnosis not present

## 2021-02-23 DIAGNOSIS — M353 Polymyalgia rheumatica: Secondary | ICD-10-CM | POA: Diagnosis not present

## 2021-02-23 DIAGNOSIS — I779 Disorder of arteries and arterioles, unspecified: Secondary | ICD-10-CM | POA: Diagnosis not present

## 2021-02-23 DIAGNOSIS — Z7902 Long term (current) use of antithrombotics/antiplatelets: Secondary | ICD-10-CM | POA: Diagnosis not present

## 2021-02-23 DIAGNOSIS — G20 Parkinson's disease: Secondary | ICD-10-CM | POA: Diagnosis not present

## 2021-02-23 DIAGNOSIS — I2510 Atherosclerotic heart disease of native coronary artery without angina pectoris: Secondary | ICD-10-CM | POA: Diagnosis not present

## 2021-02-23 DIAGNOSIS — Z951 Presence of aortocoronary bypass graft: Secondary | ICD-10-CM | POA: Diagnosis not present

## 2021-02-23 DIAGNOSIS — E7800 Pure hypercholesterolemia, unspecified: Secondary | ICD-10-CM | POA: Diagnosis not present

## 2021-02-23 DIAGNOSIS — K219 Gastro-esophageal reflux disease without esophagitis: Secondary | ICD-10-CM | POA: Diagnosis not present

## 2021-02-23 DIAGNOSIS — I10 Essential (primary) hypertension: Secondary | ICD-10-CM | POA: Diagnosis not present

## 2021-02-24 ENCOUNTER — Telehealth: Payer: Self-pay | Admitting: Neurology

## 2021-02-24 DIAGNOSIS — K219 Gastro-esophageal reflux disease without esophagitis: Secondary | ICD-10-CM | POA: Diagnosis not present

## 2021-02-24 DIAGNOSIS — I2510 Atherosclerotic heart disease of native coronary artery without angina pectoris: Secondary | ICD-10-CM | POA: Diagnosis not present

## 2021-02-24 DIAGNOSIS — G20 Parkinson's disease: Secondary | ICD-10-CM | POA: Diagnosis not present

## 2021-02-24 DIAGNOSIS — E7800 Pure hypercholesterolemia, unspecified: Secondary | ICD-10-CM | POA: Diagnosis not present

## 2021-02-24 DIAGNOSIS — Z7902 Long term (current) use of antithrombotics/antiplatelets: Secondary | ICD-10-CM | POA: Diagnosis not present

## 2021-02-24 DIAGNOSIS — I779 Disorder of arteries and arterioles, unspecified: Secondary | ICD-10-CM | POA: Diagnosis not present

## 2021-02-24 DIAGNOSIS — Z951 Presence of aortocoronary bypass graft: Secondary | ICD-10-CM | POA: Diagnosis not present

## 2021-02-24 DIAGNOSIS — I10 Essential (primary) hypertension: Secondary | ICD-10-CM | POA: Diagnosis not present

## 2021-02-24 DIAGNOSIS — M353 Polymyalgia rheumatica: Secondary | ICD-10-CM | POA: Diagnosis not present

## 2021-02-24 DIAGNOSIS — M67912 Unspecified disorder of synovium and tendon, left shoulder: Secondary | ICD-10-CM | POA: Diagnosis not present

## 2021-02-24 NOTE — Telephone Encounter (Signed)
OT from [facility] called to report the patient reported a fall today.   The patient told him the recliner flipped over on him and he slept that way over night. The OT stated the recliner is heavy and this seems unlikely as there are no injuries, just wanted to report to Dr. Carles Collet.

## 2021-02-26 DIAGNOSIS — M353 Polymyalgia rheumatica: Secondary | ICD-10-CM | POA: Diagnosis not present

## 2021-02-26 DIAGNOSIS — E7800 Pure hypercholesterolemia, unspecified: Secondary | ICD-10-CM | POA: Diagnosis not present

## 2021-02-26 DIAGNOSIS — K219 Gastro-esophageal reflux disease without esophagitis: Secondary | ICD-10-CM | POA: Diagnosis not present

## 2021-02-26 DIAGNOSIS — I2510 Atherosclerotic heart disease of native coronary artery without angina pectoris: Secondary | ICD-10-CM | POA: Diagnosis not present

## 2021-02-26 DIAGNOSIS — Z7902 Long term (current) use of antithrombotics/antiplatelets: Secondary | ICD-10-CM | POA: Diagnosis not present

## 2021-02-26 DIAGNOSIS — I779 Disorder of arteries and arterioles, unspecified: Secondary | ICD-10-CM | POA: Diagnosis not present

## 2021-02-26 DIAGNOSIS — G20 Parkinson's disease: Secondary | ICD-10-CM | POA: Diagnosis not present

## 2021-02-26 DIAGNOSIS — Z951 Presence of aortocoronary bypass graft: Secondary | ICD-10-CM | POA: Diagnosis not present

## 2021-02-26 DIAGNOSIS — I10 Essential (primary) hypertension: Secondary | ICD-10-CM | POA: Diagnosis not present

## 2021-03-01 DIAGNOSIS — I779 Disorder of arteries and arterioles, unspecified: Secondary | ICD-10-CM | POA: Diagnosis not present

## 2021-03-01 DIAGNOSIS — M353 Polymyalgia rheumatica: Secondary | ICD-10-CM | POA: Diagnosis not present

## 2021-03-01 DIAGNOSIS — E7800 Pure hypercholesterolemia, unspecified: Secondary | ICD-10-CM | POA: Diagnosis not present

## 2021-03-01 DIAGNOSIS — Z7902 Long term (current) use of antithrombotics/antiplatelets: Secondary | ICD-10-CM | POA: Diagnosis not present

## 2021-03-01 DIAGNOSIS — Z951 Presence of aortocoronary bypass graft: Secondary | ICD-10-CM | POA: Diagnosis not present

## 2021-03-01 DIAGNOSIS — G20 Parkinson's disease: Secondary | ICD-10-CM | POA: Diagnosis not present

## 2021-03-01 DIAGNOSIS — K219 Gastro-esophageal reflux disease without esophagitis: Secondary | ICD-10-CM | POA: Diagnosis not present

## 2021-03-01 DIAGNOSIS — I10 Essential (primary) hypertension: Secondary | ICD-10-CM | POA: Diagnosis not present

## 2021-03-01 DIAGNOSIS — I2510 Atherosclerotic heart disease of native coronary artery without angina pectoris: Secondary | ICD-10-CM | POA: Diagnosis not present

## 2021-03-02 DIAGNOSIS — K219 Gastro-esophageal reflux disease without esophagitis: Secondary | ICD-10-CM | POA: Diagnosis not present

## 2021-03-02 DIAGNOSIS — I2510 Atherosclerotic heart disease of native coronary artery without angina pectoris: Secondary | ICD-10-CM | POA: Diagnosis not present

## 2021-03-02 DIAGNOSIS — I779 Disorder of arteries and arterioles, unspecified: Secondary | ICD-10-CM | POA: Diagnosis not present

## 2021-03-02 DIAGNOSIS — Z951 Presence of aortocoronary bypass graft: Secondary | ICD-10-CM | POA: Diagnosis not present

## 2021-03-02 DIAGNOSIS — Z7902 Long term (current) use of antithrombotics/antiplatelets: Secondary | ICD-10-CM | POA: Diagnosis not present

## 2021-03-02 DIAGNOSIS — M353 Polymyalgia rheumatica: Secondary | ICD-10-CM | POA: Diagnosis not present

## 2021-03-02 DIAGNOSIS — I10 Essential (primary) hypertension: Secondary | ICD-10-CM | POA: Diagnosis not present

## 2021-03-02 DIAGNOSIS — E7800 Pure hypercholesterolemia, unspecified: Secondary | ICD-10-CM | POA: Diagnosis not present

## 2021-03-02 DIAGNOSIS — G20 Parkinson's disease: Secondary | ICD-10-CM | POA: Diagnosis not present

## 2021-03-13 DIAGNOSIS — Z7902 Long term (current) use of antithrombotics/antiplatelets: Secondary | ICD-10-CM | POA: Diagnosis not present

## 2021-03-13 DIAGNOSIS — I10 Essential (primary) hypertension: Secondary | ICD-10-CM | POA: Diagnosis not present

## 2021-03-13 DIAGNOSIS — K219 Gastro-esophageal reflux disease without esophagitis: Secondary | ICD-10-CM | POA: Diagnosis not present

## 2021-03-13 DIAGNOSIS — M353 Polymyalgia rheumatica: Secondary | ICD-10-CM | POA: Diagnosis not present

## 2021-03-13 DIAGNOSIS — E7800 Pure hypercholesterolemia, unspecified: Secondary | ICD-10-CM | POA: Diagnosis not present

## 2021-03-13 DIAGNOSIS — I779 Disorder of arteries and arterioles, unspecified: Secondary | ICD-10-CM | POA: Diagnosis not present

## 2021-03-13 DIAGNOSIS — G20 Parkinson's disease: Secondary | ICD-10-CM | POA: Diagnosis not present

## 2021-03-13 DIAGNOSIS — I2510 Atherosclerotic heart disease of native coronary artery without angina pectoris: Secondary | ICD-10-CM | POA: Diagnosis not present

## 2021-03-13 DIAGNOSIS — Z951 Presence of aortocoronary bypass graft: Secondary | ICD-10-CM | POA: Diagnosis not present

## 2021-03-14 DIAGNOSIS — I779 Disorder of arteries and arterioles, unspecified: Secondary | ICD-10-CM | POA: Diagnosis not present

## 2021-03-14 DIAGNOSIS — K219 Gastro-esophageal reflux disease without esophagitis: Secondary | ICD-10-CM | POA: Diagnosis not present

## 2021-03-14 DIAGNOSIS — E7800 Pure hypercholesterolemia, unspecified: Secondary | ICD-10-CM | POA: Diagnosis not present

## 2021-03-14 DIAGNOSIS — M353 Polymyalgia rheumatica: Secondary | ICD-10-CM | POA: Diagnosis not present

## 2021-03-14 DIAGNOSIS — Z951 Presence of aortocoronary bypass graft: Secondary | ICD-10-CM | POA: Diagnosis not present

## 2021-03-14 DIAGNOSIS — Z7902 Long term (current) use of antithrombotics/antiplatelets: Secondary | ICD-10-CM | POA: Diagnosis not present

## 2021-03-14 DIAGNOSIS — I10 Essential (primary) hypertension: Secondary | ICD-10-CM | POA: Diagnosis not present

## 2021-03-14 DIAGNOSIS — I2510 Atherosclerotic heart disease of native coronary artery without angina pectoris: Secondary | ICD-10-CM | POA: Diagnosis not present

## 2021-03-14 DIAGNOSIS — G20 Parkinson's disease: Secondary | ICD-10-CM | POA: Diagnosis not present

## 2021-03-15 ENCOUNTER — Telehealth: Payer: Self-pay

## 2021-03-15 NOTE — Telephone Encounter (Signed)
Received a call from [facility] a physical therapist with [facility]; she is requesting an additional week of OT for the patient.   Verbal orders given.

## 2021-03-16 DIAGNOSIS — Z7902 Long term (current) use of antithrombotics/antiplatelets: Secondary | ICD-10-CM | POA: Diagnosis not present

## 2021-03-16 DIAGNOSIS — K219 Gastro-esophageal reflux disease without esophagitis: Secondary | ICD-10-CM | POA: Diagnosis not present

## 2021-03-16 DIAGNOSIS — I10 Essential (primary) hypertension: Secondary | ICD-10-CM | POA: Diagnosis not present

## 2021-03-16 DIAGNOSIS — G20 Parkinson's disease: Secondary | ICD-10-CM | POA: Diagnosis not present

## 2021-03-16 DIAGNOSIS — I2510 Atherosclerotic heart disease of native coronary artery without angina pectoris: Secondary | ICD-10-CM | POA: Diagnosis not present

## 2021-03-16 DIAGNOSIS — E7800 Pure hypercholesterolemia, unspecified: Secondary | ICD-10-CM | POA: Diagnosis not present

## 2021-03-16 DIAGNOSIS — M353 Polymyalgia rheumatica: Secondary | ICD-10-CM | POA: Diagnosis not present

## 2021-03-16 DIAGNOSIS — I779 Disorder of arteries and arterioles, unspecified: Secondary | ICD-10-CM | POA: Diagnosis not present

## 2021-03-16 DIAGNOSIS — Z951 Presence of aortocoronary bypass graft: Secondary | ICD-10-CM | POA: Diagnosis not present

## 2021-03-20 DIAGNOSIS — M353 Polymyalgia rheumatica: Secondary | ICD-10-CM | POA: Diagnosis not present

## 2021-03-20 DIAGNOSIS — K219 Gastro-esophageal reflux disease without esophagitis: Secondary | ICD-10-CM | POA: Diagnosis not present

## 2021-03-20 DIAGNOSIS — G20 Parkinson's disease: Secondary | ICD-10-CM | POA: Diagnosis not present

## 2021-03-20 DIAGNOSIS — I10 Essential (primary) hypertension: Secondary | ICD-10-CM | POA: Diagnosis not present

## 2021-03-20 DIAGNOSIS — Z7902 Long term (current) use of antithrombotics/antiplatelets: Secondary | ICD-10-CM | POA: Diagnosis not present

## 2021-03-20 DIAGNOSIS — I2510 Atherosclerotic heart disease of native coronary artery without angina pectoris: Secondary | ICD-10-CM | POA: Diagnosis not present

## 2021-03-20 DIAGNOSIS — Z951 Presence of aortocoronary bypass graft: Secondary | ICD-10-CM | POA: Diagnosis not present

## 2021-03-20 DIAGNOSIS — E7800 Pure hypercholesterolemia, unspecified: Secondary | ICD-10-CM | POA: Diagnosis not present

## 2021-03-20 DIAGNOSIS — I779 Disorder of arteries and arterioles, unspecified: Secondary | ICD-10-CM | POA: Diagnosis not present

## 2021-05-02 NOTE — Progress Notes (Signed)
Assessment/Plan:   1.  Parkinsons Disease  -needs to take carbidopa/levodopa 25/100 as directed at 7am/11am/4pm.  Discussed regular schedule!  -add carbidopa/levodopa 50/200 CR at bed for cramping  -use walker instead of cane  -Recommended no driving.  2.  Dizziness  -Multifactorial.  Patient with vertigo in the past.  Patient likely with orthostasis more recently.  Dr. Marlou Porch has been working with decreasing/stopping his blood pressure medications.  Is better now with reduction in BP meds   Subjective:   Russell Hebert was seen today in follow up for Parkinsons disease, diagnosed last visit.  My previous records were reviewed prior to todays visit as well as outside records available to me.  Pt with daughter who supplements hx.  We started the patient on levodopa and reports that he is not taking it 3 times per day - taking it 1 in the AM, 2 at bed.   The occupational therapist from [facility] actually called me in March to report a fall.  She stated that the patient told him the recliner flipped over on him and he slept that way overnight, but the occupational therapist felt that that was not likely, as there were no injuries.  Last visit, patient and I talked about trying to get off of the diazepam because he was already having baseline dizziness, and he has done that - "I don't take it unless I need it."  He is doing better in that regard.   Pt doesn't recall other falls.  His biggest c/o is diffuse joint pain.  He does c/o cramping of the feet/legs at bed.  No hallucinations.  Mood has been good.  Current prescribed movement disorder medications: Carbidopa/levodopa 25/100, 1 tablet 3 times per day (started last visit) - pt states that he often misses it at noon and 4pm and so he will take 2 at bedtime (so he takes 1 in the AM and 2 at bed).   PREVIOUS MEDICATIONS: Sinemet  ALLERGIES:   Allergies  Allergen Reactions  . Morphine Anaphylaxis  . Celebrex [Celecoxib]     diarrhea     CURRENT MEDICATIONS:  Outpatient Encounter Medications as of 05/04/2021  Medication Sig  . acetaminophen (TYLENOL) 500 MG tablet 2 tablets as needed  . acetaminophen (TYLENOL) 650 MG CR tablet Take 1,950 mg by mouth in the morning and at bedtime.  . carbidopa-levodopa (SINEMET IR) 25-100 MG tablet Take 1 tablet by mouth 3 (three) times daily for 7 days. 7am/11am/4pm  . carvedilol (COREG) 3.125 MG tablet Take 6.25 mg by mouth daily.  . clopidogrel (PLAVIX) 75 MG tablet Take 75 mg by mouth daily.  [name] COVID-19 mRNA vaccine, Pfizer, 30 MCG/0.3ML injection INJECT AS DIRECTED  . gabapentin (NEURONTIN) 100 MG capsule Take 200 mg by mouth 2 (two) times daily.  [name] omeprazole (PRILOSEC) 20 MG capsule Take 20 mg by mouth daily.  . simvastatin (ZOCOR) 40 MG tablet Take 40 mg by mouth at bedtime.  . meclizine (ANTIVERT) 25 MG tablet 1 tablet (Patient not taking: No sig reported)  . methotrexate (RHEUMATREX) 2.5 MG tablet Take 5 mg by mouth once a week. Take on Wednesdays per patient (Patient not taking: Reported on 05/04/2021)  . traMADol (ULTRAM) 50 MG tablet Take by mouth every 6 (six) hours as needed. (Patient not taking: Reported on 05/04/2021)   No facility-administered encounter medications on file as of 05/04/2021.    Objective:   PHYSICAL EXAMINATION:    VITALS:   Vitals:   05/04/21 1111  BP: 100/74  Pulse: 69  SpO2: 97%  Weight: 179 lb (81.2 kg)  Height: 5\' 7"  (1.702 m)    GEN:  The patient appears stated age and is in NAD. HEENT:  Normocephalic, atraumatic.  The mucous membranes are moist. The superficial temporal arteries are without ropiness or tenderness. CV:  RRR Lungs:  CTAB Neck/HEME:  There are no carotid bruits bilaterally.  Neurological examination:  Orientation: The patient is alert and oriented to person/place Cranial nerves: There is good facial symmetry with facial hypomimia. The speech is fluent and clear. Soft palate rises symmetrically and there is no tongue  deviation. Hearing is intact to conversational tone. Sensation: Sensation is intact to light touch throughout Motor: Strength is at least antigravity x4.  Movement examination: Tone: There is nl tone in the bilateral upper extremities.  The tone in the lower extremities is nl.  Abnormal movements: rare LUE rest tremor (almost none) Coordination:  There is decremation with RAM's, with any form of RAMS, including alternating supination and pronation of the forearm, hand opening and closing, finger taps, heel taps and toe taps, L.   Gait and Station: The patient pushes out of the chair (last time 2 person assist).  He is short stepped with the cane.  I have reviewed and interpreted the following labs independently    Chemistry      Component Value Date/Time   NA 140 12/25/2019 0501   K 3.4 (L) 12/25/2019 0501   CL 109 12/25/2019 0501   CO2 22 12/25/2019 0501   BUN 16 12/25/2019 0501   CREATININE 0.77 12/25/2019 0501      Component Value Date/Time   CALCIUM 8.4 (L) 12/25/2019 0501   ALKPHOS 33 (L) 12/24/2019 0447   AST 18 12/24/2019 0447   ALT 17 12/24/2019 0447   BILITOT 0.6 12/24/2019 0447       Lab Results  Component Value Date   WBC 4.8 12/25/2019   HGB 11.8 (L) 12/25/2019   HCT 35.7 (L) 12/25/2019   MCV 93.9 12/25/2019   PLT 196 12/25/2019    No results found for: TSH   Total time spent on today's visit was 30 minutes, including both face-to-face time and nonface-to-face time.  Time included that spent on review of records (prior notes available to me/labs/imaging if pertinent), discussing treatment and goals, answering patient's questions and coordinating care.  Cc:  Alroy Dust, L.Marlou Sa, MD

## 2021-05-04 ENCOUNTER — Other Ambulatory Visit: Payer: Self-pay

## 2021-05-04 ENCOUNTER — Encounter: Payer: Self-pay | Admitting: Neurology

## 2021-05-04 ENCOUNTER — Ambulatory Visit: Payer: Medicare HMO | Admitting: Neurology

## 2021-05-04 VITALS — BP 100/74 | HR 69 | Ht 67.0 in | Wt 179.0 lb

## 2021-05-04 DIAGNOSIS — G20 Parkinson's disease: Secondary | ICD-10-CM | POA: Diagnosis not present

## 2021-05-04 MED ORDER — CARBIDOPA-LEVODOPA ER 50-200 MG PO TBCR: 1.0000 | EXTENDED_RELEASE_TABLET | Freq: Every day | ORAL | 1 refills | Status: DC

## 2021-05-04 MED ORDER — CARBIDOPA-LEVODOPA 25-100 MG PO TABS: 1.0000 | ORAL_TABLET | Freq: Three times a day (TID) | ORAL | 1 refills | Status: DC

## 2021-05-04 NOTE — Patient Instructions (Addendum)
1.  Take carbidopa/levodopa 25/100, 1 at 7am/11am/4pm 2.  ADD carbidopa/levodopa 50/200 CR at bedtime  Online Resources for Power over Parkinson's Group May 2022  . Local [place] Online Groups  o Power over [employer] :   - Power Over Parkinson's Patient Education Group will be Wednesday, May 11th at 2pm via Zoom.   - Upcoming Power over Parkinson's Meetings:  2nd Wednesdays of the month at 2 pm:  June 8th, July 13th - Contact Amy Marriott at [email or web address] if interested in participating in this online group o Parkinson's Care Partners Group:    3rd Mondays, Contact Misty Paladino o Atypical Parkinsonian Patient Group:   4th Wednesdays, Contact Misty Paladino o If you are interested in participating in these online groups with Misty, please contact her directly for how to join those meetings.  Her contact information is [email or web address].   . [place]:  [email or web address] o PD Health at Home continues:  Mindfulness Mondays, Expert Briefing Tuesdays, Wellness Wednesdays, Take Time Thursdays, Fitness Fridays -Listings for May 2022 are on the website o Upcoming Webinar:  Newly Diagnosed Building a Better Life with Parkinson   Wednesday, May 18th @ 1 pm o Register for Armed forces operational officer) at [email or web address] o  Please check out their website to sign up for emails and see their full online offerings  . [place]:  [email or web address]  o Upcoming Webinar:   What's in your DNA, understanding Parkinson's genetics.  Thursday, May 19th @ 12 noon o Check out additional information on their website to see their full online offerings  . [place]:  [email or web address] o Upcoming Webinar:  Stay tuned o Care Partner Monthly Meetup.  With [place].  First Tuesday of each month, 2 pm o Joy Breaks:  First Wednesday of each month, 2-3 pm. There will be art, doodling, making,  crafting, listening, laughing, stories, and everything in between. No art experience necessary. No supplies required. Just show up for joy!  Register on their website. o Check out additional information to Live Well Today on their website  . Parkinson and Movement Disorders (PMD) Alliance:  www.pmdalliance.org o [employer] Online:  Online Education Events o Sign up for emails, which are sent weekly to give you updates on programming and online offerings     . Parkinson's Association of the [place]:  www.parkinsonassociation.org o Information on online support groups, education events, and online exercises including Yoga, Parkinson's exercises and more-LOTS of information on links to PD resources and online events o Virtual Support Group through Parkinson's Association of the [place]; next one is scheduled for Wednesday, May 4th, 2022 at 2 pm. (These are typically scheduled for the 1st Wednesday of the month at 2 pm).  Visit website for details.  . Additional links for movement activities: o PWR! Moves Classes at [place] RESUMED!  Wednesdays 10 and 11 am.  Contact Amy Marriott, PT [email or web address] or [phone] if interested o Here is a link to the PWR!Moves classes on Zoom from [phone] - Daily Mon-Sat at 10:00. Via Zoom, FREE and open to all.  There is also a link below via Facebook if you use that platform. - [place] - [email or web address]://[email or web address] o Parkinson's Wellness Recovery (PWR! Moves)  www.[email or web address] - Info on the PWR! Virtual Experience:  You will have access to our expertise through self-assessment, guided plans that start with the PD-specific fundamentals, educational content, tips, Q&A with an expert, and a growing PrepaidParty.no of PD-specific  pre-recorded and live exercise  classes of varying types and intensity - both physical and cognitive! If that is not enough, we offer 1:1 wellness consultations (in-person or virtual) to personalize your PWR! Research scientist (medical).  - Check out the PWR! Move of the month on the [place] [facility] website:  [email or web address] o [employer] Fridays:  - As part of the PD Health @ Home program, this free video series focuses each week on one aspect of fitness designed to support people living with Parkinson's.  These weekly videos highlight the [place] recent fitness guidelines for people with Parkinson's disease. -  HollywoodSale.dk o Dance for PD website is offering free, live-stream classes throughout the week, as well as links to [employer] of classes:  [email or web address] o Dance for Parkinson's Class:  [place].  Free offering for people with Parkinson's and care partners; virtual class.  o For more information, contact [phone] or email Ruffin Frederick at [email or web address] o Virtual dance and Pilates for Parkinson's classes: Click on the Community Tab> Parkinson's Movement Initiative Tab.  To register for classes and for more information, visit www.[employer] and click the "community" tab.     o [employer] Parkinson's Cycling Classes  - [facility] [employer]: 1pm on Fridays-Live classes at 06-22-1990 ([employer] at [email or web address] or [phone]) [phone] [employer]: Virtual Classes Mondays and Thursdays [birth year]1986 classes Tuesday, Wednesday and Thursday (contact [place] at [email or web address]  or [phone])  o [place] levels of classes are offered Tuesdays and Thursdays:  10:30 am,  12 noon & 1:45 pm at [facility].  - Active  Stretching with [facility] Class starting in March, on Fridays - To observe a class or for  more information, call [phone] or email [email or web address] . [employer]: o [phone] Opportunities:  www.well-[email or web address]/caregiver-education/caregiver-support-group.  You may also contact Chief Technology Officer at [email or web address] or [phone].   o [facility] for Family Caregivers! Thursday, May 12th 10:00a-1:30p [place] 05-07-1993, [birth year]1998, [place], [place] You may contact [address] at [email or web address] or [phone].   o Well-Spring Navigator:  Just1Navigator program, a free service to help individuals and families through the journey of determining care for older adults.  The "Navigator" is a [facility], 02-02-1990, who will speak with a prospective client and/or loved ones to provide an assessment of the situation and a set of recommendations for a personalized care plan -- all free of charge, and whether [facility] [employer] offers the needed service or not. If the need is not a service we provide, we are well-connected with reputable programs in town that we can refer you to.  www.well-springsolutions.org or to speak with the Navigator, call [phone].

## 2021-05-22 ENCOUNTER — Encounter: Payer: Self-pay | Admitting: Cardiology

## 2021-05-22 DIAGNOSIS — K219 Gastro-esophageal reflux disease without esophagitis: Secondary | ICD-10-CM | POA: Diagnosis not present

## 2021-05-22 DIAGNOSIS — F320 Major depressive disorder, single episode, mild: Secondary | ICD-10-CM | POA: Diagnosis not present

## 2021-05-22 DIAGNOSIS — I10 Essential (primary) hypertension: Secondary | ICD-10-CM | POA: Diagnosis not present

## 2021-05-22 DIAGNOSIS — Z23 Encounter for immunization: Secondary | ICD-10-CM | POA: Diagnosis not present

## 2021-05-22 DIAGNOSIS — E7800 Pure hypercholesterolemia, unspecified: Secondary | ICD-10-CM | POA: Diagnosis not present

## 2021-05-22 DIAGNOSIS — R42 Dizziness and giddiness: Secondary | ICD-10-CM | POA: Diagnosis not present

## 2021-05-22 DIAGNOSIS — Z0000 Encounter for general adult medical examination without abnormal findings: Secondary | ICD-10-CM | POA: Diagnosis not present

## 2021-05-22 DIAGNOSIS — G8929 Other chronic pain: Secondary | ICD-10-CM | POA: Diagnosis not present

## 2021-05-22 DIAGNOSIS — I679 Cerebrovascular disease, unspecified: Secondary | ICD-10-CM | POA: Diagnosis not present

## 2021-10-29 DIAGNOSIS — S20229A Contusion of unspecified back wall of thorax, initial encounter: Secondary | ICD-10-CM | POA: Diagnosis not present

## 2021-10-30 ENCOUNTER — Other Ambulatory Visit: Payer: Self-pay | Admitting: Neurology

## 2021-10-30 DIAGNOSIS — R42 Dizziness and giddiness: Secondary | ICD-10-CM

## 2021-10-30 DIAGNOSIS — G20 Parkinson's disease: Secondary | ICD-10-CM

## 2021-11-03 ENCOUNTER — Other Ambulatory Visit: Payer: Self-pay | Admitting: Orthopedic Surgery

## 2021-11-03 DIAGNOSIS — M546 Pain in thoracic spine: Secondary | ICD-10-CM

## 2021-11-03 DIAGNOSIS — I10 Essential (primary) hypertension: Secondary | ICD-10-CM | POA: Diagnosis not present

## 2021-11-03 DIAGNOSIS — M549 Dorsalgia, unspecified: Secondary | ICD-10-CM | POA: Diagnosis not present

## 2021-11-03 DIAGNOSIS — S22088S Other fracture of T11-T12 vertebra, sequela: Secondary | ICD-10-CM | POA: Diagnosis not present

## 2021-11-03 DIAGNOSIS — I719 Aortic aneurysm of unspecified site, without rupture: Secondary | ICD-10-CM | POA: Diagnosis not present

## 2021-11-03 DIAGNOSIS — M545 Low back pain, unspecified: Secondary | ICD-10-CM

## 2021-11-03 DIAGNOSIS — M439 Deforming dorsopathy, unspecified: Secondary | ICD-10-CM | POA: Diagnosis not present

## 2021-11-03 DIAGNOSIS — M48061 Spinal stenosis, lumbar region without neurogenic claudication: Secondary | ICD-10-CM | POA: Diagnosis not present

## 2021-11-03 DIAGNOSIS — M6281 Muscle weakness (generalized): Secondary | ICD-10-CM | POA: Diagnosis not present

## 2021-11-09 ENCOUNTER — Other Ambulatory Visit: Payer: Medicare HMO

## 2021-11-13 ENCOUNTER — Other Ambulatory Visit: Payer: Medicare HMO

## 2021-11-13 NOTE — Progress Notes (Signed)
Assessment/Plan:   1.  Parkinsons Disease  -Compliance is the biggest issue in the patient not doing well.  Patient currently only taking a single tablet of levodopa in the morning.  Have talked to many visits about it not working this way.  -Discussed again that he needs to take carbidopa/levodopa 25/100 as directed at 7am/11am/4pm.    -Restart carbidopa/levodopa 50/200 CR at bed for cramping.  Sent it to the [employer] because of cost.  -Patient recently had a fairly serious fall and had compression fractures.  At the very least, not taking his medicine was a contributory factor.  -Discussed with patient today that I do not think he will be able to continue to live alone, especially if he does not take his medicine properly.   2.  Dizziness  -Multifactorial.  Patient with vertigo in the past but with orthostasis more recently.  Dr. Marlou Porch has decreased his blood pressure medicine.   Subjective:   Eldredge L Hunnell was seen today in follow up for Parkinsons disease, diagnosed last visit.  My previous records were reviewed prior to todays visit as well as outside records available to me.  Pt with daughter who supplements hx. last visit, the patient was taking his levodopa, 1 in the morning, 2 at bedtime.  I told him that it really does not work this way and asked him to take 1 tablet at 7 AM/11 AM/4 PM and we added extended release levodopa at bedtime.  Daughter reports he is only take 1 pill of levodopa per day total.  Daughter states that she thinks it is less of a memory problem and more of a stubborn problem.  He is not taking the nighttime levodopa at all.  Patient states that it was expensive and he just sent the medication back when it was shipped to him.  There was a fall wed before thanksgiving.  [place] getting the mail in the road - had 3 compression fractions.  He is awaiting mri on mon to see if he can have possible kyphoplasty (seeing ortho).      Current prescribed  movement disorder medications: Carbidopa/levodopa 25/100, 1 tablet 3 times per day (started last visit) - daughter states that he only takes 1 per day Carbidopa/levodopa 50/200 CR at bedtime (started last visit, but patient not taking it at all)  PREVIOUS MEDICATIONS: Sinemet  ALLERGIES:   Allergies  Allergen Reactions   Morphine Anaphylaxis   Celebrex [Celecoxib]     diarrhea    CURRENT MEDICATIONS:  Outpatient Encounter Medications as of 11/14/2021  Medication Sig   acetaminophen (TYLENOL) 500 MG tablet 2 tablets as needed   acetaminophen (TYLENOL) 650 MG CR tablet Take 1,950 mg by mouth in the morning and at bedtime.   carbidopa-levodopa (SINEMET CR) 50-200 MG tablet Take 1 tablet by mouth at bedtime.   carbidopa-levodopa (SINEMET IR) 25-100 MG tablet TAKE 1 TABLET THREE TIMES DAILY AT 7AM, 11AM AND 4PM   carvedilol (COREG) 3.125 MG tablet Take 6.25 mg by mouth daily.   clopidogrel (PLAVIX) 75 MG tablet Take 75 mg by mouth daily.   gabapentin (NEURONTIN) 100 MG capsule Take 200 mg by mouth 2 (two) times daily.   meclizine (ANTIVERT) 25 MG tablet 1 tablet (Patient not taking: No sig reported)   methotrexate (RHEUMATREX) 2.5 MG tablet Take 5 mg by mouth once a week. Take on Wednesdays per patient (Patient not taking: Reported on 05/04/2021)   omeprazole (PRILOSEC) 20 MG capsule Take 20 mg  by mouth daily.   simvastatin (ZOCOR) 40 MG tablet Take 40 mg by mouth at bedtime.   traMADol (ULTRAM) 50 MG tablet Take by mouth every 6 (six) hours as needed. (Patient not taking: Reported on 05/04/2021)   No facility-administered encounter medications on file as of 11/14/2021.    Objective:   PHYSICAL EXAMINATION:    VITALS:   Vitals:   11/14/21 1117  BP: 132/84  Pulse: 94  SpO2: 94%  Weight: 170 lb (77.1 kg)  Height: 5\' 7"  (1.702 m)     GEN:  The patient appears stated age and is in NAD. HEENT:  Normocephalic, atraumatic.  The mucous membranes are moist.   Neurological  examination:  Orientation: The patient is alert and oriented to person/place Cranial nerves: There is good facial symmetry with facial hypomimia. The speech is fluent and clear. Soft palate rises symmetrically and there is no tongue deviation. Hearing is intact to conversational tone. Sensation: Sensation is intact to light touch throughout Motor: Strength is at least antigravity x4.  Movement examination: Tone: There is increased tone in the left greater than right upper extremity Abnormal movements: None Coordination:  There is significant decremation with RAM's, with any form of RAMS, including alternating supination and pronation of the forearm, hand opening and closing, finger taps, heel taps and toe taps, left greater than right Gait and Station: Patient unable due to back pain  I have reviewed and interpreted the following labs independently    Chemistry      Component Value Date/Time   NA 140 12/25/2019 0501   K 3.4 (L) 12/25/2019 0501   CL 109 12/25/2019 0501   CO2 22 12/25/2019 0501   BUN 16 12/25/2019 0501   CREATININE 0.77 12/25/2019 0501      Component Value Date/Time   CALCIUM 8.4 (L) 12/25/2019 0501   ALKPHOS 33 (L) 12/24/2019 0447   AST 18 12/24/2019 0447   ALT 17 12/24/2019 0447   BILITOT 0.6 12/24/2019 0447       Lab Results  Component Value Date   WBC 4.8 12/25/2019   HGB 11.8 (L) 12/25/2019   HCT 35.7 (L) 12/25/2019   MCV 93.9 12/25/2019   PLT 196 12/25/2019    No results found for: TSH   Total time spent on today's visit was 30 minutes, including both face-to-face time and nonface-to-face time.  Time included that spent on review of records (prior notes available to me/labs/imaging if pertinent), discussing treatment and goals, answering patient's questions and coordinating care.  Cc:  Alroy Dust, L.Marlou Sa, MD

## 2021-11-14 ENCOUNTER — Ambulatory Visit: Payer: Medicare HMO | Admitting: Neurology

## 2021-11-14 ENCOUNTER — Encounter: Payer: Self-pay | Admitting: Neurology

## 2021-11-14 ENCOUNTER — Other Ambulatory Visit: Payer: Self-pay

## 2021-11-14 VITALS — BP 132/84 | HR 94 | Ht 67.0 in | Wt 170.0 lb

## 2021-11-14 DIAGNOSIS — K219 Gastro-esophageal reflux disease without esophagitis: Secondary | ICD-10-CM | POA: Diagnosis not present

## 2021-11-14 DIAGNOSIS — F320 Major depressive disorder, single episode, mild: Secondary | ICD-10-CM | POA: Diagnosis not present

## 2021-11-14 DIAGNOSIS — G20 Parkinson's disease: Secondary | ICD-10-CM | POA: Diagnosis not present

## 2021-11-14 DIAGNOSIS — I719 Aortic aneurysm of unspecified site, without rupture: Secondary | ICD-10-CM | POA: Diagnosis not present

## 2021-11-14 DIAGNOSIS — G8929 Other chronic pain: Secondary | ICD-10-CM | POA: Diagnosis not present

## 2021-11-14 DIAGNOSIS — S32000A Wedge compression fracture of unspecified lumbar vertebra, initial encounter for closed fracture: Secondary | ICD-10-CM | POA: Diagnosis not present

## 2021-11-14 DIAGNOSIS — Z23 Encounter for immunization: Secondary | ICD-10-CM | POA: Diagnosis not present

## 2021-11-14 DIAGNOSIS — I10 Essential (primary) hypertension: Secondary | ICD-10-CM | POA: Diagnosis not present

## 2021-11-14 DIAGNOSIS — E7800 Pure hypercholesterolemia, unspecified: Secondary | ICD-10-CM | POA: Diagnosis not present

## 2021-11-14 DIAGNOSIS — I679 Cerebrovascular disease, unspecified: Secondary | ICD-10-CM | POA: Diagnosis not present

## 2021-11-14 MED ORDER — CARBIDOPA-LEVODOPA ER 50-200 MG PO TBCR: 1.0000 | EXTENDED_RELEASE_TABLET | Freq: Every day | ORAL | 1 refills | Status: AC

## 2021-11-14 NOTE — Patient Instructions (Signed)
Take carbidopa/levodopa 25/100 as directed at 7am/11am/4pm.   Start carbidopa/levodopa 50/200 CR at bed (this one helps with cramping and helps the morning pills work faster) - I sent this one to the [facility]

## 2021-11-20 ENCOUNTER — Ambulatory Visit
Admission: RE | Admit: 2021-11-20 | Discharge: 2021-11-20 | Disposition: A | Discharge status: Home / Self Care | Payer: Medicare HMO | Source: Ambulatory Visit | Attending: Orthopedic Surgery | Admitting: Orthopedic Surgery

## 2021-11-20 ENCOUNTER — Other Ambulatory Visit: Payer: Self-pay

## 2021-11-20 DIAGNOSIS — M545 Low back pain, unspecified: Secondary | ICD-10-CM

## 2021-11-20 DIAGNOSIS — R29890 Loss of height: Secondary | ICD-10-CM | POA: Diagnosis not present

## 2021-11-20 DIAGNOSIS — M546 Pain in thoracic spine: Secondary | ICD-10-CM

## 2021-11-20 DIAGNOSIS — M48061 Spinal stenosis, lumbar region without neurogenic claudication: Secondary | ICD-10-CM | POA: Diagnosis not present

## 2021-11-20 DIAGNOSIS — M549 Dorsalgia, unspecified: Secondary | ICD-10-CM | POA: Diagnosis not present

## 2021-11-24 DIAGNOSIS — M545 Low back pain, unspecified: Secondary | ICD-10-CM | POA: Diagnosis not present

## 2021-12-08 DIAGNOSIS — I719 Aortic aneurysm of unspecified site, without rupture: Secondary | ICD-10-CM | POA: Diagnosis not present

## 2021-12-08 DIAGNOSIS — I714 Abdominal aortic aneurysm, without rupture, unspecified: Secondary | ICD-10-CM | POA: Diagnosis not present

## 2021-12-19 DIAGNOSIS — M545 Low back pain, unspecified: Secondary | ICD-10-CM | POA: Diagnosis not present

## 2022-01-16 ENCOUNTER — Ambulatory Visit: Payer: Medicare PPO | Admitting: Cardiology

## 2022-01-16 ENCOUNTER — Other Ambulatory Visit: Payer: Self-pay

## 2022-01-16 ENCOUNTER — Encounter: Payer: Self-pay | Admitting: Cardiology

## 2022-01-16 VITALS — BP 100/70 | HR 76 | Ht 67.0 in | Wt 164.0 lb

## 2022-01-16 DIAGNOSIS — M353 Polymyalgia rheumatica: Secondary | ICD-10-CM

## 2022-01-16 DIAGNOSIS — I2510 Atherosclerotic heart disease of native coronary artery without angina pectoris: Secondary | ICD-10-CM

## 2022-01-16 DIAGNOSIS — E7800 Pure hypercholesterolemia, unspecified: Secondary | ICD-10-CM

## 2022-01-16 DIAGNOSIS — G20 Parkinson's disease: Secondary | ICD-10-CM | POA: Diagnosis not present

## 2022-01-16 DIAGNOSIS — Z951 Presence of aortocoronary bypass graft: Secondary | ICD-10-CM

## 2022-01-16 NOTE — Progress Notes (Signed)
Cardiology Office Note:    Date:  01/16/2022   ID:  Russell Hebert, DOB [birth year], MRN [id number]  PCP:  Russell Graff.Marlou Sa, MD   [place] Providers Cardiologist:  Russell Furbish, MD     Referring MD: Russell Hebert, Russell Hebert.Marlou Sa, MD    History of Present Illness:    Russell Hebert is a 76 y.o. male here for the follow-up of CAD prior CABG in the year 02-23-1999.  Had right thalamic intracranial hemorrhage in 02-23-03  Bare-metal stent to circumflex in 02/23/07 with lateral myocardial infarction  Cardiac catheterization 02/23/08, stent was patent.  States that he had 3 total stents placed.  I previously used to take care of his wife with end-stage renal disease who died in 02/23/2023 or Feb 22, 2018.  He has a daughter who lives in [place].  In the past did not tolerate statins such as Lipitor or Crestor.  He was able to tolerate simvastatin.  Dr. Trudie Hebert previously diagnosed polymyalgia rheumatica with prednisone use.  In 02-23-20 had an episode of syncope where he passed out at the local bank.  He was having diarrhea intermittently for 3 weeks.  Was given IV fluids.  Echo was normal  Creatinine was 1.5 on admission and 0.7 on discharge.  Troponin was 5.  We will go ahead and stop his carvedilol low-dose.  He has been feeling some dizziness and low blood pressures when getting up.  Back pain.  Waiting to get more pain medication.  Past Medical History:  Diagnosis Date   Carotid artery disease ([place])    Doppler, December, 2010, 0-39% bilateral   Coronary artery disease    Acute lateral MI October, 2008, bare-metal stent circumflex /  catheterization November, 2009, LIMA-LAD patent,, all vein grafts patent OM stent patent... some sluggish distal flow... medical therapy   Ejection fraction    GERD (gastroesophageal reflux disease)    Hx of CABG    02/23/1999   Hypercholesterolemia    Simvastatin started 02-23-2008   Hypertension    Intracerebral bleed ([place])    right thalamic, February 23, 2003, despite this tolerates aspirin and  Plavix   TIA (transient ischemic attack) Feb 22, 2005    Past Surgical History:  Procedure Laterality Date   CORONARY ARTERY BYPASS GRAFT  2000   Acute lateral MI and bare-metal stent circumflex..(10/08) Cath 10/14/08; LIMA to the LAD patent ; all vein grafts patent; OM stent patent..some sluggish distal flow.    Current Medications: Current Meds  Medication Sig   carbidopa-levodopa (SINEMET CR) 50-200 MG tablet Take 1 tablet by mouth at bedtime.   carbidopa-levodopa (SINEMET IR) 25-100 MG tablet TAKE 1 TABLET THREE TIMES DAILY AT 7AM, 11AM AND 4PM   clopidogrel (PLAVIX) 75 MG tablet Take 75 mg by mouth daily.   diazepam (VALIUM) 5 MG tablet Take 5 mg by mouth every 6 (six) hours as needed for anxiety.   gabapentin (NEURONTIN) 100 MG capsule Take 200 mg by mouth 2 (two) times daily.   HYDROcodone-acetaminophen (NORCO/VICODIN) 5-325 MG tablet Take 1 tablet by mouth every 4 (four) hours as needed.   omeprazole (PRILOSEC) 20 MG capsule Take 20 mg by mouth daily.   predniSONE (DELTASONE) 20 MG tablet 2 tablet   simvastatin (ZOCOR) 40 MG tablet Take 40 mg by mouth at bedtime.   traMADol (ULTRAM) 50 MG tablet Take by mouth every 6 (six) hours as needed.   [DISCONTINUED] carvedilol (COREG) 3.125 MG tablet Take 6.25 mg by mouth daily.     Allergies:   Morphine and Celebrex [celecoxib]  Social History   Socioeconomic History   Marital status: Married    Spouse name: Not on file   Number of children: Not on file   Years of education: Not on file   Highest education level: Not on file  Occupational History   Occupation: retired    Comment: heating and air conditioning  Tobacco Use   Smoking status: Never   Smokeless tobacco: Never  Vaping Use   Vaping Use: Never used  Substance and Sexual Activity   Alcohol use: Yes    Comment: causal   Drug use: No   Sexual activity: Not Currently  Other Topics Concern   Not on file  Social History Narrative   Right Handed   Lives in a one story  home    Social Determinants of Health   Financial Resource Strain: Not on file  Food Insecurity: Not on file  Transportation Needs: Not on file  Physical Activity: Not on file  Stress: Not on file  Social Connections: Not on file     Family History: The patient's family history includes Heart disease in his mother; Heart failure in his father and mother.  ROS:   Please see the history of present illness.     All other systems reviewed and are negative.  EKGs/Labs/Other Studies Reviewed:    The following studies were reviewed today:   ECHO 12/24/19:   1. Left ventricular ejection fraction, by visual estimation, is 60 to  65%. The left ventricle has normal function. There is no left ventricular  hypertrophy.   2. Left ventricular diastolic parameters are consistent with Grade I  diastolic dysfunction (impaired relaxation).   3. The left ventricle has no regional wall motion abnormalities.   4. Global right ventricle has normal systolic function.The right  ventricular size is normal. No increase in right ventricular wall  thickness.   5. Left atrial size was normal.   6. Right atrial size was normal.   7. The mitral valve is normal in structure. Trivial mitral valve  regurgitation. No evidence of mitral stenosis.   8. The tricuspid valve is normal in structure. Tricuspid valve  regurgitation is trivial.   9. The aortic valve is tricuspid. Aortic valve regurgitation is trivial.  Mild to moderate aortic valve sclerosis/calcification without any evidence  of aortic stenosis.  10. The tricuspid regurgitant velocity is 1.94 m/s, and with an assumed  right atrial pressure of 3 mmHg, the estimated right ventricular systolic  pressure is normal at 18.1 mmHg.  11. The inferior vena cava is normal in size with greater than 50%  respiratory variability, suggesting right atrial pressure of 3 mmHg.  12. Trivial pericardial effusion is present.   EKG:  EKG is  ordered today.  The ekg  ordered today demonstrates sinus rhythm 92 with PVC no other abnormalities  Recent Labs: No results found for requested labs within last 8760 hours.  Recent Lipid Panel    Component Value Date/Time   CHOL 204 (HH) 01/18/2009 0940   TRIG 109 01/18/2009 0940   HDL 59.1 01/18/2009 0940   CHOLHDL 3.5 CALC 01/18/2009 0940   VLDL 22 01/18/2009 0940   LDLCALC  10/03/2007 0300    92        Total Cholesterol/HDL:CHD Risk Coronary Heart Disease Risk Table                     Men   Women  1/2 Average Risk   3.4   3.3  LDLDIRECT 121.3 01/18/2009 0940     Risk Assessment/Calculations:              Physical Exam:    VS:  BP 100/70 (BP Location: Left Arm, Patient Position: Sitting, Cuff Size: Normal)    Pulse 76    Ht 5\' 7"  (1.702 m)    Wt 164 lb (74.4 kg)    SpO2 93%    BMI 25.69 kg/m     Wt Readings from Last 3 Encounters:  01/16/22 164 lb (74.4 kg)  11/14/21 170 lb (77.1 kg)  05/04/21 179 lb (81.2 kg)     GEN:  Well nourished, well developed in no acute distress HEENT: Normal NECK: No JVD; No carotid bruits LYMPHATICS: No lymphadenopathy CARDIAC: RRR, no murmurs, no rubs, gallops RESPIRATORY:  Clear to auscultation without rales, wheezing or rhonchi  ABDOMEN: Soft, non-tender, non-distended MUSCULOSKELETAL:  No edema; No deformity  SKIN: Warm and dry NEUROLOGIC:  Alert and oriented x 3 PSYCHIATRIC:  Normal affect   ASSESSMENT:    1. Coronary artery disease involving native coronary artery of native heart without angina pectoris   2. Hx of CABG   3. Hypercholesterolemia   4. PMR (polymyalgia rheumatica) (HCC)   5. Parkinson's disease ([place])    PLAN:    In order of problems listed above:  Coronary artery disease Prior CABG 2000, 3 stents placed post CABG with a lateral infarct in 2008.  Circumflex.  Overall doing well without any anginal symptoms.  Continuing with Plavix.  No aspirin.  I will go ahead and stop his carvedilol because of hypotension.  Continue with  simvastatin.  Hx of CABG Year 2000.  Hypercholesterolemia Continue with simvastatin.  Could not tolerate other types of cholesterol medication.  Last LDL 95.  PMR (polymyalgia rheumatica) (HCC) Continue with steroid use.  Parkinsonian syndrome ([place]) Followed by Dr. Carles Collet.  Sinemet.         Medication Adjustments/Labs and Tests Ordered: Current medicines are reviewed at length with the patient today.  Concerns regarding medicines are outlined above.  Orders Placed This Encounter  Procedures   EKG 12-Lead   No orders of the defined types were placed in this encounter.   Patient Instructions  Medication Instructions:  Please discontinue your Carvedilol. Continue all other medications as listed.  *If you need a refill on your cardiac medications before your next appointment, please call your pharmacy*  Follow-Up: At [facility], you and your health needs are our priority.  As part of our continuing mission to provide you with exceptional heart care, we have created designated Provider Care Teams.  These Care Teams include your primary Cardiologist (physician) and Advanced Practice Providers (APPs -  Physician Assistants and Nurse Practitioners) who all work together to provide you with the care you need, when you need it.  We recommend signing up for the patient portal called "MyChart".  Sign up information is provided on this After Visit Summary.  MyChart is used to connect with patients for Virtual Visits (Telemedicine).  Patients are able to view lab/test results, encounter notes, upcoming appointments, etc.  Non-urgent messages can be sent to your provider as well.   To learn more about what you can do with MyChart, go to [email or web address].    Your next appointment:   1 year(s)  The format for your next appointment:   In Person  Provider:   Candee Furbish, MD    Thank you for choosing [facility]!!      Signed,  Russell Furbish, MD  01/16/2022 4:48 PM     [place]

## 2022-01-16 NOTE — Assessment & Plan Note (Signed)
Year 2000.

## 2022-01-16 NOTE — Assessment & Plan Note (Signed)
Continue with simvastatin.  Could not tolerate other types of cholesterol medication.  Last LDL 95.

## 2022-01-16 NOTE — Assessment & Plan Note (Signed)
Continue with steroid use.

## 2022-01-16 NOTE — Patient Instructions (Signed)
Medication Instructions:  Please discontinue your Carvedilol. Continue all other medications as listed.  *If you need a refill on your cardiac medications before your next appointment, please call your pharmacy*  Follow-Up: At [facility], you and your health needs are our priority.  As part of our continuing mission to provide you with exceptional heart care, we have created designated Provider Care Teams.  These Care Teams include your primary Cardiologist (physician) and Advanced Practice Providers (APPs -  Physician Assistants and Nurse Practitioners) who all work together to provide you with the care you need, when you need it.  We recommend signing up for the patient portal called "MyChart".  Sign up information is provided on this After Visit Summary.  MyChart is used to connect with patients for Virtual Visits (Telemedicine).  Patients are able to view lab/test results, encounter notes, upcoming appointments, etc.  Non-urgent messages can be sent to your provider as well.   To learn more about what you can do with MyChart, go to [email or web address].    Your next appointment:   1 year(s)  The format for your next appointment:   In Person  Provider:   Candee Furbish, MD    Thank you for choosing [facility]!!

## 2022-01-16 NOTE — Assessment & Plan Note (Signed)
Followed by Dr. Carles Collet.  Sinemet.

## 2022-01-16 NOTE — Assessment & Plan Note (Signed)
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  Circumflex.  Overall doing well without any anginal symptoms.  Continuing with Plavix.  No aspirin.  I will go ahead and stop his carvedilol because of hypotension.  Continue with simvastatin.

## 2022-04-03 DIAGNOSIS — M5451 Vertebrogenic low back pain: Secondary | ICD-10-CM | POA: Diagnosis not present

## 2022-04-03 DIAGNOSIS — M546 Pain in thoracic spine: Secondary | ICD-10-CM | POA: Diagnosis not present

## 2022-05-01 ENCOUNTER — Other Ambulatory Visit: Payer: Self-pay

## 2022-05-01 ENCOUNTER — Inpatient Hospital Stay (HOSPITAL_COMMUNITY): Payer: Medicare PPO

## 2022-05-01 ENCOUNTER — Inpatient Hospital Stay (HOSPITAL_COMMUNITY)
Admission: EM | Admit: 2022-05-01 | Discharge: 2022-06-02 | DRG: 438 | Disposition: E | Payer: Medicare PPO | Attending: Family Medicine | Admitting: Family Medicine

## 2022-05-01 ENCOUNTER — Emergency Department (HOSPITAL_COMMUNITY): Payer: Medicare PPO

## 2022-05-01 DIAGNOSIS — K219 Gastro-esophageal reflux disease without esophagitis: Secondary | ICD-10-CM | POA: Diagnosis present

## 2022-05-01 DIAGNOSIS — I700 Atherosclerosis of aorta: Secondary | ICD-10-CM | POA: Diagnosis not present

## 2022-05-01 DIAGNOSIS — R000 Tachycardia, unspecified: Secondary | ICD-10-CM

## 2022-05-01 DIAGNOSIS — K852 Alcohol induced acute pancreatitis without necrosis or infection: Principal | ICD-10-CM | POA: Diagnosis present

## 2022-05-01 DIAGNOSIS — K6389 Other specified diseases of intestine: Secondary | ICD-10-CM | POA: Diagnosis not present

## 2022-05-01 DIAGNOSIS — F1010 Alcohol abuse, uncomplicated: Secondary | ICD-10-CM | POA: Diagnosis present

## 2022-05-01 DIAGNOSIS — Z885 Allergy status to narcotic agent status: Secondary | ICD-10-CM

## 2022-05-01 DIAGNOSIS — E872 Acidosis, unspecified: Secondary | ICD-10-CM | POA: Diagnosis present

## 2022-05-01 DIAGNOSIS — E870 Hyperosmolality and hypernatremia: Secondary | ICD-10-CM

## 2022-05-01 DIAGNOSIS — D72829 Elevated white blood cell count, unspecified: Secondary | ICD-10-CM

## 2022-05-01 DIAGNOSIS — G9341 Metabolic encephalopathy: Secondary | ICD-10-CM | POA: Diagnosis not present

## 2022-05-01 DIAGNOSIS — M4855XA Collapsed vertebra, not elsewhere classified, thoracolumbar region, initial encounter for fracture: Secondary | ICD-10-CM | POA: Diagnosis present

## 2022-05-01 DIAGNOSIS — I959 Hypotension, unspecified: Secondary | ICD-10-CM | POA: Diagnosis not present

## 2022-05-01 DIAGNOSIS — Z20822 Contact with and (suspected) exposure to covid-19: Secondary | ICD-10-CM | POA: Diagnosis not present

## 2022-05-01 DIAGNOSIS — K8020 Calculus of gallbladder without cholecystitis without obstruction: Secondary | ICD-10-CM | POA: Diagnosis present

## 2022-05-01 DIAGNOSIS — F419 Anxiety disorder, unspecified: Secondary | ICD-10-CM | POA: Diagnosis present

## 2022-05-01 DIAGNOSIS — Z515 Encounter for palliative care: Secondary | ICD-10-CM

## 2022-05-01 DIAGNOSIS — R1310 Dysphagia, unspecified: Secondary | ICD-10-CM | POA: Diagnosis present

## 2022-05-01 DIAGNOSIS — E43 Unspecified severe protein-calorie malnutrition: Secondary | ICD-10-CM | POA: Diagnosis present

## 2022-05-01 DIAGNOSIS — J9811 Atelectasis: Secondary | ICD-10-CM | POA: Diagnosis not present

## 2022-05-01 DIAGNOSIS — R54 Age-related physical debility: Secondary | ICD-10-CM | POA: Diagnosis present

## 2022-05-01 DIAGNOSIS — E875 Hyperkalemia: Secondary | ICD-10-CM

## 2022-05-01 DIAGNOSIS — R091 Pleurisy: Secondary | ICD-10-CM | POA: Diagnosis not present

## 2022-05-01 DIAGNOSIS — I2510 Atherosclerotic heart disease of native coronary artery without angina pectoris: Secondary | ICD-10-CM | POA: Diagnosis present

## 2022-05-01 DIAGNOSIS — R7989 Other specified abnormal findings of blood chemistry: Secondary | ICD-10-CM

## 2022-05-01 DIAGNOSIS — Z8249 Family history of ischemic heart disease and other diseases of the circulatory system: Secondary | ICD-10-CM

## 2022-05-01 DIAGNOSIS — J918 Pleural effusion in other conditions classified elsewhere: Secondary | ICD-10-CM | POA: Diagnosis present

## 2022-05-01 DIAGNOSIS — M4856XA Collapsed vertebra, not elsewhere classified, lumbar region, initial encounter for fracture: Secondary | ICD-10-CM | POA: Diagnosis present

## 2022-05-01 DIAGNOSIS — R627 Adult failure to thrive: Secondary | ICD-10-CM | POA: Diagnosis not present

## 2022-05-01 DIAGNOSIS — I7121 Aneurysm of the ascending aorta, without rupture: Secondary | ICD-10-CM | POA: Diagnosis present

## 2022-05-01 DIAGNOSIS — E162 Hypoglycemia, unspecified: Secondary | ICD-10-CM | POA: Diagnosis not present

## 2022-05-01 DIAGNOSIS — J869 Pyothorax without fistula: Secondary | ICD-10-CM | POA: Diagnosis not present

## 2022-05-01 DIAGNOSIS — J189 Pneumonia, unspecified organism: Secondary | ICD-10-CM | POA: Diagnosis present

## 2022-05-01 DIAGNOSIS — K4090 Unilateral inguinal hernia, without obstruction or gangrene, not specified as recurrent: Secondary | ICD-10-CM | POA: Diagnosis not present

## 2022-05-01 DIAGNOSIS — Z8673 Personal history of transient ischemic attack (TIA), and cerebral infarction without residual deficits: Secondary | ICD-10-CM

## 2022-05-01 DIAGNOSIS — N1411 Contrast-induced nephropathy: Secondary | ICD-10-CM | POA: Diagnosis present

## 2022-05-01 DIAGNOSIS — Z79899 Other long term (current) drug therapy: Secondary | ICD-10-CM

## 2022-05-01 DIAGNOSIS — K859 Acute pancreatitis without necrosis or infection, unspecified: Secondary | ICD-10-CM | POA: Diagnosis not present

## 2022-05-01 DIAGNOSIS — S2242XA Multiple fractures of ribs, left side, initial encounter for closed fracture: Secondary | ICD-10-CM | POA: Diagnosis not present

## 2022-05-01 DIAGNOSIS — I10 Essential (primary) hypertension: Secondary | ICD-10-CM | POA: Diagnosis present

## 2022-05-01 DIAGNOSIS — G20C Parkinsonism, unspecified: Secondary | ICD-10-CM

## 2022-05-01 DIAGNOSIS — R932 Abnormal findings on diagnostic imaging of liver and biliary tract: Secondary | ICD-10-CM | POA: Diagnosis not present

## 2022-05-01 DIAGNOSIS — J90 Pleural effusion, not elsewhere classified: Secondary | ICD-10-CM | POA: Diagnosis not present

## 2022-05-01 DIAGNOSIS — I779 Disorder of arteries and arterioles, unspecified: Secondary | ICD-10-CM | POA: Diagnosis present

## 2022-05-01 DIAGNOSIS — Z955 Presence of coronary angioplasty implant and graft: Secondary | ICD-10-CM

## 2022-05-01 DIAGNOSIS — I160 Hypertensive urgency: Secondary | ICD-10-CM | POA: Diagnosis present

## 2022-05-01 DIAGNOSIS — N179 Acute kidney failure, unspecified: Secondary | ICD-10-CM | POA: Diagnosis not present

## 2022-05-01 DIAGNOSIS — Z7189 Other specified counseling: Secondary | ICD-10-CM

## 2022-05-01 DIAGNOSIS — K8689 Other specified diseases of pancreas: Secondary | ICD-10-CM | POA: Diagnosis not present

## 2022-05-01 DIAGNOSIS — Z4682 Encounter for fitting and adjustment of non-vascular catheter: Secondary | ICD-10-CM | POA: Diagnosis not present

## 2022-05-01 DIAGNOSIS — I712 Thoracic aortic aneurysm, without rupture, unspecified: Secondary | ICD-10-CM | POA: Diagnosis not present

## 2022-05-01 DIAGNOSIS — T508X5A Adverse effect of diagnostic agents, initial encounter: Secondary | ICD-10-CM | POA: Diagnosis present

## 2022-05-01 DIAGNOSIS — E8809 Other disorders of plasma-protein metabolism, not elsewhere classified: Secondary | ICD-10-CM | POA: Diagnosis present

## 2022-05-01 DIAGNOSIS — T40605A Adverse effect of unspecified narcotics, initial encounter: Secondary | ICD-10-CM | POA: Diagnosis present

## 2022-05-01 DIAGNOSIS — Z66 Do not resuscitate: Secondary | ICD-10-CM | POA: Diagnosis not present

## 2022-05-01 DIAGNOSIS — G20 Parkinson's disease: Secondary | ICD-10-CM | POA: Diagnosis present

## 2022-05-01 DIAGNOSIS — K449 Diaphragmatic hernia without obstruction or gangrene: Secondary | ICD-10-CM | POA: Diagnosis present

## 2022-05-01 DIAGNOSIS — Z9181 History of falling: Secondary | ICD-10-CM

## 2022-05-01 DIAGNOSIS — Z7952 Long term (current) use of systemic steroids: Secondary | ICD-10-CM

## 2022-05-01 DIAGNOSIS — R918 Other nonspecific abnormal finding of lung field: Secondary | ICD-10-CM | POA: Diagnosis not present

## 2022-05-01 DIAGNOSIS — M549 Dorsalgia, unspecified: Secondary | ICD-10-CM | POA: Diagnosis not present

## 2022-05-01 DIAGNOSIS — R008 Other abnormalities of heart beat: Secondary | ICD-10-CM | POA: Diagnosis not present

## 2022-05-01 DIAGNOSIS — Z888 Allergy status to other drugs, medicaments and biological substances status: Secondary | ICD-10-CM

## 2022-05-01 DIAGNOSIS — F05 Delirium due to known physiological condition: Secondary | ICD-10-CM | POA: Diagnosis not present

## 2022-05-01 DIAGNOSIS — M353 Polymyalgia rheumatica: Secondary | ICD-10-CM | POA: Diagnosis present

## 2022-05-01 DIAGNOSIS — I252 Old myocardial infarction: Secondary | ICD-10-CM

## 2022-05-01 DIAGNOSIS — Z92241 Personal history of systemic steroid therapy: Secondary | ICD-10-CM

## 2022-05-01 DIAGNOSIS — M545 Low back pain, unspecified: Secondary | ICD-10-CM | POA: Diagnosis not present

## 2022-05-01 DIAGNOSIS — Z951 Presence of aortocoronary bypass graft: Secondary | ICD-10-CM

## 2022-05-01 DIAGNOSIS — A419 Sepsis, unspecified organism: Secondary | ICD-10-CM | POA: Diagnosis not present

## 2022-05-01 DIAGNOSIS — R6510 Systemic inflammatory response syndrome (SIRS) of non-infectious origin without acute organ dysfunction: Secondary | ICD-10-CM

## 2022-05-01 DIAGNOSIS — J9601 Acute respiratory failure with hypoxia: Secondary | ICD-10-CM

## 2022-05-01 DIAGNOSIS — Z789 Other specified health status: Secondary | ICD-10-CM | POA: Diagnosis not present

## 2022-05-01 DIAGNOSIS — Z6825 Body mass index (BMI) 25.0-25.9, adult: Secondary | ICD-10-CM

## 2022-05-01 DIAGNOSIS — E7800 Pure hypercholesterolemia, unspecified: Secondary | ICD-10-CM | POA: Diagnosis present

## 2022-05-01 DIAGNOSIS — E876 Hypokalemia: Secondary | ICD-10-CM

## 2022-05-01 DIAGNOSIS — R1030 Lower abdominal pain, unspecified: Secondary | ICD-10-CM | POA: Diagnosis not present

## 2022-05-01 DIAGNOSIS — K820 Obstruction of gallbladder: Secondary | ICD-10-CM | POA: Diagnosis not present

## 2022-05-01 DIAGNOSIS — Z7902 Long term (current) use of antithrombotics/antiplatelets: Secondary | ICD-10-CM

## 2022-05-01 DIAGNOSIS — I493 Ventricular premature depolarization: Secondary | ICD-10-CM | POA: Diagnosis not present

## 2022-05-01 DIAGNOSIS — R109 Unspecified abdominal pain: Secondary | ICD-10-CM | POA: Diagnosis not present

## 2022-05-01 DIAGNOSIS — R1084 Generalized abdominal pain: Secondary | ICD-10-CM | POA: Diagnosis not present

## 2022-05-01 DIAGNOSIS — R0602 Shortness of breath: Secondary | ICD-10-CM | POA: Diagnosis not present

## 2022-05-01 LAB — CBC WITH DIFFERENTIAL/PLATELET
Abs Immature Granulocytes: 0.07 10*3/uL (ref 0.00–0.07)
Basophils Absolute: 0.1 10*3/uL (ref 0.0–0.1)
Basophils Relative: 0 %
Eosinophils Absolute: 0 10*3/uL (ref 0.0–0.5)
Eosinophils Relative: 0 %
HCT: 43.1 % (ref 39.0–52.0)
Hemoglobin: 13.4 g/dL (ref 13.0–17.0)
Immature Granulocytes: 1 %
Lymphocytes Relative: 6 %
Lymphs Abs: 0.9 10*3/uL (ref 0.7–4.0)
MCH: 28.7 pg (ref 26.0–34.0)
MCHC: 31.1 g/dL (ref 30.0–36.0)
MCV: 92.3 fL (ref 80.0–100.0)
Monocytes Absolute: 0.4 10*3/uL (ref 0.1–1.0)
Monocytes Relative: 3 %
Neutro Abs: 14 10*3/uL — ABNORMAL HIGH (ref 1.7–7.7)
Neutrophils Relative %: 90 %
Platelets: 447 10*3/uL — ABNORMAL HIGH (ref 150–400)
RBC: 4.67 MIL/uL (ref 4.22–5.81)
RDW: 14.2 % (ref 11.5–15.5)
WBC: 15.5 10*3/uL — ABNORMAL HIGH (ref 4.0–10.5)
nRBC: 0 % (ref 0.0–0.2)

## 2022-05-01 LAB — BASIC METABOLIC PANEL
Anion gap: 14 (ref 5–15)
BUN: 17 mg/dL (ref 8–23)
CO2: 18 mmol/L — ABNORMAL LOW (ref 22–32)
Calcium: 9.1 mg/dL (ref 8.9–10.3)
Chloride: 108 mmol/L (ref 98–111)
Creatinine, Ser: 1.07 mg/dL (ref 0.61–1.24)
GFR, Estimated: >60 mL/min (ref >60)
Glucose, Bld: 188 mg/dL — ABNORMAL HIGH (ref 70–99)
Potassium: 3.3 mmol/L — ABNORMAL LOW (ref 3.5–5.1)
Sodium: 140 mmol/L (ref 135–145)

## 2022-05-01 LAB — URINALYSIS, ROUTINE W REFLEX MICROSCOPIC
Bilirubin Urine: NEGATIVE
Glucose, UA: NEGATIVE mg/dL
Hgb urine dipstick: NEGATIVE
Ketones, ur: 5 mg/dL — AB
Leukocytes,Ua: NEGATIVE
Nitrite: NEGATIVE
Protein, ur: NEGATIVE mg/dL
Specific Gravity, Urine: >1.046 — ABNORMAL HIGH (ref 1.005–1.030)
pH: 5 (ref 5.0–8.0)

## 2022-05-01 LAB — I-STAT CHEM 8, ED
BUN: 17 mg/dL (ref 8–23)
Calcium, Ion: 1.02 mmol/L — ABNORMAL LOW (ref 1.15–1.40)
Chloride: 111 mmol/L (ref 98–111)
Creatinine, Ser: 0.8 mg/dL (ref 0.61–1.24)
Glucose, Bld: 183 mg/dL — ABNORMAL HIGH (ref 70–99)
HCT: 42 % (ref 39.0–52.0)
Hemoglobin: 14.3 g/dL (ref 13.0–17.0)
Potassium: 3.2 mmol/L — ABNORMAL LOW (ref 3.5–5.1)
Sodium: 141 mmol/L (ref 135–145)
TCO2: 17 mmol/L — ABNORMAL LOW (ref 22–32)

## 2022-05-01 LAB — MAGNESIUM: Magnesium: 1.9 mg/dL (ref 1.7–2.4)

## 2022-05-01 LAB — TRIGLYCERIDES: Triglycerides: 124 mg/dL (ref <150)

## 2022-05-01 LAB — LIPASE, BLOOD: Lipase: 6730 U/L — ABNORMAL HIGH (ref 11–51)

## 2022-05-01 MED ORDER — ACETAMINOPHEN 650 MG RE SUPP: 650.0000 mg | Freq: Four times a day (QID) | RECTAL | Status: DC | PRN

## 2022-05-01 MED ORDER — CARBIDOPA-LEVODOPA ER 50-200 MG PO TBCR: 1.0000 | EXTENDED_RELEASE_TABLET | Freq: Every day | ORAL | Status: DC

## 2022-05-01 MED ORDER — ACETAMINOPHEN 325 MG PO TABS
650.0000 mg | ORAL_TABLET | Freq: Four times a day (QID) | ORAL | Status: DC | PRN
  Administered 2022-05-02 – 2022-05-08 (×9): 650 mg via ORAL
  Filled 2022-05-01 (×9): qty 2

## 2022-05-01 MED ORDER — HYDROMORPHONE HCL 1 MG/ML IJ SOLN
1.0000 mg | Freq: Once | INTRAMUSCULAR | Status: AC
  Administered 2022-05-01: 1 mg via INTRAVENOUS
  Filled 2022-05-01: qty 1

## 2022-05-01 MED ORDER — FENTANYL CITRATE PF 50 MCG/ML IJ SOSY
PREFILLED_SYRINGE | INTRAMUSCULAR | Status: AC
  Administered 2022-05-01: 100 ug via INTRAVENOUS
  Filled 2022-05-01: qty 2

## 2022-05-01 MED ORDER — ENOXAPARIN SODIUM 40 MG/0.4ML IJ SOSY
40.0000 mg | PREFILLED_SYRINGE | INTRAMUSCULAR | Status: DC
  Administered 2022-05-01 – 2022-05-02 (×2): 40 mg via SUBCUTANEOUS
  Filled 2022-05-01 (×2): qty 0.4

## 2022-05-01 MED ORDER — ONDANSETRON HCL 4 MG PO TABS: 4.0000 mg | ORAL_TABLET | Freq: Four times a day (QID) | ORAL | Status: DC | PRN

## 2022-05-01 MED ORDER — PANTOPRAZOLE SODIUM 40 MG IV SOLR
40.0000 mg | INTRAVENOUS | Status: DC
Start: 2022-05-01 — End: 2022-05-09
  Administered 2022-05-01 – 2022-05-08 (×8): 40 mg via INTRAVENOUS
  Filled 2022-05-01 (×8): qty 10

## 2022-05-01 MED ORDER — PREDNISONE 20 MG PO TABS
20.0000 mg | ORAL_TABLET | Freq: Every day | ORAL | Status: DC
  Administered 2022-05-02: 20 mg via ORAL
  Filled 2022-05-01: qty 1

## 2022-05-01 MED ORDER — ONDANSETRON HCL 4 MG/2ML IJ SOLN
4.0000 mg | Freq: Once | INTRAMUSCULAR | Status: AC
Start: 2022-05-01 — End: 2022-05-01
  Administered 2022-05-01: 4 mg via INTRAVENOUS
  Filled 2022-05-01: qty 2

## 2022-05-01 MED ORDER — SODIUM CHLORIDE 0.9 % IV BOLUS
1000.0000 mL | Freq: Once | INTRAVENOUS | Status: AC
  Administered 2022-05-01: 1000 mL via INTRAVENOUS

## 2022-05-01 MED ORDER — HYDRALAZINE HCL 20 MG/ML IJ SOLN
10.0000 mg | INTRAMUSCULAR | Status: DC | PRN
  Filled 2022-05-01: qty 1

## 2022-05-01 MED ORDER — DIAZEPAM 5 MG PO TABS: 5.0000 mg | ORAL_TABLET | Freq: Four times a day (QID) | ORAL | Status: DC | PRN

## 2022-05-01 MED ORDER — HYDROMORPHONE HCL 1 MG/ML IJ SOLN
0.5000 mg | INTRAMUSCULAR | Status: DC | PRN
  Administered 2022-05-01 – 2022-05-03 (×8): 1 mg via INTRAVENOUS
  Filled 2022-05-01 (×8): qty 1

## 2022-05-01 MED ORDER — POTASSIUM CHLORIDE IN NACL 20-0.9 MEQ/L-% IV SOLN
INTRAVENOUS | Status: DC
Start: 2022-05-01 — End: 2022-05-02
  Filled 2022-05-01 (×3): qty 1000

## 2022-05-01 MED ORDER — POTASSIUM CHLORIDE 10 MEQ/100ML IV SOLN
10.0000 meq | INTRAVENOUS | Status: AC
  Administered 2022-05-01 – 2022-05-02 (×4): 10 meq via INTRAVENOUS
  Filled 2022-05-01 (×4): qty 100

## 2022-05-01 MED ORDER — IOHEXOL 350 MG/ML SOLN
100.0000 mL | Freq: Once | INTRAVENOUS | Status: AC | PRN
  Administered 2022-05-01: 100 mL via INTRAVENOUS

## 2022-05-01 MED ORDER — ONDANSETRON HCL 4 MG/2ML IJ SOLN: 4.0000 mg | Freq: Four times a day (QID) | INTRAMUSCULAR | Status: DC | PRN

## 2022-05-01 MED ORDER — CARBIDOPA-LEVODOPA 25-100 MG PO TABS
1.0000 | ORAL_TABLET | Freq: Three times a day (TID) | ORAL | Status: DC
Start: 2022-05-01 — End: 2022-05-05
  Administered 2022-05-01 – 2022-05-05 (×11): 1 via ORAL
  Filled 2022-05-01 (×11): qty 1

## 2022-05-01 MED ORDER — CLOPIDOGREL BISULFATE 75 MG PO TABS
75.0000 mg | ORAL_TABLET | Freq: Every day | ORAL | Status: DC
  Administered 2022-05-01 – 2022-05-05 (×5): 75 mg via ORAL
  Filled 2022-05-01 (×5): qty 1

## 2022-05-01 MED ORDER — FENTANYL CITRATE PF 50 MCG/ML IJ SOSY: 100.0000 ug | PREFILLED_SYRINGE | Freq: Once | INTRAMUSCULAR | Status: AC

## 2022-05-01 MED ORDER — LABETALOL HCL 5 MG/ML IV SOLN
20.0000 mg | Freq: Once | INTRAVENOUS | Status: AC
  Administered 2022-05-01: 20 mg via INTRAVENOUS
  Filled 2022-05-01: qty 4

## 2022-05-01 NOTE — ED Provider Notes (Signed)
[facility] EMERGENCY DEPARTMENT Provider Note   CSN: [id number] Arrival date & time: 04/21/2022  1332     History  Chief Complaint  Patient presents with   Back Pain    Russell Hebert is a 76 y.o. 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  Patient arrives by EMS.  He is a poor historian, is moaning in pain, shouting that his abdomen hurts and he is having pain in his back.  It is worse with movement.  He claims this started this morning, and feels it is different than his typical back pain.  External records reviewed, Dec 2022 MRI Lumbar spine with L2 compression fx 30-40%, T12 endplate fx.    HPI     Home Medications Prior to Admission medications   Medication Sig Start Date End Date Taking? Authorizing Provider  atorvastatin (LIPITOR) 40 MG tablet Take 40 mg by mouth daily. 02/19/22  Yes [provider]  carvedilol (COREG) 3.125 MG tablet Take 3.125 mg by mouth 2 (two) times daily. 02/27/22  Yes [provider]  carbidopa-levodopa (SINEMET CR) 50-200 MG tablet Take 1 tablet by mouth at bedtime. Patient not taking: Reported on 04/12/2022 11/14/21   Tat, Eustace Quail, DO  carbidopa-levodopa (SINEMET IR) 25-100 MG tablet TAKE 1 TABLET THREE TIMES DAILY AT 7AM, 11AM AND 4PM Patient taking differently: Take 1 tablet by mouth 3 (three) times daily. 7am, 11am, and 4pm 10/30/21   Tat, Eustace Quail, DO  clopidogrel (PLAVIX) 75 MG tablet Take 75 mg by mouth daily.    [provider]  diazepam (VALIUM) 5 MG tablet Take 5 mg by mouth every 6 (six) hours as needed for anxiety.    [provider]  gabapentin (NEURONTIN) 100 MG capsule Take 200 mg by mouth 2 (two) times daily.    [provider]  HYDROcodone-acetaminophen (NORCO/VICODIN) 5-325 MG tablet Take 1 tablet by mouth every 4 (four) hours as needed. 11/03/21    [provider]  omeprazole (PRILOSEC) 20 MG capsule Take 20 mg by mouth daily.    [provider]  predniSONE (DELTASONE) 20 MG tablet 2 tablet 10/29/21   [provider]  simvastatin (ZOCOR) 40 MG tablet Take 40 mg by mouth at bedtime.    [provider]  traMADol (ULTRAM) 50 MG tablet Take by mouth every 6 (six) hours as needed.    [provider]      Allergies    Morphine and Celebrex [celecoxib]    Review of Systems   Review of Systems  Physical Exam Updated Vital Signs BP 130/75   Pulse 85   Temp 97.8 F (36.6 C) (Oral)   Resp 18   SpO2 95%  Physical Exam Constitutional:      General: He is in acute distress.  HENT:     Head: Normocephalic and atraumatic.  Eyes:     Conjunctiva/sclera: Conjunctivae normal.     Pupils: Pupils are equal, round, and reactive to light.  Cardiovascular:     Rate and Rhythm: Regular rhythm.     Pulses: Normal pulses.     Comments: 30 pedal pulses but equal bilaterally, no discoloration of the toes Pulmonary:     Effort: Pulmonary effort is normal. No respiratory distress.  Abdominal:     General: There is no distension.     Tenderness: There is no abdominal tenderness.  Comments: No pulsatile abdominal mass  Skin:    General: Skin is warm and dry.  Neurological:     Mental Status: He is alert.     Comments: Patient reporting diminished sensation of bilateral feet    ED Results / Procedures / Treatments   Labs (all labs ordered are listed, but only abnormal results are displayed) Labs Reviewed  BASIC METABOLIC PANEL - Abnormal; Notable for the following components:      Result Value   Potassium 3.3 (*)    CO2 18 (*)    Glucose, Bld 188 (*)    All other components within normal limits  CBC WITH DIFFERENTIAL/PLATELET - Abnormal; Notable for the following components:   WBC 15.5 (*)    Platelets 447 (*)    Neutro Abs 14.0 (*)    All other components within normal limits   URINALYSIS, ROUTINE W REFLEX MICROSCOPIC - Abnormal; Notable for the following components:   Specific Gravity, Urine >1.046 (*)    Ketones, ur 5 (*)    All other components within normal limits  I-STAT CHEM 8, ED - Abnormal; Notable for the following components:   Potassium 3.2 (*)    Glucose, Bld 183 (*)    Calcium, Ion 1.02 (*)    TCO2 17 (*)    All other components within normal limits  TRIGLYCERIDES  LIPASE, BLOOD  CBC  CREATININE, SERUM  MAGNESIUM  COMPREHENSIVE METABOLIC PANEL  CBC    EKG EKG Interpretation  Date/Time:  Tuesday May 01 2022 14:18:31 EDT Ventricular Rate:  58 PR Interval:  208 QRS Duration: 119 QT Interval:  498 QTC Calculation: 490 R Axis:   38 Text Interpretation: Sinus rhythm Nonspecific intraventricular conduction delay Borderline repolarization abnormality Confirmed by Regan Lemming ([id number]) on 04/15/2022 4:47:02 PM  Radiology CT Angio Chest/Abd/Pel for Dissection W and/or Wo Contrast  Result Date: 04/15/2022 CLINICAL DATA:  Abdominal pain. History of abdominal aortic aneurysm. EXAM: CT ANGIOGRAPHY CHEST, ABDOMEN AND PELVIS TECHNIQUE: Non-contrast CT of the chest was initially obtained. Multidetector CT imaging through the chest, abdomen and pelvis was performed using the standard protocol during bolus administration of intravenous contrast. Multiplanar reconstructed images and MIPs were obtained and reviewed to evaluate the vascular anatomy. RADIATION DOSE REDUCTION: This exam was performed according to the departmental dose-optimization program which includes automated exposure control, adjustment of the mA and/or kV according to patient size and/or use of iterative reconstruction technique. CONTRAST:  142m OMNIPAQUE IOHEXOL 350 MG/ML SOLN COMPARISON:  None Available. FINDINGS: CTA CHEST FINDINGS Cardiovascular:  No intramural hematoma on noncontrast imaging. Aortic atherosclerosis. Tortuous descending thoracic aorta. Mild ascending aortic aneurysm,  including at 4.2 cm on 37/6 and coronal image 53. Ulcerative plaque in the descending thoracic aorta. Mild cardiomegaly with prior median sternotomy for CABG. No central pulmonary embolism, on this non-dedicated study. Mediastinum/Nodes: No mediastinal or hilar adenopathy. A large hiatal hernia, with greater than 1/2 of the stomach positioned in the lower chest. The upper esophagus is moderately dilated with fluid level within on 30/8. Edema adjacent the intrathoracic stomach including on 101/8 likely tracks from the abdominal process. Lungs/Pleura: Trace right pleural fluid. 3 mm nodule along the right minor fissure is most likely a subpleural lymph node on 80/9. Musculoskeletal: No acute osseous abnormality. Remote anterior left rib fractures. Moderate T12 compression deformity without ventral canal encroachment. Review of the MIP images confirms the above findings. CTA ABDOMEN AND PELVIS FINDINGS VASCULAR Aorta: Atherosclerotic irregularity within. Maximal caliber infrarenal aorta of 3.1 x 2.7 cm.  Celiac: Widely patent. SMA: Widely patent with atherosclerotic irregularity within. Renals: Atherosclerotic irregularity within both renal arteries, without significant stenosis. IMA: Patent Inflow: No significant stenosis. Veins: Not well evaluated Review of the MIP images confirms the above findings. NON-VASCULAR Hepatobiliary: Normal liver. Normal gallbladder, without biliary ductal dilatation. Pancreas: Moderate peripancreatic edema is relatively diffuse including on 140/8. No evidence of pancreatic necrosis or duct dilatation. No well-circumscribed peripancreatic fluid collection. Spleen: Normal in size, without focal abnormality. Adrenals/Urinary Tract: Normal adrenal glands. Normal kidneys, without hydronephrosis. Normal urinary bladder. Stomach/Bowel: Edema also identified about the intraabdominal stomach and proximal duodenum. Scattered colonic diverticula. Normal terminal ileum and appendix. Normal small  bowel. Lymphatic: No abdominopelvic adenopathy. Reproductive: Mild prostatomegaly. Other: Small fat containing left inguinal hernia. No significant free fluid. No free intraperitoneal air. Musculoskeletal: Osteopenia. L1 and L2 mild-to-moderate compression deformities. Review of the MIP images confirms the above findings. IMPRESSION: 1. Moderate non complicated pancreatitis. 2. Ascending aortic aneurysm on the order of 4.2 cm. Recommend annual imaging followup by CTA or MRA. This recommendation follows 2010 ACCF/AHA/AATS/ACR/ASA/SCA/SCAI/SIR/STS/SVM Guidelines for the Diagnosis and Management of Patients with Thoracic Aortic Disease. Circulation. 2010; 121: F751-W258. Aortic aneurysm NOS (ICD10-I71.9) 3. Large hiatal hernia. Esophageal air fluid level suggests dysmotility or gastroesophageal reflux. 4. Edema adjacent the thoracoabdominal stomach and duodenum is likely secondary to pancreatitis. 5. Tiny right pleural effusion. 6. Osteopenia with thoracolumbar compression deformities. Electronically Signed   By: Abigail Miyamoto M.D.   On: 04/11/2022 15:00   US Abdomen Limited RUQ (LIVER/GB)  Result Date: 04/08/2022 CLINICAL DATA:  Pancreatitis EXAM: ULTRASOUND ABDOMEN LIMITED RIGHT UPPER QUADRANT COMPARISON:  04/18/2022 FINDINGS: Gallbladder: Multiple mobile gallstones are present the gallbladder, largest 1.6 cm. Borderline gallbladder wall thickening and 0.3 cm. Sonographic Murphy's sign absent. Common bile duct: Diameter: 0.5 cm Liver: No focal lesion identified. Within normal limits in parenchymal echogenicity. Portal vein is patent on color Doppler imaging with normal direction of blood flow towards the liver. Other: None. IMPRESSION: 1. Cholelithiasis. Borderline gallbladder wall thickening. Sonographic Murphy's sign absent. 2. No directly visualized choledocholithiasis. No biliary dilatation. Electronically Signed   By: Van Clines M.D.   On: 04/12/2022 18:18    Procedures Procedures    Medications  Ordered in ED Medications  diazepam (VALIUM) tablet 5 mg (has no administration in time range)  predniSONE (DELTASONE) tablet 20 mg (has no administration in time range)  clopidogrel (PLAVIX) tablet 75 mg (has no administration in time range)  carbidopa-levodopa (SINEMET CR) 50-200 MG per tablet controlled release 1 tablet (has no administration in time range)  carbidopa-levodopa (SINEMET IR) 25-100 MG per tablet immediate release 1 tablet (has no administration in time range)  enoxaparin (LOVENOX) injection 40 mg (has no administration in time range)  0.9 % NaCl with KCl 20 mEq/ L  infusion (has no administration in time range)  acetaminophen (TYLENOL) tablet 650 mg (has no administration in time range)    Or  acetaminophen (TYLENOL) suppository 650 mg (has no administration in time range)  HYDROmorphone (DILAUDID) injection 0.5-1 mg (has no administration in time range)  ondansetron (ZOFRAN) tablet 4 mg (has no administration in time range)    Or  ondansetron (ZOFRAN) injection 4 mg (has no administration in time range)  hydrALAZINE (APRESOLINE) injection 10 mg (has no administration in time range)  pantoprazole (PROTONIX) injection 40 mg (has no administration in time range)  potassium chloride 10 mEq in 100 mL IVPB (has no administration in time range)  fentaNYL (SUBLIMAZE) injection 100 mcg (100 mcg  Intravenous Given 04/05/2022 1413)  ondansetron (ZOFRAN) injection 4 mg (4 mg Intravenous Given 04/13/2022 1416)  iohexol (OMNIPAQUE) 350 MG/ML injection 100 mL (100 mLs Intravenous Contrast Given 04/30/2022 1432)  sodium chloride 0.9 % bolus 1,000 mL (0 mLs Intravenous Stopped 04/30/2022 1605)  HYDROmorphone (DILAUDID) injection 1 mg (1 mg Intravenous Given 04/22/2022 1527)  HYDROmorphone (DILAUDID) injection 1 mg (1 mg Intravenous Given 04/04/2022 1722)  labetalol (NORMODYNE) injection 20 mg (20 mg Intravenous Given 04/22/2022 1722)    ED Course/ Medical Decision Making/ A&P                            Medical Decision Making Amount and/or Complexity of Data Reviewed Labs: ordered. Radiology: ordered. ECG/medicine tests: ordered.  Risk Prescription drug management. Decision regarding hospitalization.   This patient presents to the ED with concern for abdominal pain and back pain, abrupt onset. This involves an extensive number of treatment options, and is a complaint that carries with it a high risk of complications and morbidity.  The differential diagnosis includes AAA complication versus UTI versus kidney stone versus spinal pain or pathology versus other  Co-morbidities that complicate the patient evaluation: History of significant degenerative bone disease and spinal fractures, history of AAA, history of Parkinson's dementia  Additional history obtained from EMS on arrival  External records from outside source obtained and reviewed including MRI of the lumbar spine as noted above  I ordered and personally interpreted labs.  The pertinent results include:  WBC 15.5, lipase pending  I ordered imaging studies including emergent CT imaging of the chest abdomen pelvis for dissection rule I independently visualized and interpreted imaging which showed acute pancreatitis, no sig GB abnormalities I agree with the radiologist interpretation  The patient was maintained on a cardiac monitor.  I personally viewed and interpreted the cardiac monitored which showed an underlying rhythm of: sinus tachycardia  Per my interpretation the patient's ECG shows sinus rhythm  I ordered medication including IV pain meds and fluids  Test Considered: With acute pancreatitis finding, lower suspicion for spinal injury or fx - did not feel MRI indicated at this time   After the interventions noted above, I reevaluated the patient and found that they have: improved - pain improved  Dispostion:  After consideration of the diagnostic results and the patients response to treatment, I feel that the  patent would benefit from medical admission.         Final Clinical Impression(s) / ED Diagnoses Final diagnoses:  Acute pancreatitis, unspecified complication status, unspecified pancreatitis type    Rx / DC Orders ED Discharge Orders     None         Wyvonnia Dusky, MD 04/03/2022 [id number]

## 2022-05-01 NOTE — ED Notes (Signed)
ED Provider at bedside. 

## 2022-05-01 NOTE — Progress Notes (Signed)
Pt arrived on the unit aox4, skin intact wears upper and lower dentures but not at bedside. S1s2 audible and breathing regular unlabored

## 2022-05-01 NOTE — ED Triage Notes (Signed)
Via EMS from home. Pt rpeorts severe lower back pain that started this morning. Pt denies fall or trauma. GCS 15. Pt pallor.

## 2022-05-01 NOTE — H&P (Addendum)
History and Physical    Russell Hebert JOI:[id number] DOB: 09-19-1946 DOA: 04/29/2022  PCP: Alroy Dust, L.Marlou Sa, MD  Patient coming from: Home  I have personally briefly reviewed patient's old medical records in [place]  Chief Complaint: Back pain/abdominal pain  HPI: Russell Hebert is a 76 y.o. male with medical history significant of parkinsonian syndrome, GERD, hypertension, CAD s/p CABG, history of fall in December resulting in lumbar spine fracture, presented to ED with a complaint of back pain.  Currently patient, he was well and in good health when he slept last night but he when he woke up this morning, he started having back pain which was radiating to the front/abdomen.  The pain was immediately very intense 10 out of 10, crampy and dull interchanging, with no aggravating or relieving factor.  He also had nausea as well as few episodes of vomiting.  No fever, chills, sweating.  No chest pain or shortness of breath.  No other complaint.  No recent sick contact or recent travel.  Patient endorses drinking almost 3-4 beers 2-3 times a week but last time he had a beer was 4 days ago.  Per patient, he has been having back pain since he fell in December but he is mobile with using a walker and he lives by himself.  This pain is much worse than his usual back pain.  ED Course: Upon arrival to ED, patient was hypertensive, he underwent CTA chest abdomen and pelvis which showed pancreatitis as well as ascending aortic aneurysm of 4.2 cm.  No dissection.  Patient was given pain medications.  Hospital service were consulted for admission.  Review of Systems: As per HPI otherwise negative.    Past Medical History:  Diagnosis Date   Carotid artery disease ([place])    Doppler, December, 2010, 0-39% bilateral   Coronary artery disease    Acute lateral MI October, 2008, bare-metal stent circumflex /  catheterization November, 2009, LIMA-LAD patent,, all vein grafts patent OM stent patent...  some sluggish distal flow... 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     reports that he has never smoked. He has never used smokeless tobacco. He reports current alcohol use. He reports that he does not use drugs.  Allergies  Allergen Reactions   Morphine Anaphylaxis   Celebrex [Celecoxib]     diarrhea    Family History  Problem Relation Age of Onset   Heart disease Mother    Heart failure Mother    Heart failure Father     Prior to Admission medications   Medication Sig Start Date End Date Taking? Authorizing Provider  carbidopa-levodopa (SINEMET CR) 50-200 MG tablet Take 1 tablet by mouth at bedtime. 11/14/21   Tat, Eustace Quail, DO  carbidopa-levodopa (SINEMET IR) 25-100 MG tablet TAKE 1 TABLET THREE TIMES DAILY AT 7AM, 11AM AND 4PM 10/30/21   Tat, Eustace Quail, DO  clopidogrel (PLAVIX) 75 MG tablet  Take 75 mg by mouth daily.    [provider]  diazepam (VALIUM) 5 MG tablet Take 5 mg by mouth every 6 (six) hours as needed for anxiety.    [provider]  gabapentin (NEURONTIN) 100 MG capsule Take 200 mg by mouth 2 (two) times daily.    [provider]  HYDROcodone-acetaminophen (NORCO/VICODIN) 5-325 MG tablet Take 1 tablet by mouth every 4 (four) hours as needed. 11/03/21   [provider]  omeprazole (PRILOSEC) 20 MG capsule Take 20 mg by mouth daily.    [provider]  predniSONE (DELTASONE) 20 MG tablet 2 tablet  10/29/21   [provider]  simvastatin (ZOCOR) 40 MG tablet Take 40 mg by mouth at bedtime.    [provider]  traMADol (ULTRAM) 50 MG tablet Take by mouth every 6 (six) hours as needed.    [provider]    Physical Exam: Vitals:   04/16/2022 1440 04/04/2022 1530 04/25/2022 1630 04/21/2022 1700  BP: (!) 157/94 (!) 187/83 (!) 189/87 (!) 187/106  Pulse: 64 73 68 79  Resp: (!) 25 (!) 30 19 (!) 26  Temp:      TempSrc:      SpO2: 100% 99% 99% 97%    Constitutional: NAD, calm, comfortable Vitals:   05/02/2022 1440 04/06/2022 1530 04/17/2022 1630 04/02/2022 1700  BP: (!) 157/94 (!) 187/83 (!) 189/87 (!) 187/106  Pulse: 64 73 68 79  Resp: (!) 25 (!) 30 19 (!) 26  Temp:      TempSrc:      SpO2: 100% 99% 99% 97%   Eyes: PERRL, lids and conjunctivae normal ENMT: Mucous membranes are moist. Posterior pharynx clear of any exudate or lesions.Normal dentition.  Neck: normal, supple, no masses, no thyromegaly Respiratory: clear to auscultation bilaterally, no wheezing, no crackles. Normal respiratory effort. No accessory muscle use.  Cardiovascular: Regular rate and rhythm, no murmurs / rubs / gallops. No extremity edema. 2+ pedal pulses. No carotid bruits.  Abdomen: Severe generalized tenderness, more pronounced at epigastric area no masses palpated. No hepatosplenomegaly. Bowel sounds positive.  Musculoskeletal: no clubbing / cyanosis. No joint deformity upper and lower extremities. Good ROM, no contractures. Normal muscle tone.  Skin: no rashes, lesions, ulcers. No induration Neurologic: CN 2-12 grossly intact. Sensation intact, DTR normal. Strength 5/5 in all 4.   Labs on Admission: I have personally reviewed following labs and imaging studies  CBC: Recent Labs  Lab 04/24/2022 1352 04/11/2022 1416  WBC 15.5*  --   NEUTROABS 14.0*  --   HGB 13.4 14.3  HCT 43.1 42.0  MCV 92.3  --   PLT 447*  --    Basic Metabolic Panel: Recent Labs  Lab 04/28/2022 1352 04/13/2022 1416   NA 140 141  K 3.3* 3.2*  CL 108 111  CO2 18*  --   GLUCOSE 188* 183*  BUN 17 17  CREATININE 1.07 0.80  CALCIUM 9.1  --    GFR: CrCl cannot be calculated (Unknown ideal weight.). Liver Function Tests: No results for input(s): AST, ALT, ALKPHOS, BILITOT, PROT, ALBUMIN in the last 168 hours. No results for input(s): LIPASE, AMYLASE in the last 168 hours. No results for input(s): AMMONIA in the last 168 hours. Coagulation Profile: No results for input(s): INR, PROTIME in the last 168 hours. Cardiac Enzymes: No results for input(s): CKTOTAL, CKMB, CKMBINDEX, TROPONINI in the last 168 hours. BNP (last 3 results) No results for input(s): PROBNP in the last 8760 hours. HbA1C: No  results for input(s): HGBA1C in the last 72 hours. CBG: No results for input(s): GLUCAP in the last 168 hours. Lipid Profile: No results for input(s): CHOL, HDL, LDLCALC, TRIG, CHOLHDL, LDLDIRECT in the last 72 hours. Thyroid Function Tests: No results for input(s): TSH, T4TOTAL, FREET4, T3FREE, THYROIDAB in the last 72 hours. Anemia Panel: No results for input(s): VITAMINB12, FOLATE, FERRITIN, TIBC, IRON, RETICCTPCT in the last 72 hours. Urine analysis:    Component Value Date/Time   COLORURINE YELLOW 04/25/2022 1650   APPEARANCEUR CLEAR 04/05/2022 1650   LABSPEC >1.046 (H) 04/24/2022 1650   PHURINE 5.0 04/12/2022 1650   GLUCOSEU NEGATIVE 04/06/2022 1650   HGBUR NEGATIVE 04/18/2022 1650   BILIRUBINUR NEGATIVE 04/26/2022 1650   KETONESUR 5 (A) 04/04/2022 1650   PROTEINUR NEGATIVE 04/26/2022 1650   NITRITE NEGATIVE 04/25/2022 1650   LEUKOCYTESUR NEGATIVE 04/07/2022 1650    Radiological Exams on Admission: CT Angio Chest/Abd/Pel for Dissection W and/or Wo Contrast  Result Date: 04/04/2022 CLINICAL DATA:  Abdominal pain. History of abdominal aortic aneurysm. EXAM: CT ANGIOGRAPHY CHEST, ABDOMEN AND PELVIS TECHNIQUE: Non-contrast CT of the chest was initially obtained. Multidetector CT imaging through  the chest, abdomen and pelvis was performed using the standard protocol during bolus administration of intravenous contrast. Multiplanar reconstructed images and MIPs were obtained and reviewed to evaluate the vascular anatomy. RADIATION DOSE REDUCTION: This exam was performed according to the departmental dose-optimization program which includes automated exposure control, adjustment of the mA and/or kV according to patient size and/or use of iterative reconstruction technique. CONTRAST:  172m OMNIPAQUE IOHEXOL 350 MG/ML SOLN COMPARISON:  None Available. FINDINGS: CTA CHEST FINDINGS Cardiovascular:  No intramural hematoma on noncontrast imaging. Aortic atherosclerosis. Tortuous descending thoracic aorta. Mild ascending aortic aneurysm, including at 4.2 cm on 37/6 and coronal image 53. Ulcerative plaque in the descending thoracic aorta. Mild cardiomegaly with prior median sternotomy for CABG. No central pulmonary embolism, on this non-dedicated study. Mediastinum/Nodes: No mediastinal or hilar adenopathy. A large hiatal hernia, with greater than 1/2 of the stomach positioned in the lower chest. The upper esophagus is moderately dilated with fluid level within on 30/8. Edema adjacent the intrathoracic stomach including on 101/8 likely tracks from the abdominal process. Lungs/Pleura: Trace right pleural fluid. 3 mm nodule along the right minor fissure is most likely a subpleural lymph node on 80/9. Musculoskeletal: No acute osseous abnormality. Remote anterior left rib fractures. Moderate T12 compression deformity without ventral canal encroachment. Review of the MIP images confirms the above findings. CTA ABDOMEN AND PELVIS FINDINGS VASCULAR Aorta: Atherosclerotic irregularity within. Maximal caliber infrarenal aorta of 3.1 x 2.7 cm. Celiac: Widely patent. SMA: Widely patent with atherosclerotic irregularity within. Renals: Atherosclerotic irregularity within both renal arteries, without significant stenosis. IMA:  Patent Inflow: No significant stenosis. Veins: Not well evaluated Review of the MIP images confirms the above findings. NON-VASCULAR Hepatobiliary: Normal liver. Normal gallbladder, without biliary ductal dilatation. Pancreas: Moderate peripancreatic edema is relatively diffuse including on 140/8. No evidence of pancreatic necrosis or duct dilatation. No well-circumscribed peripancreatic fluid collection. Spleen: Normal in size, without focal abnormality. Adrenals/Urinary Tract: Normal adrenal glands. Normal kidneys, without hydronephrosis. Normal urinary bladder. Stomach/Bowel: Edema also identified about the intraabdominal stomach and proximal duodenum. Scattered colonic diverticula. Normal terminal ileum and appendix. Normal small bowel. Lymphatic: No abdominopelvic adenopathy. Reproductive: Mild prostatomegaly. Other: Small fat containing left inguinal hernia. No significant free fluid. No free intraperitoneal air. Musculoskeletal: Osteopenia. L1 and L2 mild-to-moderate compression deformities. Review of the MIP images confirms the above findings. IMPRESSION:  1. Moderate non complicated pancreatitis. 2. Ascending aortic aneurysm on the order of 4.2 cm. Recommend annual imaging followup by CTA or MRA. This recommendation follows 2010 ACCF/AHA/AATS/ACR/ASA/SCA/SCAI/SIR/STS/SVM Guidelines for the Diagnosis and Management of Patients with Thoracic Aortic Disease. Circulation. 2010; 121: H299-M426. Aortic aneurysm NOS (ICD10-I71.9) 3. Large hiatal hernia. Esophageal air fluid level suggests dysmotility or gastroesophageal reflux. 4. Edema adjacent the thoracoabdominal stomach and duodenum is likely secondary to pancreatitis. 5. Tiny right pleural effusion. 6. Osteopenia with thoracolumbar compression deformities. Electronically Signed   By: Abigail Miyamoto M.D.   On: 04/29/2022 15:00    EKG: Independently reviewed.  Sinus rhythm with no acute ST-T wave changes  Assessment/Plan Principal Problem:   Acute  pancreatitis Active Problems:   Hypertension   GERD (gastroesophageal reflux disease)   Hx of CABG   Carotid artery disease (HCC)   Parkinsonian syndrome (HCC)   Hypertensive urgency   Hypokalemia   Leukocytosis   Ascending aortic aneurysm (HCC)   Acute pancreatitis: History not very impressive of alcoholic pancreatitis since he does not drink much and last drink was 4 days ago and symptoms only started this morning.  No gallstones seen on the CT scan however I will proceed with ultrasound right upper quadrant.  We manage conservatively with pain control and keep him n.p.o. with as needed IV Dilaudid as well as IV fluids.  Follow-up on triglyceride and lipase level.  Hypertensive urgency: In the ER, patient's highest systolic blood pressure has been 187 and highest diastolic 834.  Meeting criteria for hypertensive urgency.  Patient tells me that he takes medications for hypertension but does not recall the names or the dosage.  I have advised ED physician to provide him with labetalol which she is going to get.  I am going to start him on as needed hydralazine until our pharmacy can figure out his PTA antihypertensive medications.  Leukocytosis: Nonspecific and reactive in the setting of acute pancreatitis.  Hopefully this will resolve with IV fluids.  Ascending aortic aneurysm on the order of 4.2 cm. Recommend annual imaging followup by CTA or MRA. This recommendation follows 2010 ACCF/AHA/AATS/ACR/ASA/SCA/SCAI/SIR/STS/SVM Guidelines for the Diagnosis and Management of Patients with Thoracic Aortic Disease. Circulation. 2010; 121: H962-I297. Aortic aneurysm NOS (ICD10-I71.9)  Hypokalemia: Will replace.  CAD s/p CABG: Resume Plavix.  Asymptomatic.  GERD: We will start on Protonix.  Parkinsonian syndrome: Resume home medications.  DVT prophylaxis: enoxaparin (LOVENOX) injection 40 mg Start: 04/21/2022 1745 Code Status: DNR-patient was very clear that he has a will made which has DNR  listed and his daughter is [facility] Family Communication: None present at bedside.  Plan of care discussed with patient in length and he verbalized understanding and agreed with it. Disposition Plan: Potentially home after 2 midnight stays. Consults called: None  Darliss Cheney MD [facility]  *Please note that this is a verbal dictation therefore any spelling or grammatical errors are due to the "[place]" system interpretation.  Please page via [place] and do not message via secure chat for urgent patient care matters. Secure chat can be used for non urgent patient care matters. 04/30/2022, 5:45 PM  To contact the attending provider between 7A-7P or the covering provider during after hours 7P-7A, please log into the web site [email or web address]

## 2022-05-01 NOTE — ED Notes (Signed)
Arbie Cookey daughter  [phone]

## 2022-05-01 NOTE — ED Provider Notes (Signed)
Given the patient's clinical presentation which is highly concerning for AAA/dissection, I have called radiology to request waiving creatinine check in favor of emergent IV scan.   Wyvonnia Dusky, MD 04/05/2022 1409

## 2022-05-01 NOTE — ED Provider Notes (Signed)
  Physical Exam  BP (!) 157/94 (BP Location: Left Arm)   Pulse 64   Temp 97.8 F (36.6 C) (Oral)   Resp (!) 25   SpO2 100%   Physical Exam  Procedures  Procedures  ED Course / MDM    Medical Decision Making Amount and/or Complexity of Data Reviewed Labs: ordered. Radiology: ordered. ECG/medicine tests: ordered.  Risk Prescription drug management. Decision regarding hospitalization.   [name], presenting with abd pain radiating to back. Hx of aneurysm. Got CTA which shows no dissection or ruptured aneurysm, does have pancreatitis. Needs admission.  Patient has epigastric tenderness on exam with associated nausea.  He is hypertensive.  20 mg of labetalol was administered in addition to IV Dilaudid for pain control.  Findings are concerning for pancreatitis. IMPRESSION:  1. Moderate non complicated pancreatitis.  2. Ascending aortic aneurysm on the order of 4.2 cm. Recommend  annual imaging followup by CTA or MRA. This recommendation follows  2010 ACCF/AHA/AATS/ACR/ASA/SCA/SCAI/SIR/STS/SVM Guidelines for the  Diagnosis and Management of Patients with Thoracic Aortic Disease.  Circulation. 2010; 121: K122-E497. Aortic aneurysm NOS (ICD10-I71.9)  3. Large hiatal hernia. Esophageal air fluid level suggests  dysmotility or gastroesophageal reflux.  4. Edema adjacent the thoracoabdominal stomach and duodenum is  likely secondary to pancreatitis.  5. Tiny right pleural effusion.  6. Osteopenia with thoracolumbar compression deformities.   No evidence on CT imaging of cholelithiasis/cholecystitis.  Patient does endorse intermittent alcohol use.  We will send triglyceride level.  Lipase pending but patient has 2 of 3 findings for diagnosis of pancreatitis with epigastric tenderness on exam and CT findings.  Hospitalist medicine consulted for admission for further management.       Regan Lemming, MD 04/28/2022 1718

## 2022-05-01 NOTE — ED Notes (Signed)
Back from CT

## 2022-05-02 ENCOUNTER — Inpatient Hospital Stay (HOSPITAL_COMMUNITY): Payer: Medicare PPO

## 2022-05-02 DIAGNOSIS — R000 Tachycardia, unspecified: Secondary | ICD-10-CM

## 2022-05-02 DIAGNOSIS — F419 Anxiety disorder, unspecified: Secondary | ICD-10-CM

## 2022-05-02 DIAGNOSIS — K449 Diaphragmatic hernia without obstruction or gangrene: Secondary | ICD-10-CM

## 2022-05-02 DIAGNOSIS — E875 Hyperkalemia: Secondary | ICD-10-CM

## 2022-05-02 DIAGNOSIS — K859 Acute pancreatitis without necrosis or infection, unspecified: Secondary | ICD-10-CM | POA: Diagnosis not present

## 2022-05-02 DIAGNOSIS — R7989 Other specified abnormal findings of blood chemistry: Secondary | ICD-10-CM

## 2022-05-02 DIAGNOSIS — Z92241 Personal history of systemic steroid therapy: Secondary | ICD-10-CM

## 2022-05-02 DIAGNOSIS — E872 Acidosis, unspecified: Secondary | ICD-10-CM

## 2022-05-02 LAB — HEPATIC FUNCTION PANEL
ALT: 7 U/L (ref 0–44)
AST: 34 U/L (ref 15–41)
Albumin: 2.2 g/dL — ABNORMAL LOW (ref 3.5–5.0)
Alkaline Phosphatase: 83 U/L (ref 38–126)
Bilirubin, Direct: 0.4 mg/dL — ABNORMAL HIGH (ref 0.0–0.2)
Indirect Bilirubin: 0.6 mg/dL (ref 0.3–0.9)
Total Bilirubin: 1 mg/dL (ref 0.3–1.2)
Total Protein: 4.8 g/dL — ABNORMAL LOW (ref 6.5–8.1)

## 2022-05-02 LAB — URINALYSIS, ROUTINE W REFLEX MICROSCOPIC
Bilirubin Urine: NEGATIVE
Glucose, UA: NEGATIVE mg/dL
Hgb urine dipstick: NEGATIVE
Ketones, ur: NEGATIVE mg/dL
Leukocytes,Ua: NEGATIVE
Nitrite: NEGATIVE
Protein, ur: 30 mg/dL — AB
Specific Gravity, Urine: 1.038 — ABNORMAL HIGH (ref 1.005–1.030)
pH: 5 (ref 5.0–8.0)

## 2022-05-02 LAB — COMPREHENSIVE METABOLIC PANEL
ALT: 17 U/L (ref 0–44)
AST: 68 U/L — ABNORMAL HIGH (ref 15–41)
Albumin: 3 g/dL — ABNORMAL LOW (ref 3.5–5.0)
Alkaline Phosphatase: 158 U/L — ABNORMAL HIGH (ref 38–126)
Anion gap: 5 (ref 5–15)
BUN: 20 mg/dL (ref 8–23)
CO2: 20 mmol/L — ABNORMAL LOW (ref 22–32)
Calcium: 7.9 mg/dL — ABNORMAL LOW (ref 8.9–10.3)
Chloride: 116 mmol/L — ABNORMAL HIGH (ref 98–111)
Creatinine, Ser: 1.02 mg/dL (ref 0.61–1.24)
GFR, Estimated: >60 mL/min (ref >60)
Glucose, Bld: 154 mg/dL — ABNORMAL HIGH (ref 70–99)
Potassium: 5.4 mmol/L — ABNORMAL HIGH (ref 3.5–5.1)
Sodium: 141 mmol/L (ref 135–145)
Total Bilirubin: 0.5 mg/dL (ref 0.3–1.2)
Total Protein: 6 g/dL — ABNORMAL LOW (ref 6.5–8.1)

## 2022-05-02 LAB — CBC
HCT: 46.3 % (ref 39.0–52.0)
Hemoglobin: 14.9 g/dL (ref 13.0–17.0)
MCH: 29 pg (ref 26.0–34.0)
MCHC: 32.2 g/dL (ref 30.0–36.0)
MCV: 90.1 fL (ref 80.0–100.0)
Platelets: 488 10*3/uL — ABNORMAL HIGH (ref 150–400)
RBC: 5.14 MIL/uL (ref 4.22–5.81)
RDW: 14.4 % (ref 11.5–15.5)
WBC: 16.4 10*3/uL — ABNORMAL HIGH (ref 4.0–10.5)
nRBC: 0 % (ref 0.0–0.2)

## 2022-05-02 LAB — BASIC METABOLIC PANEL
Anion gap: 7 (ref 5–15)
BUN: 32 mg/dL — ABNORMAL HIGH (ref 8–23)
CO2: 16 mmol/L — ABNORMAL LOW (ref 22–32)
Calcium: 7.7 mg/dL — ABNORMAL LOW (ref 8.9–10.3)
Chloride: 121 mmol/L — ABNORMAL HIGH (ref 98–111)
Creatinine, Ser: 2.53 mg/dL — ABNORMAL HIGH (ref 0.61–1.24)
GFR, Estimated: 25 mL/min — ABNORMAL LOW (ref >60)
Glucose, Bld: 110 mg/dL — ABNORMAL HIGH (ref 70–99)
Potassium: 5.6 mmol/L — ABNORMAL HIGH (ref 3.5–5.1)
Sodium: 144 mmol/L (ref 135–145)

## 2022-05-02 LAB — GLUCOSE, CAPILLARY: Glucose-Capillary: 106 mg/dL — ABNORMAL HIGH (ref 70–99)

## 2022-05-02 MED ORDER — SODIUM ZIRCONIUM CYCLOSILICATE 10 G PO PACK
10.0000 g | PACK | Freq: Every day | ORAL | Status: AC
Start: 2022-05-02 — End: 2022-05-03
  Administered 2022-05-02 – 2022-05-03 (×2): 10 g via ORAL
  Filled 2022-05-02 (×2): qty 1

## 2022-05-02 MED ORDER — LACTATED RINGERS IV SOLN: INTRAVENOUS | Status: DC

## 2022-05-02 MED ORDER — SODIUM CHLORIDE 0.9 % IV SOLN: INTRAVENOUS | Status: DC

## 2022-05-02 MED ORDER — LACTATED RINGERS IV SOLN: INTRAVENOUS | Status: AC

## 2022-05-02 MED ORDER — POLYETHYLENE GLYCOL 3350 17 G PO PACK
17.0000 g | PACK | Freq: Every day | ORAL | Status: DC
  Administered 2022-05-03 – 2022-05-04 (×2): 17 g via ORAL
  Filled 2022-05-02 (×2): qty 1

## 2022-05-02 MED ORDER — DIAZEPAM 5 MG PO TABS
5.0000 mg | ORAL_TABLET | Freq: Every day | ORAL | Status: DC | PRN
Start: 2022-05-02 — End: 2022-05-05
  Administered 2022-05-03 – 2022-05-05 (×3): 5 mg via ORAL
  Filled 2022-05-02 (×3): qty 1

## 2022-05-02 MED ORDER — HYDROMORPHONE HCL 1 MG/ML IJ SOLN
1.0000 mg | Freq: Once | INTRAMUSCULAR | Status: AC | PRN
  Administered 2022-05-02: 1 mg via INTRAVENOUS
  Filled 2022-05-02: qty 1

## 2022-05-02 MED ORDER — SODIUM CHLORIDE 0.9 % IV BOLUS
500.0000 mL | Freq: Once | INTRAVENOUS | Status: AC | PRN
  Administered 2022-05-02: 500 mL via INTRAVENOUS

## 2022-05-02 NOTE — Assessment & Plan Note (Addendum)
resolved 

## 2022-05-02 NOTE — Assessment & Plan Note (Signed)
sinemet

## 2022-05-02 NOTE — Hospital Course (Addendum)
Russell Hebert is Russell Hebert 76 y.o. male with medical history significant of parkinsonian syndrome, GERD, hypertension, CAD s/p CABG, history of fall in December resulting in lumbar spine fracture, presented to ED with Russell Hebert complaint of back pain.  Lived independently and walked with walker prior to admission.  Drank 3-4 beers 2-3x Russell Hebert week, but last beer 4 days piror to admission.    CT with findings concerning for pancreatitis.  Hospital course complicated by delirium and uncontrolled pain.    GI consulted 6/2.  He's had recurrent fevers.  Pain is improved at this point.  He has cortrak for nutrition.  Repeat imaging notable for severe pancreatitis and bilateral effusions.  Currently on antibiotics for concern for pneumonia.  IR for thoracentesis 6/6.  See below for additional details

## 2022-05-02 NOTE — Assessment & Plan Note (Addendum)
S/p lokelma Follow, now hypo

## 2022-05-02 NOTE — Assessment & Plan Note (Addendum)
Elevated alk phos and ast, mild Improved, follow

## 2022-05-02 NOTE — Assessment & Plan Note (Addendum)
CT at presentation with moderate non complicated pancreatitis Repeat CT with progressive acute pancreatitis Given RUQ [place] with cholelithiasis, suspect this is most likely gallstone pancreatitis.  Etoh pancreatitis also on ddx (though he reported to admitting provider not drinking for several days prior to admission - will clarify when he's better able).  Normal triglycerides.  No obvious meds that stand out as cause. Holding further isotonic IVF with concern for his volume and resp status (ok for hypotonic fluids with hypernatremia) at this point with concerns for volume/effusions, diuresing as tolerated Abdominal exam Russell Hebert bit better today, still distended, but less pain on exam Repeat CT scan as above, severe acute pancreatitis cortrak placed 6/2 - tube feeds per RD, seems to be tolerating this well.  Dysphagia 1 diet, nectar thick liquids as tolerated.  Pain management, bowel regimen Appreciate GI recommendations - will discuss with general surgery once he's more stable for lap chole eventually, he's not stable for this now (see GI note from 6/5, now signed off).

## 2022-05-02 NOTE — Care Management (Signed)
  Transition of Care (TOC) Screening Note   Patient Details  Name: Russell Hebert Date of Birth: 1946/02/07   Transition of [facility]) CM/SW Contact:    Carles Collet, RN Phone Number: 05/02/2022, 10:28 AM    Transition of Care Department [facility]) has reviewed patient and no TOC needs have been identified at this time. We will continue to monitor patient advancement through interdisciplinary progression rounds. If new patient transition needs arise, please place a TOC consult.  Patient admitted from home, no current [facility] services noted in [employer].

## 2022-05-02 NOTE — Assessment & Plan Note (Signed)
Per med rec, he's no longer taking this, will d/c

## 2022-05-02 NOTE — Assessment & Plan Note (Addendum)
EKG with sinus tach, possible q in II, aVF appear chronic Seems likely related to pain from pancreatitis, respiratory status Now being diuresed as noted above Echo poor quality, normal EF Frequent PVC's, will start low dose metop - sinus tach, suspect cause pancreatitis/resp status, but given persistence will give Ziomara Birenbaum little beta blocker but will continue to treat underlying cause

## 2022-05-02 NOTE — Assessment & Plan Note (Signed)
Esophageal air fluid level suggests dysmotility or GERD PPI daily

## 2022-05-02 NOTE — Significant Event (Signed)
Rapid Response Event Note   Reason for Call :  Hypotension, decreased LOC  Pt received '1mg'$  dilaudid at 1915.   Initial Focused Assessment:  Py lying in bed with eyes open. He is staring off in space. With repeated verbal stimulation, he will track, follow commands, and move all extremities. Pupils are 1s. Per RN, he is alert and oriented and appropriate at baseline. Lungs clear t/o. Skin warm and diaphoretic.   T-98.7, HR-124, BP-80/58, RR-16, SpO2-94% on 2L [place].  Interventions:  CBG-106 500cc NS bolus  Plan of Care:  BP and MS improving with NS bolus. Continue to monitor pt closely. Call RRT if further assistance needed.   Event Summary:   MD Notified: Olena Heckle, NP Call [place] [id number]  Dillard Essex, RN

## 2022-05-02 NOTE — Progress Notes (Signed)
**Note De-Identified vi Obfusction** PROGRESS NOTE    Russell Hebert  PYP:[id number] DOB: 1946/03/31 DOA: 04/13/2022 PCP: Alroy Dust, L.Mrlou S, MD  Chief Complint  Ptient presents with   Bck Pin    Brief Nrrtive:  Russell Hebert is  76 y.o. mle with medicl history significnt of prkinsonin syndrome, GERD, hypertension, CAD s/p CABG, history of fll in December resulting in lumbr spine frcture, presented to ED with  complint of bck pin.  Currently ptient, he ws well nd in good helth when he slept lst night but he when he woke up this morning, he strted hving bck pin which ws rditing to the front/bdomen.  The pin ws immeditely very intense 10 out of 10, crmpy nd dull interchnging, with no ggrvting or relieving fctor.  He lso hd nuse s well s few episodes of vomiting.  No fever, chills, sweting.  No chest pin or shortness of breth.  No other complint.  No recent sick contct or recent trvel.  Ptient endorses drinking lmost 3-4 beers 2-3 times  week but lst time he hd  beer ws 4 dys go.  Per ptient, he hs been hving bck pin since he fell in December but he is mobile with using  wlker nd he lives by himself.  This pin is much worse thn his usul bck pin.   ED Course: Upon rrivl to ED, ptient ws hypertensive, he underwent CTA chest bdomen nd pelvis which showed pncretitis s well s scending ortic neurysm of 4.2 cm.  No dissection.  Ptient ws given pin medictions.  Hospitl service were consulted for dmission.      Assessment & Pln:   Principl Problem:   Pncretitis Active Problems:   Sinus tchycrdi   Hyperklemi   Metbolic cidosis   Elevted LFTs   Ascending ortic neurysm (HCC)   Hitl herni   Prkinsonism (HCC)   History of recent steroid use   Anxiety   Hypertension   GERD (gstroesophgel reflux disese)   Hx of CABG   Crotid rtery disese (HCC)   Hypertensive urgency   Hypoklemi   Leukocytosis   Assessment  nd Pln: * Pncretitis CT t presenttion with moderte non complicted pncretitis Given RUQ Kore with cholelithisis, suspect this is most likely gllstone pncretitis Hx drinking s well, but seems less likely bsed on hx.  Norml triglycerides.  No obvious meds tht stnd out s cuse. Aggressive IVF Pin mngement, bowel regimen Significnt pin tody, needing frequent diludid -> repet stt CT scn given lck of improvement throughout dy  Sinus tchycrdi Pending EKG Seems likely relted to pin Follow with IVF  Hyperklemi D/c IVF with KCL Repet PM BMP  Metbolic cidosis Mild, NAGMA.  Hyperchloremic, follow with LR  Elevted LFTs Elevted lk phos nd st, mild Follow   Ascending ortic neurysm (HCC) 4.2 cm, needs nnul follow up imging  Hitl herni Esophgel ir fluid level suggests dysmotility or GERD PPI dily  Prkinsonism ([place]) sinemet  History of recent steroid use Per med rec, he's no longer tking this, will d/c       DVT prophylxis: lovenox Code Sttus: DNR Fmily Communiction: none, clled dughter, no nswer Disposition:   Sttus is: Inptient Remins inptient pproprite becuse: dditionl w/u needed   Consultnts:  none  Procedures:  none  Antimicrobils:  Anti-infectives (From dmission, onwrd)    None       Subjective: C/o bdominl pin/bck pin  Objective: Vitls:   05/02/22 0436 05/02/22 0738 05/02/22 1644 05/02/22 1845  BP:  116/88  126/88 107/85  Pulse:  (!) 105 (!) 125 (!) 124  Resp:  _0 Temp:  98.2 F (36.8 C) 98.4 F (36.9 C) 100 F (37.8 C)  TempSrc:  Oral Oral Oral  SpO2:  95% 92% 97%  Weight: 71.8 kg       Intake/Output Summary (Last 24 hours) at 05/02/2022 1938 Last data filed at 05/02/2022 1650 Gross per 24 hour  Intake 2305.43 ml  Output 270 ml  Net 2035.43 ml   Filed Weights   05/02/22 0436  Weight: 71.8 kg    Examination:  General exam: ppears calm and  comfortable  Respiratory system: unlabored Cardiovascular system: RRR Gastrointestinal system: diffuse tenderness to palpation, appears significantly uncomfortable Central nervous system: lert, difficult to get to focus to interview due to abdominal discomfort. No focal neurological deficits. Extremities: no LEE    Data Reviewed: I have personally reviewed following labs and imaging studies  CBC: Recent Labs  Lab 04/19/2022 1352 04/08/2022 1416 05/02/22 0120  WBC 15.5*  --  16.4*  NEUTROBS 14.0*  --   --   HGB 13.4 14.3 14.9  HCT 43.1 42.0 46.3  MCV 92.3  --  90.1  PLT 447*  --  488*    Basic Metabolic Panel: Recent Labs  Lab 04/26/2022 1352 04/25/2022 1416 04/18/2022 1849 05/02/22 0120  N 140 141  --  141  K 3.3* 3.2*  --  5.4*  CL 108 111  --  116*  CO2 18*  --   --  20*  GLUCOSE 188* 183*  --  154*  BUN 17 17  --  20  CRETININE 1.07 0.80  --  1.02  CLCIUM 9.1  --   --  7.9*  MG  --   --  1.9  --     GFR: Estimated Creatinine Clearance: 56.7 mL/min (by C-G formula based on SCr of 1.02 mg/dL).  Liver Function Tests: Recent Labs  Lab 05/02/22 0120  ST 68*  LT 17  LKPHOS 158*  BILITOT 0.5  PROT 6.0*  LBUMIN 3.0*    CBG: No results for input(s): GLUCP in the last 168 hours.   No results found for this or any previous visit (from the past 240 hour(s)).       Radiology Studies: CT ngio Chest/bd/Pel for Dissection W and/or Wo Contrast  Result Date: 04/13/2022 CLINICL DT:  bdominal pain. History of abdominal aortic aneurysm. EXM: CT NGIOGRPHY CHEST, BDOMEN ND PELVIS TECHNIQUE: Non-contrast CT of the chest was initially obtained. Multidetector CT imaging through the chest, abdomen and pelvis was performed using the standard protocol during bolus administration of intravenous contrast. Multiplanar reconstructed images and MIPs were obtained and reviewed to evaluate the vascular anatomy. RDITION DOSE REDUCTION: This exam was performed  according to the departmental dose-optimization program which includes automated exposure control, adjustment of the m and/or kV according to patient size and/or use of iterative reconstruction technique. CONTRST:  180m OMNIPQUE IOHEXOL 350 MG/ML SOLN COMPRISON:  None vailable. FINDINGS: CT CHEST FINDINGS Cardiovascular:  No intramural hematoma on noncontrast imaging. ortic atherosclerosis. Tortuous descending thoracic aorta. Mild ascending aortic aneurysm, including at 4.2 cm on 37/6 and coronal image 53. Ulcerative plaque in the descending thoracic aorta. Mild cardiomegaly with prior median sternotomy for CBG. No central pulmonary embolism, on this non-dedicated study. Mediastinum/Nodes: No mediastinal or hilar adenopathy.  large hiatal hernia, with greater than 1/2 of the stomach positioned in the lower chest. The upper esophagus is moderately dilated with fluid **Note De-Identified vi Obfusction** level within on 30/8. Edem djcent the intrthorcic stomch including on 101/8 likely trcks from the bdominl process. Lungs/Pleur: Trce right pleurl fluid. 3 mm nodule long the right minor fissure is most likely  subpleurl lymph node on 80/9. Musculoskeletl: No cute osseous bnormlity. Remote nterior left rib frctures. Moderte T12 compression deformity without ventrl cnl encrochment. Review of the MIP imges confirms the bove findings. CTA ABDOMEN AND PELVIS FINDINGS VASCULAR Aort: Atherosclerotic irregulrity within. Mximl cliber infrrenl ort of 3.1 x 2.7 cm. Celic: Widely ptent. SMA: Widely ptent with therosclerotic irregulrity within. Renls: Atherosclerotic irregulrity within both renl rteries, without significnt stenosis. IMA: Ptent Inflow: No significnt stenosis. Veins: Not well evluted Review of the MIP imges confirms the bove findings. NON-VASCULAR Heptobiliry: Norml liver. Norml gllbldder, without biliry ductl dilttion. Pncres: Moderte peripncretic edem is reltively diffuse  including on 140/8. No evidence of pncretic necrosis or duct dilttion. No well-circumscribed peripncretic fluid collection. Spleen: Norml in size, without focl bnormlity. Adrenls/Urinry Trct: Norml drenl glnds. Norml kidneys, without hydronephrosis. Norml urinry bldder. Stomch/Bowel: Edem lso identified bout the intrbdominl stomch nd proximl duodenum. Scttered colonic diverticul. Norml terminl ileum nd ppendix. Norml smll bowel. Lymphtic: No bdominopelvic denopthy. Reproductive: Mild prosttomegly. Other: Smll ft contining left inguinl herni. No significnt free fluid. No free intrperitonel ir. Musculoskeletl: Osteopeni. L1 nd L2 mild-to-moderte compression deformities. Review of the MIP imges confirms the bove findings. IMPRESSION: 1. Moderte non complicted pncretitis. 2. Ascending ortic neurysm on the order of 4.2 cm. Recommend nnul imging followup by CTA or MRA. This recommendtion follows 2010 ACCF/AHA/AATS/ACR/ASA/SCA/SCAI/SIR/STS/SVM Guidelines for the Dignosis nd Mngement of Ptients with Thorcic Aortic Disese. Circultion. 2010; 121: L244-W102. Aortic neurysm NOS (ICD10-I71.9) 3. Lrge hitl herni. Esophgel ir fluid level suggests dysmotility or gstroesophgel reflux. 4. Edem djcent the thorcobdominl stomch nd duodenum is likely secondry to pncretitis. 5. Tiny right pleurl effusion. 6. Osteopeni with thorcolumbr compression deformities. Electroniclly Signed   By: Abigil Miymoto M.D.   On: 04/10/2022 15:00   [place] Abdomen Limited RUQ (LIVER/GB)  Result Dte: 04/22/2022 CLINICAL DATA:  Pncretitis EXAM: ULTRASOUND ABDOMEN LIMITED RIGHT UPPER QUADRANT COMPARISON:  04/10/2022 FINDINGS: Gllbldder: Multiple mobile gllstones re present the gllbldder, lrgest 1.6 cm. Borderline gllbldder wll thickening nd 0.3 cm. Sonogrphic Murphy's sign bsent. Common bile duct: Dimeter: 0.5 cm Liver: No focl lesion  identified. Within norml limits in prenchyml echogenicity. Portl vein is ptent on color Doppler imging with norml direction of blood flow towrds the liver. Other: None. IMPRESSION: 1. Cholelithisis. Borderline gllbldder wll thickening. Sonogrphic Murphy's sign bsent. 2. No directly visulized choledocholithisis. No biliry dilttion. Electroniclly Signed   By: Vn Clines M.D.   On: 04/28/2022 18:18        Scheduled Meds:  crbidop-levodop  1 tblet Orl TID   clopidogrel  75 mg Orl Dily   enoxprin (LOVENOX) injection  40 mg Subcutneous Q24H   pntoprzole (PROTONIX) IV  40 mg Intrvenous Q24H   [START ON 05/03/2022] polyethylene glycol  17 g Orl Dily   Continuous Infusions:  lctted ringers 200 mL/hr t 05/02/22 1650   Followed by   Derrill Memo ON 05-15-2022] lctted ringers       LOS: 1 dy    Time spent: over 30 min    Fyrene Helper, MD [facility]   To contct the ttending provider between 7A-7P or the covering provider during fter hours 7P-7A, plese log into the web site [email or web address] nd ccess using [facility] pssword for tht web site. If you do not  have the password, please call the hospital operator.  05/02/2022, 7:38 PM

## 2022-05-02 NOTE — Assessment & Plan Note (Signed)
4.2 cm, needs annual follow up imaging

## 2022-05-03 DIAGNOSIS — K859 Acute pancreatitis without necrosis or infection, unspecified: Secondary | ICD-10-CM | POA: Diagnosis not present

## 2022-05-03 DIAGNOSIS — G9341 Metabolic encephalopathy: Secondary | ICD-10-CM

## 2022-05-03 DIAGNOSIS — N179 Acute kidney failure, unspecified: Secondary | ICD-10-CM

## 2022-05-03 DIAGNOSIS — I959 Hypotension, unspecified: Secondary | ICD-10-CM

## 2022-05-03 LAB — COMPREHENSIVE METABOLIC PANEL
ALT: 7 U/L (ref 0–44)
AST: 34 U/L (ref 15–41)
Albumin: 2.4 g/dL — ABNORMAL LOW (ref 3.5–5.0)
Alkaline Phosphatase: 72 U/L (ref 38–126)
Anion gap: 5 (ref 5–15)
BUN: 39 mg/dL — ABNORMAL HIGH (ref 8–23)
CO2: 18 mmol/L — ABNORMAL LOW (ref 22–32)
Calcium: 8 mg/dL — ABNORMAL LOW (ref 8.9–10.3)
Chloride: 118 mmol/L — ABNORMAL HIGH (ref 98–111)
Creatinine, Ser: 2.51 mg/dL — ABNORMAL HIGH (ref 0.61–1.24)
GFR, Estimated: 26 mL/min — ABNORMAL LOW (ref >60)
Glucose, Bld: 92 mg/dL (ref 70–99)
Potassium: 5.4 mmol/L — ABNORMAL HIGH (ref 3.5–5.1)
Sodium: 141 mmol/L (ref 135–145)
Total Bilirubin: 1.1 mg/dL (ref 0.3–1.2)
Total Protein: 5.3 g/dL — ABNORMAL LOW (ref 6.5–8.1)

## 2022-05-03 LAB — CBC WITH DIFFERENTIAL/PLATELET
Abs Immature Granulocytes: 0 10*3/uL (ref 0.00–0.07)
Basophils Absolute: 0 10*3/uL (ref 0.0–0.1)
Basophils Relative: 0 %
Eosinophils Absolute: 0.1 10*3/uL (ref 0.0–0.5)
Eosinophils Relative: 1 %
HCT: 40.8 % (ref 39.0–52.0)
Hemoglobin: 12.9 g/dL — ABNORMAL LOW (ref 13.0–17.0)
Lymphocytes Relative: 6 %
Lymphs Abs: 0.6 10*3/uL — ABNORMAL LOW (ref 0.7–4.0)
MCH: 29.1 pg (ref 26.0–34.0)
MCHC: 31.6 g/dL (ref 30.0–36.0)
MCV: 91.9 fL (ref 80.0–100.0)
Monocytes Absolute: 0.4 10*3/uL (ref 0.1–1.0)
Monocytes Relative: 4 %
Neutro Abs: 8.5 10*3/uL — ABNORMAL HIGH (ref 1.7–7.7)
Neutrophils Relative %: 89 %
Platelets: 340 10*3/uL (ref 150–400)
RBC: 4.44 MIL/uL (ref 4.22–5.81)
RDW: 15 % (ref 11.5–15.5)
WBC: 9.5 10*3/uL (ref 4.0–10.5)
nRBC: 0 % (ref 0.0–0.2)
nRBC: 0 /100 WBC

## 2022-05-03 LAB — MAGNESIUM: Magnesium: 1.6 mg/dL — ABNORMAL LOW (ref 1.7–2.4)

## 2022-05-03 LAB — BASIC METABOLIC PANEL
Anion gap: 8 (ref 5–15)
BUN: 43 mg/dL — ABNORMAL HIGH (ref 8–23)
CO2: 18 mmol/L — ABNORMAL LOW (ref 22–32)
Calcium: 7.9 mg/dL — ABNORMAL LOW (ref 8.9–10.3)
Chloride: 117 mmol/L — ABNORMAL HIGH (ref 98–111)
Creatinine, Ser: 2.02 mg/dL — ABNORMAL HIGH (ref 0.61–1.24)
GFR, Estimated: 33 mL/min — ABNORMAL LOW (ref >60)
Glucose, Bld: 80 mg/dL (ref 70–99)
Potassium: 4.6 mmol/L (ref 3.5–5.1)
Sodium: 143 mmol/L (ref 135–145)

## 2022-05-03 LAB — URINE CULTURE: Culture: NO GROWTH

## 2022-05-03 LAB — PHOSPHORUS: Phosphorus: 3.7 mg/dL (ref 2.5–4.6)

## 2022-05-03 MED ORDER — ENOXAPARIN SODIUM 30 MG/0.3ML IJ SOSY
30.0000 mg | PREFILLED_SYRINGE | INTRAMUSCULAR | Status: DC
  Administered 2022-05-03: 30 mg via SUBCUTANEOUS
  Filled 2022-05-03: qty 0.3

## 2022-05-03 MED ORDER — MAGNESIUM SULFATE 2 GM/50ML IV SOLN
2.0000 g | Freq: Once | INTRAVENOUS | Status: AC
Start: 2022-05-03 — End: 2022-05-03
  Administered 2022-05-03: 2 g via INTRAVENOUS
  Filled 2022-05-03: qty 50

## 2022-05-03 MED ORDER — HYDROMORPHONE HCL 1 MG/ML IJ SOLN
0.5000 mg | INTRAMUSCULAR | Status: DC | PRN
  Administered 2022-05-03 – 2022-05-08 (×9): 0.5 mg via INTRAVENOUS
  Filled 2022-05-03 (×9): qty 0.5

## 2022-05-03 NOTE — Assessment & Plan Note (Addendum)
Suspect this is related to opiates, acute hospitalization, pain, acute kidney injury etc He's moving all extremities without any appreciated focal deficits - I think everything (slowed/delayed responses/speech/hallucinations) at this time can be attributed to opiates and hospital delirium.  Out of window for etoh withdrawal now. Continued encephalopathy, waxing and waning Follow with opiates  Delirium precautions High dose thiamine Workup additionally if persistent or worsening

## 2022-05-03 NOTE — Assessment & Plan Note (Addendum)
CT without hydro Given imaging, likely contrast induced nephropathy, hemodynamically mediated with pancreatitis IVF on hold Lasix daily as tolerated Strict I/O, daily weights

## 2022-05-03 NOTE — Progress Notes (Signed)
PROGRESS NOTE    Russell Hebert  KZL:[id number] DOB: [birth year] DOA: 04/21/2022 PCP: Alroy Dust, L.Marlou Sa, MD  Chief Complaint  Patient presents with   Back Pain    Brief Narrative:  Russell Hebert is Russell Hebert 76 y.o. male with medical history significant of parkinsonian syndrome, GERD, hypertension, CAD s/p CABG, history of fall in December resulting in lumbar spine fracture, presented to ED with Russell Hebert complaint of back pain.  Currently patient, he was well and in good health when he slept last night but he when he woke up this morning, he started having back pain which was radiating to the front/abdomen.  The pain was immediately very intense 10 out of 10, crampy and dull interchanging, with no aggravating or relieving factor.  He also had nausea as well as few episodes of vomiting.  No fever, chills, sweating.  No chest pain or shortness of breath.  No other complaint.  No recent sick contact or recent travel.  Patient endorses drinking almost 3-4 beers 2-3 times [name] week but last time he had Senan Urey beer was 4 days ago.  Per patient, he has been having back pain since he fell in December but he is mobile with using Russell Hebert walker and he lives by himself.  This pain is much worse than his usual back pain.   ED Course: Upon arrival to ED, patient was hypertensive, he underwent CTA chest abdomen and pelvis which showed pancreatitis as well as ascending aortic aneurysm of 4.2 cm.  No dissection.  Patient was given pain medications.  Hospital service were consulted for admission.      Assessment & Plan:   Principal Problem:   Pancreatitis Active Problems:   Acute metabolic encephalopathy   Hypotension   Sinus tachycardia   Hyperkalemia   AKI (acute kidney injury) (HCC)   Metabolic acidosis   Elevated LFTs   Ascending aortic aneurysm (HCC)   Hiatal hernia   Parkinsonism (HCC)   History of recent steroid use   Anxiety   Hypertension   GERD (gastroesophageal reflux disease)   Hx of CABG   Carotid artery  disease (HCC)   Hypertensive urgency   Hypokalemia   Leukocytosis   Assessment and Plan: * Pancreatitis CT at presentation with moderate non complicated pancreatitis Given RUQ [place] with cholelithiasis, suspect this is most likely gallstone pancreatitis Hx drinking as well, but seems less likely based on hx.  Normal triglycerides.  No obvious meds that stand out as cause. Repeat CT with progressive acute pancreatitis Aggressive IVF, follow volume status and UOP Pain management, bowel regimen  Hypotension Related to opiates, RR called overnight Improved Follow   Acute metabolic encephalopathy Suspect this is related to opiates, acute hospitalization, pain, acute kidney injury etc Pupils pinpoint He's moving all extremities without any appreciated focal deficits - I think everything (slowed/delayed responses/speech) at this time can be attributed to opiates Will reduce opiate dose and follow  Delirium precautions Workup additionally if persistent  AKI (acute kidney injury) ([place]) CT without hydro Given imaging, likely contrast induced nephropathy, hemodynamically mediated with pancreatitis Follow with IVF Strict I/O, daily weights Maybe plateauing    Hyperkalemia D/c IVF with KCL Repeat PM BMP lokelma  Sinus tachycardia EKG with sinus tach, possible q in II, aVF appear chronic Seems likely related to pain from pancreatitis Follow with IVF   Metabolic acidosis Mild, NAGMA.  Hyperchloremic, follow with LR  Elevated LFTs Elevated alk phos and ast, mild improved  Ascending aortic aneurysm (HCC) 4.2 cm, needs  annual follow up imaging  Hiatal hernia Esophageal air fluid level suggests dysmotility or GERD PPI daily  Parkinsonism ([place]) sinemet  History of recent steroid use Per med rec, he's no longer taking this, will d/c       DVT prophylaxis: lovenox Code Status: DNR Family Communication: discussed with daughter Disposition:   Status is:  Inpatient Remains inpatient appropriate because: additional w/u needed   Consultants:  none  Procedures:  none  Antimicrobials:  Anti-infectives (From admission, onward)    None       Subjective: confused  Objective: Vitals:   05/02/22 2325 05/03/22 0018 05/03/22 0423 05/03/22 0500  BP: (!) 93/57 122/72 115/78   Pulse: (!) 117 (!) 109 (!) 123   Resp: _0 Temp: 98.7 F (37.1 C) 99.2 F (37.3 C) 97.8 F (36.6 C)   TempSrc: Axillary Oral Oral   SpO2: 93% 93% 96%   Weight:    71.8 kg    Intake/Output Summary (Last 24 hours) at 05/03/2022 0835 Last data filed at 05/03/2022 0541 Gross per 24 hour  Intake 4142.89 ml  Output 370 ml  Net 3772.89 ml   Filed Weights   05/02/22 0436 05/03/22 0500  Weight: 71.8 kg 71.8 kg    Examination:  General: less distressed than yesterday, but confused, still gripping side of bed, but pain seems less, more confused today Cardiovascular: RRR Lungs: unlabored Abdomen: diffuse TTP, seems less tender than yesterday Neurological: disoriented, pinpoint pupils, moving all extremities, not consistently following commands Extremities: No clubbing or cyanosis. No edema.   Data Reviewed: I have personally reviewed following labs and imaging studies  CBC: Recent Labs  Lab 04/10/2022 1352 04/05/2022 1416 05/02/22 0120 05/03/22 0603  WBC 15.5*  --  16.4* 9.5  NEUTROABS 14.0*  --   --  8.5*  HGB 13.4 14.3 14.9 12.9*  HCT 43.1 42.0 46.3 40.8  MCV 92.3  --  90.1 91.9  PLT 447*  --  488* 017    Basic Metabolic Panel: Recent Labs  Lab 04/02/2022 1352 04/06/2022 1416 04/10/2022 1849 05/02/22 0120 05/02/22 2106 05/03/22 0603  NA 140 141  --  141 144 141  K 3.3* 3.2*  --  5.4* 5.6* 5.4*  CL 108 111  --  116* 121* 118*  CO2 18*  --   --  20* 16* 18*  GLUCOSE 188* 183*  --  154* 110* 92  BUN 17 17  --  20 32* 39*  CREATININE 1.07 0.80  --  1.02 2.53* 2.51*  CALCIUM 9.1  --   --  7.9* 7.7* 8.0*  MG  --   --  1.9  --   --  1.6*   PHOS  --   --   --   --   --  3.7    GFR: Estimated Creatinine Clearance: 23 mL/min (Lanijah Warzecha) (by C-G formula based on SCr of 2.51 mg/dL (H)).  Liver Function Tests: Recent Labs  Lab 05/02/22 0120 05/02/22 2106 05/03/22 0603  AST 68* 34 34  ALT _1 ALKPHOS 158* 83 72  BILITOT 0.5 1.0 1.1  PROT 6.0* 4.8* 5.3*  ALBUMIN 3.0* 2.2* 2.4*    CBG: Recent Labs  Lab 05/02/22 2022  GLUCAP 106*     No results found for this or any previous visit (from the past 240 hour(s)).       Radiology Studies: CT ABDOMEN PELVIS WO CONTRAST  Result Date: 05/02/2022 CLINICAL DATA:  Abdominal pain EXAM:  CT ABDOMEN AND PELVIS WITHOUT CONTRAST TECHNIQUE: Multidetector CT imaging of the abdomen and pelvis was performed following the standard protocol without IV contrast. RADIATION DOSE REDUCTION: This exam was performed according to the departmental dose-optimization program which includes automated exposure control, adjustment of the mA and/or kV according to patient size and/or use of iterative reconstruction technique. COMPARISON:  CTA abdomen/pelvis dated 04/14/2022 FINDINGS: Motion degraded images. Lower chest: Small bilateral pleural effusions, new. Associated bilateral lower lobe opacities, likely atelectasis. Hepatobiliary: Unenhanced liver is unremarkable. Suspected gallbladder sludge versus noncalcified gallstones (series 3/image 24), without associated inflammatory changes. No intrahepatic or extrahepatic ductal dilatation. Pancreas: Acute pancreatitis with progressive edema and inflammatory change involving the pancreatic tail (series 3/image 22). Progressive peripancreatic inflammatory fluid/stranding along the jejunal mesentery and right retroperitoneum. No walled-off necrosis/pseudocyst. Spleen: Within normal limits.  Mild perisplenic fluid, new. Adrenals/Urinary Tract: Adrenal glands are within normal limits. Residual contrast in the bilateral kidneys, raising concern for contrast induced  nephropathy. Bilateral nonobstructing lower pole renal calculi, measuring up to 4 mm on the left. No hydronephrosis. Excretory contrast in the bladder. Bladder is thick-walled although underdistended. Stomach/Bowel: Stomach is notable for Tranesha Lessner moderate to large hiatal hernia with fluid within the hernia, increased. No evidence of bowel obstruction. Appendix is within normal limits (series 3/image 61). Mild left colonic diverticulosis, without evidence of diverticulitis. Vascular/Lymphatic: No evidence of abdominal aortic aneurysm. Atherosclerotic calcifications of the abdominal aorta and branch vessels. No suspicious abdominopelvic lymphadenopathy. Reproductive: Prostatomegaly. Other: Progressive abdominal ascites, related to acute pancreatitis, as described above. No pelvic ascites. Prominent fat in the left inguinal canal (series 3/image 88), unchanged. Musculoskeletal: Mild superior endplate compression fracture deformity at T12 with Schmorl's node deformity. Mild to moderate compression fracture deformities at L1 and L2. These are unchanged from the prior and likely chronic. IMPRESSION: Progressive acute pancreatitis, as described above. No walled-off necrosis/pseudocyst. Residual contrast in the bilateral kidneys and excretory contrast in the bladder, raising concern for contrast induced nephropathy. Additional stable ancillary findings as above. Electronically Signed   By: Julian Hy M.D.   On: 05/02/2022 22:53   CT Angio Chest/Abd/Pel for Dissection W and/or Wo Contrast  Result Date: 04/27/2022 CLINICAL DATA:  Abdominal pain. History of abdominal aortic aneurysm. EXAM: CT ANGIOGRAPHY CHEST, ABDOMEN AND PELVIS TECHNIQUE: Non-contrast CT of the chest was initially obtained. Multidetector CT imaging through the chest, abdomen and pelvis was performed using the standard protocol during bolus administration of intravenous contrast. Multiplanar reconstructed images and MIPs were obtained and reviewed to  evaluate the vascular anatomy. RADIATION DOSE REDUCTION: This exam was performed according to the departmental dose-optimization program which includes automated exposure control, adjustment of the mA and/or kV according to patient size and/or use of iterative reconstruction technique. CONTRAST:  159m OMNIPAQUE IOHEXOL 350 MG/ML SOLN COMPARISON:  None Available. FINDINGS: CTA CHEST FINDINGS Cardiovascular:  No intramural hematoma on noncontrast imaging. Aortic atherosclerosis. Tortuous descending thoracic aorta. Mild ascending aortic aneurysm, including at 4.2 cm on 37/6 and coronal image 53. Ulcerative plaque in the descending thoracic aorta. Mild cardiomegaly with prior median sternotomy for CABG. No central pulmonary embolism, on this non-dedicated study. Mediastinum/Nodes: No mediastinal or hilar adenopathy. Jamaiyah Pyle large hiatal hernia, with greater than 1/2 of the stomach positioned in the lower chest. The upper esophagus is moderately dilated with fluid level within on 30/8. Edema adjacent the intrathoracic stomach including on 101/8 likely tracks from the abdominal process. Lungs/Pleura: Trace right pleural fluid. 3 mm nodule along the right minor fissure is most likely Khamya Topp  subpleural lymph node on 80/9. Musculoskeletal: No acute osseous abnormality. Remote anterior left rib fractures. Moderate T12 compression deformity without ventral canal encroachment. Review of the MIP images confirms the above findings. CTA ABDOMEN AND PELVIS FINDINGS VASCULAR Aorta: Atherosclerotic irregularity within. Maximal caliber infrarenal aorta of 3.1 x 2.7 cm. Celiac: Widely patent. SMA: Widely patent with atherosclerotic irregularity within. Renals: Atherosclerotic irregularity within both renal arteries, without significant stenosis. IMA: Patent Inflow: No significant stenosis. Veins: Not well evaluated Review of the MIP images confirms the above findings. NON-VASCULAR Hepatobiliary: Normal liver. Normal gallbladder, without biliary  ductal dilatation. Pancreas: Moderate peripancreatic edema is relatively diffuse including on 140/8. No evidence of pancreatic necrosis or duct dilatation. No well-circumscribed peripancreatic fluid collection. Spleen: Normal in size, without focal abnormality. Adrenals/Urinary Tract: Normal adrenal glands. Normal kidneys, without hydronephrosis. Normal urinary bladder. Stomach/Bowel: Edema also identified about the intraabdominal stomach and proximal duodenum. Scattered colonic diverticula. Normal terminal ileum and appendix. Normal small bowel. Lymphatic: No abdominopelvic adenopathy. Reproductive: Mild prostatomegaly. Other: Small fat containing left inguinal hernia. No significant free fluid. No free intraperitoneal air. Musculoskeletal: Osteopenia. L1 and L2 mild-to-moderate compression deformities. Review of the MIP images confirms the above findings. IMPRESSION: 1. Moderate non complicated pancreatitis. 2. Ascending aortic aneurysm on the order of 4.2 cm. Recommend annual imaging followup by CTA or MRA. This recommendation follows 2010 ACCF/AHA/AATS/ACR/ASA/SCA/SCAI/SIR/STS/SVM Guidelines for the Diagnosis and Management of Patients with Thoracic Aortic Disease. Circulation. 2010; 121: O037-C488. Aortic aneurysm NOS (ICD10-I71.9) 3. Large hiatal hernia. Esophageal air fluid level suggests dysmotility or gastroesophageal reflux. 4. Edema adjacent the thoracoabdominal stomach and duodenum is likely secondary to pancreatitis. 5. Tiny right pleural effusion. 6. Osteopenia with thoracolumbar compression deformities. Electronically Signed   By: Abigail Miyamoto M.D.   On: 04/29/2022 15:00   US Abdomen Limited RUQ (LIVER/GB)  Result Date: 04/24/2022 CLINICAL DATA:  Pancreatitis EXAM: ULTRASOUND ABDOMEN LIMITED RIGHT UPPER QUADRANT COMPARISON:  04/10/2022 FINDINGS: Gallbladder: Multiple mobile gallstones are present the gallbladder, largest 1.6 cm. Borderline gallbladder wall thickening and 0.3 cm. Sonographic  Murphy's sign absent. Common bile duct: Diameter: 0.5 cm Liver: No focal lesion identified. Within normal limits in parenchymal echogenicity. Portal vein is patent on color Doppler imaging with normal direction of blood flow towards the liver. Other: None. IMPRESSION: 1. Cholelithiasis. Borderline gallbladder wall thickening. Sonographic Murphy's sign absent. 2. No directly visualized choledocholithiasis. No biliary dilatation. Electronically Signed   By: Van Clines M.D.   On: 04/30/2022 18:18        Scheduled Meds:  carbidopa-levodopa  1 tablet Oral TID   clopidogrel  75 mg Oral Daily   enoxaparin (LOVENOX) injection  40 mg Subcutaneous Q24H   pantoprazole (PROTONIX) IV  40 mg Intravenous Q24H   polyethylene glycol  17 g Oral Daily   sodium zirconium cyclosilicate  10 g Oral Daily   Continuous Infusions:  lactated ringers 200 mL/hr at 05/03/22 8916   Followed by   lactated ringers     magnesium sulfate bolus IVPB       LOS: 2 days    Time spent: over 30 min    Fayrene Helper, MD [facility]   To contact the attending provider between 7A-7P or the covering provider during after hours 7P-7A, please log into the web site [email or web address] and access using universal [place] password for that web site. If you do not have the password, please call the hospital operator.  05/03/2022, 8:35 AM

## 2022-05-03 NOTE — Assessment & Plan Note (Signed)
Related to opiates, RR called overnight Improved Follow

## 2022-05-03 DEATH — deceased

## 2022-05-04 ENCOUNTER — Inpatient Hospital Stay (HOSPITAL_COMMUNITY): Payer: Medicare PPO

## 2022-05-04 DIAGNOSIS — J9601 Acute respiratory failure with hypoxia: Secondary | ICD-10-CM

## 2022-05-04 DIAGNOSIS — R008 Other abnormalities of heart beat: Secondary | ICD-10-CM

## 2022-05-04 DIAGNOSIS — E162 Hypoglycemia, unspecified: Secondary | ICD-10-CM

## 2022-05-04 DIAGNOSIS — K859 Acute pancreatitis without necrosis or infection, unspecified: Secondary | ICD-10-CM | POA: Diagnosis not present

## 2022-05-04 LAB — MAGNESIUM: Magnesium: 2.2 mg/dL (ref 1.7–2.4)

## 2022-05-04 LAB — COMPREHENSIVE METABOLIC PANEL
ALT: 6 U/L (ref 0–44)
ALT: 9 U/L (ref 0–44)
AST: 36 U/L (ref 15–41)
AST: 42 U/L — ABNORMAL HIGH (ref 15–41)
Albumin: 2.1 g/dL — ABNORMAL LOW (ref 3.5–5.0)
Albumin: 2.2 g/dL — ABNORMAL LOW (ref 3.5–5.0)
Alkaline Phosphatase: 48 U/L (ref 38–126)
Alkaline Phosphatase: 49 U/L (ref 38–126)
Anion gap: 7 (ref 5–15)
Anion gap: 8 (ref 5–15)
BUN: 38 mg/dL — ABNORMAL HIGH (ref 8–23)
BUN: 34 mg/dL — ABNORMAL HIGH (ref 8–23)
CO2: 19 mmol/L — ABNORMAL LOW (ref 22–32)
CO2: 21 mmol/L — ABNORMAL LOW (ref 22–32)
Calcium: 7.9 mg/dL — ABNORMAL LOW (ref 8.9–10.3)
Calcium: 7.9 mg/dL — ABNORMAL LOW (ref 8.9–10.3)
Chloride: 118 mmol/L — ABNORMAL HIGH (ref 98–111)
Chloride: 115 mmol/L — ABNORMAL HIGH (ref 98–111)
Creatinine, Ser: 1.46 mg/dL — ABNORMAL HIGH (ref 0.61–1.24)
Creatinine, Ser: 1.29 mg/dL — ABNORMAL HIGH (ref 0.61–1.24)
GFR, Estimated: 49 mL/min — ABNORMAL LOW (ref >60)
GFR, Estimated: 57 mL/min — ABNORMAL LOW (ref >60)
Glucose, Bld: 64 mg/dL — ABNORMAL LOW (ref 70–99)
Glucose, Bld: 81 mg/dL (ref 70–99)
Potassium: 4.2 mmol/L (ref 3.5–5.1)
Potassium: 3.6 mmol/L (ref 3.5–5.1)
Sodium: 144 mmol/L (ref 135–145)
Sodium: 144 mmol/L (ref 135–145)
Total Bilirubin: 1.4 mg/dL — ABNORMAL HIGH (ref 0.3–1.2)
Total Bilirubin: 1.1 mg/dL (ref 0.3–1.2)
Total Protein: 5.2 g/dL — ABNORMAL LOW (ref 6.5–8.1)
Total Protein: 5.2 g/dL — ABNORMAL LOW (ref 6.5–8.1)

## 2022-05-04 LAB — PHOSPHORUS: Phosphorus: 2.8 mg/dL (ref 2.5–4.6)

## 2022-05-04 LAB — CBC WITH DIFFERENTIAL/PLATELET
Abs Immature Granulocytes: 0.02 10*3/uL (ref 0.00–0.07)
Basophils Absolute: 0 10*3/uL (ref 0.0–0.1)
Basophils Relative: 0 %
Eosinophils Absolute: 0 10*3/uL (ref 0.0–0.5)
Eosinophils Relative: 0 %
HCT: 35.2 % — ABNORMAL LOW (ref 39.0–52.0)
Hemoglobin: 11.2 g/dL — ABNORMAL LOW (ref 13.0–17.0)
Immature Granulocytes: 0 %
Lymphocytes Relative: 5 %
Lymphs Abs: 0.3 10*3/uL — ABNORMAL LOW (ref 0.7–4.0)
MCH: 28.7 pg (ref 26.0–34.0)
MCHC: 31.8 g/dL (ref 30.0–36.0)
MCV: 90.3 fL (ref 80.0–100.0)
Monocytes Absolute: 0.2 10*3/uL (ref 0.1–1.0)
Monocytes Relative: 4 %
Neutro Abs: 5.1 10*3/uL (ref 1.7–7.7)
Neutrophils Relative %: 91 %
Platelets: 249 10*3/uL (ref 150–400)
RBC: 3.9 MIL/uL — ABNORMAL LOW (ref 4.22–5.81)
RDW: 15.2 % (ref 11.5–15.5)
WBC Morphology: INCREASED
WBC: 5.6 10*3/uL (ref 4.0–10.5)
nRBC: 0 % (ref 0.0–0.2)

## 2022-05-04 LAB — ECHOCARDIOGRAM COMPLETE
AR max vel: 1.54 cm2
AV Area VTI: 1.44 cm2
AV Area mean vel: 1.48 cm2
AV Mean grad: 13.5 mmHg
AV Peak grad: 22 mmHg
Ao pk vel: 2.35 m/s
Area-P 1/2: 5.7 cm2
Calc EF: 62.2 %
S' Lateral: 3 cm
Single Plane A2C EF: 60.1 %
Single Plane A4C EF: 64.2 %
Weight: 2737.23 oz

## 2022-05-04 LAB — GLUCOSE, CAPILLARY
Glucose-Capillary: 108 mg/dL — ABNORMAL HIGH (ref 70–99)
Glucose-Capillary: 49 mg/dL — ABNORMAL LOW (ref 70–99)
Glucose-Capillary: 70 mg/dL (ref 70–99)
Glucose-Capillary: 79 mg/dL (ref 70–99)

## 2022-05-04 LAB — LIPASE, BLOOD: Lipase: 112 U/L — ABNORMAL HIGH (ref 11–51)

## 2022-05-04 LAB — BRAIN NATRIURETIC PEPTIDE: B Natriuretic Peptide: 121.1 pg/mL — ABNORMAL HIGH (ref 0.0–100.0)

## 2022-05-04 LAB — MRSA NEXT GEN BY PCR, NASAL: MRSA by PCR Next Gen: NOT DETECTED

## 2022-05-04 LAB — STREP PNEUMONIAE URINARY ANTIGEN: Strep Pneumo Urinary Antigen: NEGATIVE

## 2022-05-04 MED ORDER — VITAL 1.5 CAL PO LIQD
1000.0000 mL | ORAL | Status: DC
  Administered 2022-05-04: 1000 mL
  Filled 2022-05-04: qty 1000

## 2022-05-04 MED ORDER — LEVALBUTEROL HCL 0.63 MG/3ML IN NEBU
0.6300 mg | INHALATION_SOLUTION | Freq: Once | RESPIRATORY_TRACT | Status: AC
  Administered 2022-05-04: 0.63 mg via RESPIRATORY_TRACT
  Filled 2022-05-04: qty 3

## 2022-05-04 MED ORDER — FUROSEMIDE 10 MG/ML IJ SOLN
40.0000 mg | Freq: Once | INTRAMUSCULAR | Status: AC
  Administered 2022-05-04: 40 mg via INTRAVENOUS
  Filled 2022-05-04: qty 4

## 2022-05-04 MED ORDER — DEXTROSE 50 % IV SOLN
12.5000 g | INTRAVENOUS | Status: AC
  Administered 2022-05-04: 12.5 g via INTRAVENOUS
  Filled 2022-05-04: qty 50

## 2022-05-04 MED ORDER — SODIUM CHLORIDE 0.9 % IV SOLN
3.0000 g | Freq: Four times a day (QID) | INTRAVENOUS | Status: DC
  Administered 2022-05-04 – 2022-05-06 (×8): 3 g via INTRAVENOUS
  Filled 2022-05-04 (×8): qty 8

## 2022-05-04 MED ORDER — METOCLOPRAMIDE HCL 5 MG/ML IJ SOLN: 5.0000 mg | Freq: Once | INTRAMUSCULAR | Status: DC | PRN

## 2022-05-04 MED ORDER — LEVALBUTEROL HCL 0.63 MG/3ML IN NEBU
0.6300 mg | INHALATION_SOLUTION | RESPIRATORY_TRACT | Status: DC | PRN
  Administered 2022-05-06 – 2022-05-08 (×2): 0.63 mg via RESPIRATORY_TRACT
  Filled 2022-05-04 (×2): qty 3

## 2022-05-04 MED ORDER — POLYETHYLENE GLYCOL 3350 17 G PO PACK
17.0000 g | PACK | Freq: Two times a day (BID) | ORAL | Status: DC
  Administered 2022-05-04: 17 g via ORAL
  Filled 2022-05-04 (×2): qty 1

## 2022-05-04 MED ORDER — FUROSEMIDE 10 MG/ML IJ SOLN
20.0000 mg | Freq: Once | INTRAMUSCULAR | Status: AC
  Administered 2022-05-04: 20 mg via INTRAVENOUS
  Filled 2022-05-04: qty 2

## 2022-05-04 MED ORDER — THIAMINE HCL 100 MG/ML IJ SOLN
100.0000 mg | Freq: Every day | INTRAMUSCULAR | Status: DC
  Administered 2022-05-04: 100 mg via INTRAVENOUS
  Filled 2022-05-04: qty 2

## 2022-05-04 MED ORDER — PIVOT 1.5 CAL PO LIQD: 1000.0000 mL | ORAL | Status: DC

## 2022-05-04 MED ORDER — ENOXAPARIN SODIUM 40 MG/0.4ML IJ SOSY
40.0000 mg | PREFILLED_SYRINGE | INTRAMUSCULAR | Status: DC
  Administered 2022-05-04 – 2022-05-08 (×5): 40 mg via SUBCUTANEOUS
  Filled 2022-05-04 (×5): qty 0.4

## 2022-05-04 MED ORDER — DEXTROSE 50 % IV SOLN
1.0000 | Freq: Once | INTRAVENOUS | Status: AC
  Administered 2022-05-04: 50 mL via INTRAVENOUS
  Filled 2022-05-04: qty 50

## 2022-05-04 MED ORDER — DEXTROSE 10 % IV SOLN: INTRAVENOUS | Status: DC

## 2022-05-04 NOTE — Progress Notes (Signed)
Initial Nutrition Assessment  DOCUMENTATION CODES:  Severe malnutrition in context of social or environmental circumstances  INTERVENTION:  Trial clears as able Initiate trickle tube feeding of Vital 1.5 via cortrak tube.  If tolerated, advance to the following as able: Vital 1.5 at 60 ml/h (1440 ml per day) Standard free water flush of 40m q4h to maintain tube patency. Increase as needed. Provides 2160 kcal, 97 gm protein, 1100 ml free water daily  NUTRITION DIAGNOSIS:  Severe Malnutrition related to social / environmental circumstances (etoh use, inadequate PO) as evidenced by severe fat depletion, severe muscle depletion.  GOAL:  Patient will meet greater than or equal to 90% of their needs  MONITOR:  Diet advancement, TF tolerance, Skin, Labs  REASON FOR ASSESSMENT:  New TF, Consult Enteral/tube feeding initiation and management  ASSESSMENT:  Pt with hx of parkinson's, GERD, HTN, and CAD presented to ED with severe back pain. Found to have acute pancreatitis   Pt resting in bed at the time of visit having cortrak tube placed. Significant muscle and fat deficits present on exam suggestive of long-term undernourishment. Pt's speech difficult to understand, dentures not in place at this time. Does endorse weight loss over the years (stated 200 was his previous usual).   Pt reports moderate EtOH use, family reports that his intake is significant.  Pt has a large hiatal hernia. Cortrak attempted to be placed post-pyloric but unable to be advanced due to anatomy. Pt did not tolerate procedure particularly well due to pain and some instances of desaturation. Pt breathing through mouth and tongue fissured and dry. Per XR, cortrak gastric.  Discussed different means of providing nutrition with MD. Will attempt trickle feeds with pt to determine if gastric is tolerated. If unable to tolerate, may consider alternate means of nutrition such as TPN. May also ask radiology to advance tube  post-pyloric. Will enter recommendations for trickles.  Nutritionally Relevant Medications: Scheduled Meds:  pantoprazole IV  40 mg Intravenous Q24H   polyethylene glycol  17 g Oral BID   thiamine injection  100 mg Intravenous Daily   Continuous Infusions:  ampicillin-sulbactam (UNASYN) IV 3 g (05/04/22 1453)   dextrose 30 mL/hr at 05/04/22 0835   PRN Meds: metoCLOPramide, ondansetron  Labs Reviewed: Glucose 64 BUN 38, creatinine 1.46  NUTRITION - FOCUSED PHYSICAL EXAM: Flowsheet Row Most Recent Value  Orbital Region Severe depletion  Upper Arm Region Severe depletion  Thoracic and Lumbar Region Mild depletion  Buccal Region Severe depletion  Temple Region No depletion  Clavicle Bone Region No depletion  Clavicle and Acromion Bone Region Mild depletion  Scapular Bone Region Mild depletion  Dorsal Hand Severe depletion  Patellar Region Severe depletion  Anterior Thigh Region Severe depletion  Posterior Calf Region Severe depletion  Edema (RD Assessment) None  Hair Reviewed  Eyes Reviewed  Mouth Reviewed  [dry cracked tongue, mouth breathing]  Skin Reviewed  Nails Reviewed    Diet Order:   Diet Order             Diet NPO time specified Except for: Sips with Meds  Diet effective now                   EDUCATION NEEDS:  Not appropriate for education at this time  Skin:  Skin Assessment: Reviewed RN Assessment  Last BM:  5/29  Height:  Ht Readings from Last 1 Encounters:  05/04/22 '5\' 7"'$  (1.702 m)   Weight:  Wt Readings from Last 1 Encounters:  05/04/22 77.6 kg    Ideal Body Weight:  67.3 kg  BMI:  Body mass index is 26.79 kg/m.  Estimated Nutritional Needs:  Kcal:  2000-2200 kcal/d Protein:  100-115 g/d Fluid:  >2L/d    Russell Hebert, RD, LDN Clinical Dietitian RD pager # available in [facility]  After hours/weekend pager # available in [facility]

## 2022-05-04 NOTE — Progress Notes (Signed)
       REMOTE CROSS COVER NOTE  NAME: Russell Hebert MRN: [id number] DOB : [birth year]    Date of Service   05/04/22  HPI/Events of Note   Page received from nursing reporting expiratory wheezing and dyspnea as well as yellow MEWS BP 160/94 HR 118 RR 22 SPO2 91% on 3.5L O2 via Nasal Cannula Temp 98.75F  Per nursing no crackles on exam or edema noted  Interventions   Plan: Xopenex nebulized CXR Pause IVF Supplemental SPO2 titrate for SPO2 >90%      Alesia Banda, MHA, FNP-BC Nurse Practitioner [facility] Pager [phone]

## 2022-05-04 NOTE — Progress Notes (Addendum)
**Note De-Identified vi Obfusction** PROGRESS NOTE    Russell Hebert  TZG:[id number] DOB: 1946/06/19 DOA: 04/09/2022 PCP: Alroy Dust, L.Mrlou S, MD  Chief Complint  Ptient presents with   Bck Pin    Brief Nrrtive:  Russell Hebert is  76 y.o. mle with medicl history significnt of prkinsonin syndrome, GERD, hypertension, CAD s/p CABG, history of fll in December resulting in lumbr spine frcture, presented to ED with  complint of bck pin.  Lived independently nd wlked with wlker prior to dmission.  Drnk 3-4 beers 2-3x  week, but lst beer 4 dys piror to dmission.  CT with findings concerning for pncretitis.  Hospitl course complicted by delirium nd uncontrolled pin.    GI consulted 6/2.   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  Norml triglycerides.  No obvious meds tht stnd out s cuse. Holding IVF t this point with CXR findings tody concerning for pneumoni, net positive 3.7 L, weight to 77.6 kg from 71.8 (uncler ccurcy weights/I&Os) Abdominl exm with more tenderness tody, mildly distended, follow plin films/bldder scn  Will probbly  need NG/post pyloric feeding Pin mngement, bowel regimen Consider GI consult   Acute respirtory filure with hypoxi (HCC) Wheezing nd SOB noticed overnight CXR 6/2 with severe multilobr bilterl pneumoni Will strt bx with unsyn  Follow blood cultures, sputum cx if ble, urine strep, MRSA PCR Could be relted to volume overlod, follow BNP nd echo Will give tril of lsix tody nd follow response  Hypotension Relted to opites improved  Acute metbolic encephlopthy Suspect this is relted to opites, cute hospitliztion, pin, cute kidney injury etc He's moving ll extremities without ny pprecited focl deficits - I think everything (slowed/delyed responses/speech/hllucintions) t this time cn be ttributed to opites nd hospitl delirium  Follow with opites  Delirium precutions Workup dditionlly if persistent or worsening  AKI (cute kidney injury) ([place]) CT without hydro Given imging, likely contrst induced nephropthy, hemodynmiclly medited with pncretitis Improving tody fter IVF (currently on hold) UOP poorly mesured, bldder scn Will tril dose of lsix nd follow Strict I/O, dily weights (weights trending up, net positive)   Hyperklemi S/p lokelm follow  Sinus tchycrdi EKG with sinus tch, possible q in II, VF pper chronic Seems likely relted to pin from pncretitis IVF on hold due to resp sttus nd CXR findigns Follow echo    Metbolic cidosis Mild, NAGMA.   Follow  Elevted LFTs Elevted lk phos nd st, mild Improved, follow  Hypoglycemi Due to poor PO intke D10 fluids, follow  A1c  Ascending ortic neurysm (HCC) 4.2 cm, needs nnul follow up imging  Hitl herni Esophgel  air fluid level suggests dysmotility or GERD PPI daily  Parkinsonism ([place]) sinemet  History of recent steroid use Per med rec, he's no longer taking this, will d/c       DVT prophylaxis: lovenox Code  Status: DNR Family Communication: discussed with daughter Disposition:   Status is: Inpatient Remains inpatient appropriate because: additional w/u needed   Consultants:  none  Procedures:  none  ntimicrobials:  nti-infectives (From admission, onward)    Start     Dose/Rate Route Frequency Ordered Stop   05/04/22 0830  mpicillin-Sulbactam (UNSYN) 3 g in sodium chloride 0.9 % 100 mL IVPB        3 g 200 mL/hr over 30 Minutes Intravenous Every 6 hours 05/04/22 0740         Subjective: Remains confused Pain is "ok"  Objective: Vitals:   05/04/22 0600 05/04/22 0619 05/04/22 0640 05/04/22 0701  BP:  (!) 160/94  (!) 162/117  Pulse:  (!) 118  61  Resp:  (!) 22  19  Temp:  98.9 F (37.2 C)  99.9 F (37.7 C)  TempSrc:    Oral  SpO2:  91% 92% (!) 86%  Weight: 77.6 kg       Intake/Output Summary (Last 24 hours) at 05/04/2022 0955 Last data filed at 05/04/2022 0600 Gross per 24 hour  Intake 762.24 ml  Output 600 ml  Net 162.24 ml   Filed Weights   05/02/22 0436 05/03/22 0500 05/04/22 0600  Weight: 71.8 kg 71.8 kg 77.6 kg    Examination:  General: No acute distress. Cardiovascular: tachy, regular Lungs: no clearly appreciated crackles bdomen: mildly distended, diffusely TTP, most tender in LUQ, no rebound Neurological: disoriented, difficult to understand at times, speaks to someone not in room.   Moving all extremities. Extremities: No clubbing or cyanosis. No edema.  Data Reviewed: I have personally reviewed following labs and imaging studies  CBC: Recent Labs  Lab 04/10/2022 1352 04/10/2022 1416 05/02/22 0120 05/03/22 0603 05/04/22 0412  WBC 15.5*  --  16.4* 9.5 5.6  NEUTROBS 14.0*  --   --  8.5* 5.1  HGB 13.4 14.3 14.9 12.9* 11.2*  HCT 43.1 42.0 46.3 40.8 35.2*  MCV 92.3  --  90.1 91.9 90.3  PLT 447*  --  488* 340 161    Basic Metabolic Panel: Recent Labs  Lab 04/29/2022 1849 05/02/22 0120 05/02/22 2106 05/03/22 0603 05/03/22 1559  05/04/22 0412  N  --  141 144 141 143 144  K  --  5.4* 5.6* 5.4* 4.6 4.2  CL  --  116* 121* 118* 117* 118*  CO2  --  20* 16* 18* 18* 19*  GLUCOSE  --  154* 110* 92 80 64*  BUN  --  20 32* 39* 43* 38*  CRETININE  --  1.02 2.53* 2.51* 2.02* 1.46*  CLCIUM  --  7.9* 7.7* 8.0* 7.9* 7.9*  MG 1.9  --   --  1.6*  --  2.2  PHOS  --   --   --  3.7  --  2.8    GFR: Estimated Creatinine Clearance: 39.6 mL/min () (by C-G formula based on SCr of 1.46 mg/dL (H)).  Liver Function Tests: Recent Labs  Lab 05/02/22 0120 05/02/22 2106 05/03/22 0603 05/04/22 0412  ST 68* 34 34 36  LT _0 LKPHOS 158* 83 72 48  BILITOT 0.5 1.0 1.1 1.4*  PROT 6.0* 4.8* 5.3* 5.2*  LBUMIN 3.0* 2.2* 2.4* 2.1* **Note De-Identified vi Obfusction** CBG: Recent Lbs  Lb 05/02/22 2022 05/04/22 0804 05/04/22 0926  GLUCAP 106* 49* 70     Recent Results (from the pst 240 hour(s))  Urine Culture     Sttus: None   Collection Time: 05/02/22 12:41 PM   Specimen: Urine, Clen Ctch  Result Vlue Ref Rnge Sttus   Specimen Description URINE, CLEAN CATCH  Finl   Specil Requests NONE  Finl   Culture   Finl    NO GROWTH Performed t [facility], [place] [address] [place], [place] [address]    Report Sttus 05/03/2022 FINAL  Finl  Culture, blood (Routine X 2) w Reflex to ID Pnel     Sttus: None (Preliminry result)   Collection Time: 05/04/22  8:49 AM   Specimen: BLOOD RIGHT WRIST  Result Vlue Ref Rnge Sttus   Specimen Description BLOOD RIGHT WRIST  Finl   Specil Requests   Finl    BOTTLES DRAWN AEROBIC AND ANAEROBIC Blood Culture dequte volume Performed t [facility], [place] [address] [place], [place] [address]    Culture PENDING  Incomplete   Report Sttus PENDING  Incomplete         Rdiology Studies: CT ABDOMEN PELVIS WO CONTRAST  Result Dte: 05/02/2022 CLINICAL DATA:  Abdominl pin EXAM: CT ABDOMEN AND PELVIS WITHOUT CONTRAST TECHNIQUE: Multidetector CT imging of the bdomen nd pelvis  ws performed following the stndrd protocol without IV contrst. RADIATION DOSE REDUCTION: This exm ws performed ccording to the deprtmentl dose-optimiztion progrm which includes utomted exposure control, djustment of the mA nd/or kV ccording to ptient size nd/or use of itertive reconstruction technique. COMPARISON:  CTA bdomen/pelvis dted 04/21/2022 FINDINGS: Motion degrded imges. Lower chest: Smll bilterl pleurl effusions, new. Associted bilterl lower lobe opcities, likely telectsis. Heptobiliry: Unenhnced liver is unremrkble. Suspected gllbldder sludge versus nonclcified gllstones (series 3/imge 24), without ssocited inflmmtory chnges. No intrheptic or extrheptic ductl dilttion. Pncres: Acute pncretitis with progressive edem nd inflmmtory chnge involving the pncretic til (series 3/imge 22). Progressive peripncretic inflmmtory fluid/strnding long the jejunl mesentery nd right retroperitoneum. No wlled-off necrosis/pseudocyst. Spleen: Within norml limits.  Mild perisplenic fluid, new. Adrenls/Urinry Trct: Adrenl glnds re within norml limits. Residul contrst in the bilterl kidneys, rising concern for contrst induced nephropthy. Bilterl nonobstructing lower pole renl clculi, mesuring up to 4 mm on the left. No hydronephrosis. Excretory contrst in the bldder. Bldder is thick-wlled lthough underdistended. Stomch/Bowel: Stomch is notble for  moderte to lrge hitl herni with fluid within the herni, incresed. No evidence of bowel obstruction. Appendix is within norml limits (series 3/imge 61). Mild left colonic diverticulosis, without evidence of diverticulitis. Vsculr/Lymphtic: No evidence of bdominl ortic neurysm. Atherosclerotic clcifictions of the bdominl ort nd brnch vessels. No suspicious bdominopelvic lymphdenopthy. Reproductive: Prosttomegly. Other: Progressive bdominl scites,  relted to cute pncretitis, s described bove. No pelvic scites. Prominent ft in the left inguinl cnl (series 3/imge 88), unchnged. Musculoskeletl: Mild superior endplte compression frcture deformity t T12 with Schmorl's node deformity. Mild to moderte compression frcture deformities t L1 nd L2. These re unchnged from the prior nd likely chronic. IMPRESSION: Progressive cute pncretitis, s described bove. No wlled-off necrosis/pseudocyst. Residul contrst in the bilterl kidneys nd excretory contrst in the bldder, rising concern for contrst induced nephropthy. Additionl stble ncillry findings s bove. Electroniclly Signed   By: Julin Hy M.D.   On: 05/02/2022 22:53   DG CHEST PORT 1 VIEW  Result Dte: 05/04/2022 CLINICAL DATA:  51 yer old mle with history of  shortness of breath, cough and congestion. EXAM: PORTABLE CHEST 1 VIEW COMPARISON:  Chest x-ray 11/29/2008. FINDINGS: Lung volumes are low. Patchy multifocal areas of interstitial prominence and airspace consolidation are noted throughout the lungs bilaterally (left-greater-than-right), most confluent at the left lung base. Small left pleural effusion. No right pleural effusion. No pneumothorax. No evidence of pulmonary edema. Heart size appears borderline enlarged. The patient is rotated to the left on today's exam, resulting in distortion of the mediastinal contours and reduced diagnostic sensitivity and specificity for mediastinal pathology. Atherosclerotic calcifications in the thoracic aorta. Status post median sternotomy for CABG. IMPRESSION: 1. The appearance the chest is most concerning for severe multilobar bilateral bronchopneumonia, most confluent in the left lower lobe. 2. Small left parapneumonic pleural effusion. 3. Aortic atherosclerosis. Electronically Signed   By: Vinnie Langton M.D.   On: 05/04/2022 07:20        Scheduled Meds:  carbidopa-levodopa  1 tablet Oral TID   clopidogrel   75 mg Oral Daily   enoxaparin (LOVENOX) injection  40 mg Subcutaneous Q24H   furosemide  40 mg Intravenous Once   pantoprazole (PROTONIX) IV  40 mg Intravenous Q24H   polyethylene glycol  17 g Oral Daily   thiamine injection  100 mg Intravenous Daily   Continuous Infusions:  ampicillin-sulbactam (UNASYN) IV 3 g (05/04/22 0804)   dextrose 30 mL/hr at 05/04/22 0835   lactated ringers 10 mL/hr at 05/04/22 0700     LOS: 3 days    Time spent: over 30 min    Fayrene Helper, MD [facility]   To contact the attending provider between 7A-7P or the covering provider during after hours 7P-7A, please log into the web site [email or web address] and access using universal [place] password for that web site. If you do not have the password, please call the hospital operator.  05/04/2022, 9:55 AM

## 2022-05-04 NOTE — Assessment & Plan Note (Addendum)
Due to poor PO intake D5 fluids, follow  Cortrak, tube feeds started A1c 5.5

## 2022-05-04 NOTE — Procedures (Signed)
Cortrak  Tube Type:  Cortrak - 43 inches Tube Location:  Left nare Initial Placement:  Stomach Secured by: Bridle Technique Used to Measure Tube Placement:  Marking at nare/corner of mouth Cortrak Secured At:  74 cm  Cortrak Tube Team Note:  Consult received to place a Cortrak feeding tube.   X-ray is required, abdominal x-ray has been ordered by the Cortrak team. Please confirm tube placement before using the Cortrak tube.   If the tube becomes dislodged please keep the tube and contact the Cortrak team at [email or web address] (password [email or web address]) for replacement.  If after hours and replacement cannot be delayed, place a NG tube and confirm placement with an abdominal x-ray.    Koleen Distance MS, RD, LDN Please refer to [facility] for RD and/or RD on-call/weekend/after hours pager

## 2022-05-04 NOTE — Consult Note (Signed)
Referring Provider:  [facility] Primary Care Physician:  Alroy Dust, L.Marlou Sa, MD Primary Gastroenterologist: Sadie Haber primary  Reason for Consultation: Pancreatitis  HPI: Russell Hebert is a 76 y.o. male with past medical history of coronary artery disease s/p CABG, history of fall in December, history of parkinsonian syndrome and GERD presented to the hospital with abdominal pain and back pain on May 01, 2022.  Initial blood work showed elevated white count and elevated platelet counts probably from hemoconcentration, significantly elevated lipase at 6730, mildly elevated LFTs with AST 68 and alkaline phosphatase 158, normal triglycerides.   CT angio on admission showed moderate peripancreatic edema throughout the pancreas concerning for pancreatitis, large hiatal hernia, edema of the thoracic abdomen and duodenum likely from pancreatitis.  Ultrasound gallbladder showed cholelithiasis.  CBD 5 mm.  Repeat CT scan on May 02, 2022 showed progressively worsening edema and inflammation particularly around pancreatic tail.  No evidence of necrosis.  GI is consulted for further evaluation.  Patient seen and examined at bedside.  Somewhat confused.  Family at bedside.  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  No previous history of pancreatitis.      Past Medical History:  Diagnosis Date   Carotid artery disease ([place])    Doppler, December, 2010, 0-39% bilateral   Coronary artery disease    Acute lateral MI October, 2008, bare-metal stent circumflex /  catheterization November, 2009, LIMA-LAD patent,, all vein grafts patent OM stent patent... some sluggish distal flow... 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    Prior to Admission medications   Medication Sig Start Date End Date Taking? Authorizing Provider  acetaminophen (TYLENOL) 500 MG tablet Take 1,000 mg by mouth every 6 (six) hours as needed for headache (pain).   Yes [provider]  atorvastatin (LIPITOR) 40 MG tablet Take 40 mg by mouth every morning. 02/19/22  Yes [provider]  carbidopa-levodopa (SINEMET IR) 25-100 MG tablet TAKE 1 TABLET THREE TIMES DAILY AT 7AM, 11AM AND 4PM Patient taking differently: Take 1 tablet by mouth every morning. 10/30/21  Yes Tat, Eustace Quail, DO  citalopram (CELEXA) 10 MG tablet Take 10 mg by mouth every morning.   Yes [provider]  clopidogrel (PLAVIX) 75 MG tablet Take 75 mg by mouth every morning.   Yes [provider]  diazepam (VALIUM) 5 MG tablet Take 5 mg by mouth daily as needed (dizziness).   Yes [provider]  omeprazole (PRILOSEC) 20 MG capsule Take 20 mg by mouth every morning.   Yes [provider]  traMADol (ULTRAM) 50 MG tablet Take 50 mg by mouth every 6 (six) hours as needed (pain).   Yes [provider]  carbidopa-levodopa (SINEMET CR) 50-200 MG tablet Take 1 tablet by mouth at bedtime. Patient not taking: Reported on 04/18/2022 11/14/21   Tat, Eustace Quail, DO  predniSONE (DELTASONE) 20 MG tablet 2 tablet Patient not  taking: Reported on 05/02/2022 10/29/21   [provider]    Scheduled Meds:  carbidopa-levodopa  1 tablet Oral TID   clopidogrel  75 mg Oral Daily   enoxaparin (LOVENOX) injection  40 mg Subcutaneous Q24H   furosemide  40 mg Intravenous Once   pantoprazole (PROTONIX) IV  40 mg Intravenous Q24H   polyethylene glycol  17 g Oral Daily    thiamine injection  100 mg Intravenous Daily   Continuous Infusions:  ampicillin-sulbactam (UNASYN) IV 3 g (05/04/22 0804)   dextrose 30 mL/hr at 05/04/22 0835   PRN Meds:.acetaminophen **OR** acetaminophen, diazepam, hydrALAZINE, HYDROmorphone (DILAUDID) injection, ondansetron **OR** ondansetron (ZOFRAN) IV  Allergies as of 05/02/2022 - Review Complete 04/13/2022  Allergen Reaction Noted   Morphine Anaphylaxis    Celebrex [celecoxib]  12/08/2020    Family History  Problem Relation Age of Onset   Heart disease Mother    Heart failure Mother    Heart failure Father     Social History   Socioeconomic History   Marital status: Married    Spouse name: Not on file   Number of children: Not on file   Years of education: Not on file   Highest education level: Not on file  Occupational History   Occupation: retired    Comment: heating and air conditioning  Tobacco Use   Smoking status: Never   Smokeless tobacco: Never  Vaping Use   Vaping Use: Never used  Substance and Sexual Activity   Alcohol use: Yes    Comment: causal   Drug use: No   Sexual activity: Not Currently  Other Topics Concern   Not on file  Social History Narrative   Right Handed   Lives in a one story home    Social Determinants of Health   Financial Resource Strain: Not on file  Food Insecurity: Not on file  Transportation Needs: Not on file  Physical Activity: Not on file  Stress: Not on file  Social Connections: Not on file  Intimate Partner Violence: Not on file    Review of Systems: Not able to obtain  Physical Exam: Vital signs: Vitals:   05/04/22 0640 05/04/22 0701  BP:  (!) 162/117  Pulse:  61  Resp:  19  Temp:  99.9 F (37.7 C)  SpO2: 92% (!) 86%   Last BM Date : 04/30/22 General: Resting comfortably, confused,  Lungs: Mild respiratory distress noted, anterior exam only, Heart:  Regular rate and rhythm; no murmurs, clicks, rubs,  or gallops. Abdomen: Mild epigastric  discomfort on palpation, abdomen is soft, bowel sounds present, no peritoneal signs Lower extremity -no edema Rectal:  Deferred  GI:  Lab Results: Recent Labs    05/02/22 0120 05/03/22 0603 05/04/22 0412  WBC 16.4* 9.5 5.6  HGB 14.9 12.9* 11.2*  HCT 46.3 40.8 35.2*  PLT 488* 340 249   BMET Recent Labs    05/03/22 0603 05/03/22 1559 05/04/22 0412  NA 141 143 144  K 5.4* 4.6 4.2  CL 118* 117* 118*  CO2 18* 18* 19*  GLUCOSE 92 80 64*  BUN 39* 43* 38*  CREATININE 2.51* 2.02* 1.46*  CALCIUM 8.0* 7.9* 7.9*   LFT Recent Labs    05/02/22 2106 05/03/22 0603 05/04/22 0412  PROT 4.8*   < > 5.2*  ALBUMIN 2.2*   < > 2.1*  AST 34   < > 36  ALT 7   < > 6  ALKPHOS 83   < > 48  BILITOT  1.0   < > 1.4*  BILIDIR 0.4*  --   --   IBILI 0.6  --   --    < > = values in this interval not displayed.   PT/INR No results for input(s): LABPROT, INR in the last 72 hours.   Studies/Results: CT ABDOMEN PELVIS WO CONTRAST  Result Date: 05/02/2022 CLINICAL DATA:  Abdominal pain EXAM: CT ABDOMEN AND PELVIS WITHOUT CONTRAST TECHNIQUE: Multidetector CT imaging of the abdomen and pelvis was performed following the standard protocol without IV contrast. RADIATION DOSE REDUCTION: This exam was performed according to the departmental dose-optimization program which includes automated exposure control, adjustment of the mA and/or kV according to patient size and/or use of iterative reconstruction technique. COMPARISON:  CTA abdomen/pelvis dated 04/09/2022 FINDINGS: Motion degraded images. Lower chest: Small bilateral pleural effusions, new. Associated bilateral lower lobe opacities, likely atelectasis. Hepatobiliary: Unenhanced liver is unremarkable. Suspected gallbladder sludge versus noncalcified gallstones (series 3/image 24), without associated inflammatory changes. No intrahepatic or extrahepatic ductal dilatation. Pancreas: Acute pancreatitis with progressive edema and inflammatory change involving  the pancreatic tail (series 3/image 22). Progressive peripancreatic inflammatory fluid/stranding along the jejunal mesentery and right retroperitoneum. No walled-off necrosis/pseudocyst. Spleen: Within normal limits.  Mild perisplenic fluid, new. Adrenals/Urinary Tract: Adrenal glands are within normal limits. Residual contrast in the bilateral kidneys, raising concern for contrast induced nephropathy. Bilateral nonobstructing lower pole renal calculi, measuring up to 4 mm on the left. No hydronephrosis. Excretory contrast in the bladder. Bladder is thick-walled although underdistended. Stomach/Bowel: Stomach is notable for a moderate to large hiatal hernia with fluid within the hernia, increased. No evidence of bowel obstruction. Appendix is within normal limits (series 3/image 61). Mild left colonic diverticulosis, without evidence of diverticulitis. Vascular/Lymphatic: No evidence of abdominal aortic aneurysm. Atherosclerotic calcifications of the abdominal aorta and branch vessels. No suspicious abdominopelvic lymphadenopathy. Reproductive: Prostatomegaly. Other: Progressive abdominal ascites, related to acute pancreatitis, as described above. No pelvic ascites. Prominent fat in the left inguinal canal (series 3/image 88), unchanged. Musculoskeletal: Mild superior endplate compression fracture deformity at T12 with Schmorl's node deformity. Mild to moderate compression fracture deformities at L1 and L2. These are unchanged from the prior and likely chronic. IMPRESSION: Progressive acute pancreatitis, as described above. No walled-off necrosis/pseudocyst. Residual contrast in the bilateral kidneys and excretory contrast in the bladder, raising concern for contrast induced nephropathy. Additional stable ancillary findings as above. Electronically Signed   By: Julian Hy M.D.   On: 05/02/2022 22:53   DG CHEST PORT 1 VIEW  Result Date: 05/04/2022 CLINICAL DATA:  76 year old male with history of shortness  of breath, cough and congestion. EXAM: PORTABLE CHEST 1 VIEW COMPARISON:  Chest x-ray 11/29/2008. FINDINGS: Lung volumes are low. Patchy multifocal areas of interstitial prominence and airspace consolidation are noted throughout the lungs bilaterally (left-greater-than-right), most confluent at the left lung base. Small left pleural effusion. No right pleural effusion. No pneumothorax. No evidence of pulmonary edema. Heart size appears borderline enlarged. The patient is rotated to the left on today's exam, resulting in distortion of the mediastinal contours and reduced diagnostic sensitivity and specificity for mediastinal pathology. Atherosclerotic calcifications in the thoracic aorta. Status post median sternotomy for CABG. IMPRESSION: 1. The appearance the chest is most concerning for severe multilobar bilateral bronchopneumonia, most confluent in the left lower lobe. 2. Small left parapneumonic pleural effusion. 3. Aortic atherosclerosis. Electronically Signed   By: Vinnie Langton M.D.   On: 05/04/2022 07:20    Impression/Plan: -Acute severe pancreatitis.  CT scan  negative for necrosis.  Differential will be alcohol use versus gallstone pancreatitis.  Patient has been doing heavy alcohol use according to patient's son.  Normal LFTs.  Normal triglycerides -Chest x-ray concerning for pneumonia and pleural effusion -Acute kidney injury.  Improved -Coronary artery disease currently on Plavix  Recommendation ------------------------ -IV hydration has been stopped because of pleural effusion and shortness of breath. -Okay with core track for nutrition. -May consider repeat CT scan with IV contrast over the weekend or early next week to rule out pancreatic necrosis once kidney function improves. -Increase MiraLAX to twice a day -GI will follow   LOS: 3 days   Otis Brace  MD, FACP 05/04/2022, 10:25 AM  Contact #  [phone]

## 2022-05-04 NOTE — Care Management Important Message (Signed)
Important Message  Patient Details  Name: Russell Hebert MRN: [id number] Date of Birth: Aug 23, 1946   Medicare Important Message Given:  Yes     [name] 05/04/2022, 3:01 PM

## 2022-05-04 NOTE — Assessment & Plan Note (Addendum)
Repeat CXR 6/4 pending Broaden abx to zosyn for fever Follow blood cultures NG (repeat pending), sputum cx if able, urine strep negative, MRSA PCR negative suspect component related to volume overload, follow BNP (elevated) and echo (poor quality study, normal EF, indeterminate diastolic parameters) Continue lasix daily as tolerated Continue I/O, daily weights

## 2022-05-04 NOTE — Plan of Care (Signed)

## 2022-05-04 NOTE — Progress Notes (Signed)
Pharmacy Antibiotic Note  Russell Hebert is a 76 y.o. male admitted on 04/11/2022 with back and abdominal pain, found to have acute gallstone pancreatitis.  Pharmacy has been consulted for Unasyn dosing for PNA.  CXR concerning for severe multilobar bilateral bronchopneumonia.  Renal function improving, afebrile, WBC WNL.  Plan: Unasyn 3gm IV Q6H Pharmacy will sign off with improving renal clearance Increase Lovenox to '40mg'$  SQ daily  Weight: 77.6 kg (171 lb 1.2 oz)  Temp (24hrs), Avg:99.4 F (37.4 C), Min:98.2 F (36.8 C), Max:100.2 F (37.9 C)  Recent Labs  Lab 04/21/2022 1352 04/29/2022 1416 05/02/22 0120 05/02/22 2106 05/03/22 0603 05/03/22 1559 05/04/22 0412  WBC 15.5*  --  16.4*  --  9.5  --  5.6  CREATININE 1.07   < > 1.02 2.53* 2.51* 2.02* 1.46*   < > = values in this interval not displayed.    Estimated Creatinine Clearance: 39.6 mL/min (A) (by C-G formula based on SCr of 1.46 mg/dL (H)).    Allergies  Allergen Reactions   Morphine Anaphylaxis   Celebrex [Celecoxib] Diarrhea    Unasyn 6/2 >>  5/31 UCx - negative 5/31 BCx -  5/31 MRSA PCR -   My [place] D. Mina Marble, PharmD, BCPS, [place] 05/04/2022, 7:38 AM

## 2022-05-05 DIAGNOSIS — E870 Hyperosmolality and hypernatremia: Secondary | ICD-10-CM

## 2022-05-05 DIAGNOSIS — F1010 Alcohol abuse, uncomplicated: Secondary | ICD-10-CM

## 2022-05-05 DIAGNOSIS — K859 Acute pancreatitis without necrosis or infection, unspecified: Secondary | ICD-10-CM | POA: Diagnosis not present

## 2022-05-05 DIAGNOSIS — E43 Unspecified severe protein-calorie malnutrition: Secondary | ICD-10-CM

## 2022-05-05 DIAGNOSIS — R1310 Dysphagia, unspecified: Secondary | ICD-10-CM

## 2022-05-05 LAB — CBC WITH DIFFERENTIAL/PLATELET
Abs Immature Granulocytes: 0.03 10*3/uL (ref 0.00–0.07)
Basophils Absolute: 0 10*3/uL (ref 0.0–0.1)
Basophils Relative: 1 %
Eosinophils Absolute: 0 10*3/uL (ref 0.0–0.5)
Eosinophils Relative: 0 %
HCT: 36.4 % — ABNORMAL LOW (ref 39.0–52.0)
Hemoglobin: 11.8 g/dL — ABNORMAL LOW (ref 13.0–17.0)
Immature Granulocytes: 1 %
Lymphocytes Relative: 7 %
Lymphs Abs: 0.4 10*3/uL — ABNORMAL LOW (ref 0.7–4.0)
MCH: 28.6 pg (ref 26.0–34.0)
MCHC: 32.4 g/dL (ref 30.0–36.0)
MCV: 88.3 fL (ref 80.0–100.0)
Monocytes Absolute: 0.6 10*3/uL (ref 0.1–1.0)
Monocytes Relative: 11 %
Neutro Abs: 4.2 10*3/uL (ref 1.7–7.7)
Neutrophils Relative %: 80 %
Platelets: 232 10*3/uL (ref 150–400)
RBC: 4.12 MIL/uL — ABNORMAL LOW (ref 4.22–5.81)
RDW: 15.2 % (ref 11.5–15.5)
WBC: 5.2 10*3/uL (ref 4.0–10.5)
nRBC: 0 % (ref 0.0–0.2)

## 2022-05-05 LAB — COMPREHENSIVE METABOLIC PANEL
ALT: 10 U/L (ref 0–44)
AST: 41 U/L (ref 15–41)
Albumin: 2.3 g/dL — ABNORMAL LOW (ref 3.5–5.0)
Alkaline Phosphatase: 50 U/L (ref 38–126)
Anion gap: 7 (ref 5–15)
BUN: 32 mg/dL — ABNORMAL HIGH (ref 8–23)
CO2: 23 mmol/L (ref 22–32)
Calcium: 8 mg/dL — ABNORMAL LOW (ref 8.9–10.3)
Chloride: 116 mmol/L — ABNORMAL HIGH (ref 98–111)
Creatinine, Ser: 1.2 mg/dL (ref 0.61–1.24)
GFR, Estimated: >60 mL/min (ref >60)
Glucose, Bld: 103 mg/dL — ABNORMAL HIGH (ref 70–99)
Potassium: 3.5 mmol/L (ref 3.5–5.1)
Sodium: 146 mmol/L — ABNORMAL HIGH (ref 135–145)
Total Bilirubin: 0.8 mg/dL (ref 0.3–1.2)
Total Protein: 5.5 g/dL — ABNORMAL LOW (ref 6.5–8.1)

## 2022-05-05 LAB — BRAIN NATRIURETIC PEPTIDE: B Natriuretic Peptide: 158.3 pg/mL — ABNORMAL HIGH (ref 0.0–100.0)

## 2022-05-05 LAB — BASIC METABOLIC PANEL
Anion gap: 10 (ref 5–15)
BUN: 28 mg/dL — ABNORMAL HIGH (ref 8–23)
CO2: 22 mmol/L (ref 22–32)
Calcium: 7.6 mg/dL — ABNORMAL LOW (ref 8.9–10.3)
Chloride: 112 mmol/L — ABNORMAL HIGH (ref 98–111)
Creatinine, Ser: 1.1 mg/dL (ref 0.61–1.24)
GFR, Estimated: >60 mL/min (ref >60)
Glucose, Bld: 165 mg/dL — ABNORMAL HIGH (ref 70–99)
Potassium: 3.2 mmol/L — ABNORMAL LOW (ref 3.5–5.1)
Sodium: 144 mmol/L (ref 135–145)

## 2022-05-05 LAB — GLUCOSE, CAPILLARY
Glucose-Capillary: 114 mg/dL — ABNORMAL HIGH (ref 70–99)
Glucose-Capillary: 129 mg/dL — ABNORMAL HIGH (ref 70–99)
Glucose-Capillary: 89 mg/dL (ref 70–99)
Glucose-Capillary: 173 mg/dL — ABNORMAL HIGH (ref 70–99)
Glucose-Capillary: 146 mg/dL — ABNORMAL HIGH (ref 70–99)

## 2022-05-05 LAB — PHOSPHORUS: Phosphorus: 2.1 mg/dL — ABNORMAL LOW (ref 2.5–4.6)

## 2022-05-05 LAB — HEMOGLOBIN A1C
Hgb A1c MFr Bld: 5.5 % (ref 4.8–5.6)
Mean Plasma Glucose: 111.15 mg/dL

## 2022-05-05 LAB — MAGNESIUM: Magnesium: 2.4 mg/dL (ref 1.7–2.4)

## 2022-05-05 MED ORDER — CARBIDOPA-LEVODOPA 25-100 MG PO TABS
1.0000 | ORAL_TABLET | Freq: Three times a day (TID) | ORAL | Status: DC
  Administered 2022-05-05 – 2022-05-09 (×12): 1 via ORAL
  Filled 2022-05-05 (×13): qty 1

## 2022-05-05 MED ORDER — CLOPIDOGREL BISULFATE 75 MG PO TABS
75.0000 mg | ORAL_TABLET | Freq: Every day | ORAL | Status: DC
  Administered 2022-05-06 – 2022-05-09 (×4): 75 mg via ORAL
  Filled 2022-05-05 (×4): qty 1

## 2022-05-05 MED ORDER — CARBIDOPA-LEVODOPA 25-100 MG PO TABS
1.0000 | ORAL_TABLET | Freq: Three times a day (TID) | ORAL | Status: DC
  Administered 2022-05-05: 1
  Filled 2022-05-05: qty 1

## 2022-05-05 MED ORDER — METOPROLOL TARTRATE 12.5 MG HALF TABLET
12.5000 mg | ORAL_TABLET | Freq: Two times a day (BID) | ORAL | Status: DC
  Administered 2022-05-05 – 2022-05-06 (×2): 12.5 mg via ORAL
  Filled 2022-05-05 (×2): qty 1

## 2022-05-05 MED ORDER — LOPERAMIDE HCL 2 MG PO CAPS
2.0000 mg | ORAL_CAPSULE | Freq: Once | ORAL | Status: AC
Start: 2022-05-05 — End: 2022-05-05
  Administered 2022-05-05: 2 mg via ORAL
  Filled 2022-05-05: qty 1

## 2022-05-05 MED ORDER — POTASSIUM CHLORIDE 20 MEQ PO PACK
40.0000 meq | PACK | ORAL | Status: AC
  Administered 2022-05-05 (×2): 40 meq via ORAL
  Filled 2022-05-05 (×2): qty 2

## 2022-05-05 MED ORDER — FUROSEMIDE 10 MG/ML IJ SOLN
40.0000 mg | Freq: Once | INTRAMUSCULAR | Status: AC
  Administered 2022-05-05: 40 mg via INTRAVENOUS
  Filled 2022-05-05: qty 4

## 2022-05-05 MED ORDER — ADULT MULTIVITAMIN W/MINERALS CH
1.0000 | ORAL_TABLET | Freq: Every day | ORAL | Status: DC
  Administered 2022-05-06 – 2022-05-09 (×4): 1
  Filled 2022-05-05 (×4): qty 1

## 2022-05-05 MED ORDER — POLYETHYLENE GLYCOL 3350 17 G PO PACK: 17.0000 g | PACK | Freq: Every day | ORAL | Status: DC | PRN

## 2022-05-05 MED ORDER — THIAMINE HCL 100 MG/ML IJ SOLN
500.0000 mg | Freq: Three times a day (TID) | INTRAMUSCULAR | Status: AC
  Administered 2022-05-05 – 2022-05-07 (×9): 500 mg via INTRAVENOUS
  Filled 2022-05-05 (×10): qty 5

## 2022-05-05 MED ORDER — FOLIC ACID 1 MG PO TABS
1.0000 mg | ORAL_TABLET | Freq: Every day | ORAL | Status: DC
  Administered 2022-05-05: 1 mg via ORAL
  Filled 2022-05-05: qty 1

## 2022-05-05 MED ORDER — THIAMINE HCL 100 MG/ML IJ SOLN: 100.0000 mg | Freq: Every day | INTRAMUSCULAR | Status: DC

## 2022-05-05 MED ORDER — DIAZEPAM 5 MG/ML IJ SOLN: 2.5000 mg | Freq: Two times a day (BID) | INTRAMUSCULAR | Status: DC | PRN

## 2022-05-05 MED ORDER — FOLIC ACID 1 MG PO TABS
1.0000 mg | ORAL_TABLET | Freq: Every day | ORAL | Status: DC
  Administered 2022-05-06 – 2022-05-09 (×4): 1 mg via ORAL
  Filled 2022-05-05 (×4): qty 1

## 2022-05-05 MED ORDER — ADULT MULTIVITAMIN W/MINERALS CH
1.0000 | ORAL_TABLET | Freq: Every day | ORAL | Status: DC
  Administered 2022-05-05: 1 via ORAL
  Filled 2022-05-05: qty 1

## 2022-05-05 MED ORDER — VITAL 1.5 CAL PO LIQD
1000.0000 mL | ORAL | Status: DC
  Administered 2022-05-05 – 2022-05-09 (×5): 1000 mL
  Filled 2022-05-05 (×9): qty 1000

## 2022-05-05 MED ORDER — FOLIC ACID 1 MG PO TABS: 1.0000 mg | ORAL_TABLET | Freq: Every day | ORAL | Status: DC

## 2022-05-05 MED ORDER — DIAZEPAM 5 MG PO TABS
5.0000 mg | ORAL_TABLET | Freq: Every day | ORAL | Status: DC | PRN
Start: 2022-05-05 — End: 2022-05-09
  Administered 2022-05-05 – 2022-05-06 (×2): 5 mg via ORAL
  Filled 2022-05-05 (×2): qty 1

## 2022-05-05 MED ORDER — K PHOS MONO-SOD PHOS DI & MONO 155-852-130 MG PO TABS
500.0000 mg | ORAL_TABLET | Freq: Four times a day (QID) | ORAL | Status: AC
  Administered 2022-05-05 – 2022-05-06 (×4): 500 mg via ORAL
  Filled 2022-05-05 (×4): qty 2

## 2022-05-05 MED ORDER — CLOPIDOGREL BISULFATE 75 MG PO TABS: 75.0000 mg | ORAL_TABLET | Freq: Every day | ORAL | Status: DC

## 2022-05-05 MED ORDER — SODIUM CHLORIDE 0.9 % IV SOLN
250.0000 mg | Freq: Every day | INTRAVENOUS | Status: DC
  Administered 2022-05-08 – 2022-05-09 (×2): 250 mg via INTRAVENOUS
  Filled 2022-05-05 (×2): qty 2.5

## 2022-05-05 NOTE — Progress Notes (Signed)
   05/05/22 1552  Assess: MEWS Score  Temp 98.1 F (36.7 C)  BP (!) 142/84  MAP (mmHg) 98  Pulse Rate (!) 129  Resp 20  SpO2 92 %  O2 Device Nasal Cannula  O2 Flow Rate (L/min) 3 L/min  Assess: MEWS Score  MEWS Temp 0  MEWS Systolic 0  MEWS Pulse 2  MEWS RR 0  MEWS LOC 0  MEWS Score 2  MEWS Score Color Yellow  Assess: if the MEWS score is Yellow or Red  Were vital signs taken at a resting state? Yes  Focused Assessment No change from prior assessment  Treat  MEWS Interventions Administered prn meds/treatments  Pain Score Asleep  Patients Stated Pain Goal 0  Pain Intervention(s) Medication (See eMAR)  Breathing 0  Negative Vocalization 0  Facial Expression 0  Body Language 0  Consolability 0  PAINAD Score 0  Document  Patient Outcome Stabilized after interventions  Assess: SIRS CRITERIA  SIRS Temperature  0  SIRS Pulse 1  SIRS Respirations  0  SIRS WBC 0  SIRS Score Sum  1

## 2022-05-05 NOTE — Assessment & Plan Note (Addendum)
Increase rate for D5, follow

## 2022-05-05 NOTE — Progress Notes (Signed)
   05/05/22 1200  Assess: MEWS Score  Temp 99.1 F (37.3 C)  BP (!) 145/94  MAP (mmHg) 107  Pulse Rate (!) 110  Resp 20  SpO2 91 %  O2 Device Nasal Cannula  O2 Flow Rate (L/min) 3 L/min  Assess: MEWS Score  MEWS Temp 0  MEWS Systolic 0  MEWS Pulse 1  MEWS RR 0  MEWS LOC 0  MEWS Score 1  MEWS Score Color Green  Assess: if the MEWS score is Yellow or Red  Were vital signs taken at a resting state? Yes  Focused Assessment No change from prior assessment  Does the patient meet 2 or more of the SIRS criteria? Yes  Does the patient have a confirmed or suspected source of infection? No  Treat  MEWS Interventions Administered scheduled meds/treatments  Pain Scale 0-10  Pain Score 0  Patients Stated Pain Goal 0  Document  Patient Outcome Stabilized after interventions  Assess: SIRS CRITERIA  SIRS Temperature  0  SIRS Pulse 1  SIRS Respirations  0  SIRS WBC 0  SIRS Score Sum  1

## 2022-05-05 NOTE — Progress Notes (Signed)
Subjective: Denies abdominal pain. Appears short of breath.  Objective: Vital signs in last 24 hours: Temp:  [97.4 F (36.3 C)-99.2 F (37.3 C)] 97.4 F (36.3 C) (06/03 0715) Pulse Rate:  [109-130] 130 (06/03 0715) Resp:  [16-22] 20 (06/03 0715) BP: (137-169)/(84-99) 169/99 (06/03 0715) SpO2:  [87 %-94 %] 93 % (06/03 0715) Weight change:  Last BM Date : 04/30/22  PE: Receiving NG tube feeding GENERAL: Appears short of breath, able to speak in few words not full sentences  ABDOMEN: Distended but not tense, bowel sounds audible EXTREMITIES: No deformity  Lab Results: Results for orders placed or performed during the hospital encounter of 04/19/2022 (from the past 48 hour(s))  Basic metabolic panel     Status: Abnormal   Collection Time: 05/03/22  3:59 PM  Result Value Ref Range   Sodium 143 135 - 145 mmol/L   Potassium 4.6 3.5 - 5.1 mmol/L   Chloride 117 (H) 98 - 111 mmol/L   CO2 18 (L) 22 - 32 mmol/L   Glucose, Bld 80 70 - 99 mg/dL    Comment: Glucose reference range applies only to samples taken after fasting for at least 8 hours.   BUN 43 (H) 8 - 23 mg/dL   Creatinine, Ser 2.02 (H) 0.61 - 1.24 mg/dL   Calcium 7.9 (L) 8.9 - 10.3 mg/dL   GFR, Estimated 33 (L) >60 mL/min    Comment: (NOTE) Calculated using the CKD-EPI Creatinine Equation (2021)    Anion gap 8 5 - 15    Comment: Performed at [facility] [address] [place], [place] [address]  CBC with Differential/Platelet     Status: Abnormal   Collection Time: 05/04/22  4:12 AM  Result Value Ref Range   WBC 5.6 4.0 - 10.5 K/uL   RBC 3.90 (L) 4.22 - 5.81 MIL/uL   Hemoglobin 11.2 (L) 13.0 - 17.0 g/dL   HCT 35.2 (L) 39.0 - 52.0 %   MCV 90.3 80.0 - 100.0 fL   MCH 28.7 26.0 - 34.0 pg   MCHC 31.8 30.0 - 36.0 g/dL   RDW 15.2 11.5 - 15.5 %   Platelets 249 150 - 400 K/uL   nRBC 0.0 0.0 - 0.2 %   Neutrophils Relative % 91 %   Neutro Abs 5.1 1.7 - 7.7 K/uL   Lymphocytes Relative 5 %   Lymphs Abs 0.3 (L) 0.7 -  4.0 K/uL   Monocytes Relative 4 %   Monocytes Absolute 0.2 0.1 - 1.0 K/uL   Eosinophils Relative 0 %   Eosinophils Absolute 0.0 0.0 - 0.5 K/uL   Basophils Relative 0 %   Basophils Absolute 0.0 0.0 - 0.1 K/uL   WBC Morphology INCREASED BANDS (>20% BANDS)     Comment: MILD LEFT SHIFT (1-5% METAS, OCC MYELO, OCC BANDS) VACUOLATED NEUTROPHILS    Smear Review Reviewed    Immature Granulocytes 0 %   Abs Immature Granulocytes 0.02 0.00 - 0.07 K/uL   Burr Cells PRESENT     Comment: Performed at [facility], [place] [address] [place], [place] [address]  Comprehensive metabolic panel     Status: Abnormal   Collection Time: 05/04/22  4:12 AM  Result Value Ref Range   Sodium 144 135 - 145 mmol/L   Potassium 4.2 3.5 - 5.1 mmol/L   Chloride 118 (H) 98 - 111 mmol/L   CO2 19 (L) 22 - 32 mmol/L   Glucose, Bld 64 (L) 70 - 99 mg/dL  Comment: Glucose reference range applies only to samples taken after fasting for at least 8 hours.   BUN 38 (H) 8 - 23 mg/dL   Creatinine, Ser 1.46 (H) 0.61 - 1.24 mg/dL   Calcium 7.9 (L) 8.9 - 10.3 mg/dL   Total Protein 5.2 (L) 6.5 - 8.1 g/dL   Albumin 2.1 (L) 3.5 - 5.0 g/dL   AST 36 15 - 41 U/L   ALT 6 0 - 44 U/L   Alkaline Phosphatase 48 38 - 126 U/L   Total Bilirubin 1.4 (H) 0.3 - 1.2 mg/dL   GFR, Estimated 49 (L) >60 mL/min    Comment: (NOTE) Calculated using the CKD-EPI Creatinine Equation (2021)    Anion gap 7 5 - 15    Comment: Performed at [facility], [place] [address], [place], [place] [address]  Magnesium     Status: None   Collection Time: 05/04/22  4:12 AM  Result Value Ref Range   Magnesium 2.2 1.7 - 2.4 mg/dL    Comment: Performed at [facility] [address], [place], [place] [address]  Phosphorus     Status: None   Collection Time: 05/04/22  4:12 AM  Result Value Ref Range   Phosphorus 2.8 2.5 - 4.6 mg/dL    Comment: Performed at [facility] [address], [place], [place] [address]  Lipase, blood     Status:  Abnormal   Collection Time: 05/04/22  4:12 AM  Result Value Ref Range   Lipase 112 (H) 11 - 51 U/L    Comment: Performed at [facility], [place] [address] [place], [place] [address]  MRSA Next Gen by PCR, Nasal     Status: None   Collection Time: 05/04/22  7:28 AM   Specimen: Nasal Mucosa; Nasal Swab  Result Value Ref Range   MRSA by PCR Next Gen NOT DETECTED NOT DETECTED    Comment: (NOTE) The GeneXpert MRSA Assay (FDA approved for NASAL specimens only), is one component of a comprehensive MRSA colonization surveillance program. It is not intended to diagnose MRSA infection nor to guide or monitor treatment for MRSA infections. Test performance is not FDA approved in patients less than 59 years old. Performed at [facility], [place] [address], [place], [place] [address]   Glucose, capillary     Status: Abnormal   Collection Time: 05/04/22  8:04 AM  Result Value Ref Range   Glucose-Capillary 49 (L) 70 - 99 mg/dL    Comment: Glucose reference range applies only to samples taken after fasting for at least 8 hours.   Comment 1 Notify RN   Strep pneumoniae urinary antigen     Status: None   Collection Time: 05/04/22  8:29 AM  Result Value Ref Range   Strep Pneumo Urinary Antigen NEGATIVE NEGATIVE    Comment:        Infection due to S. pneumoniae cannot be absolutely ruled out since the antigen present may be below the detection limit of the test. Performed at [facility], [address] [place], [place] [address]   Brain natriuretic peptide     Status: Abnormal   Collection Time: 05/04/22  8:38 AM  Result Value Ref Range   B Natriuretic Peptide 121.1 (H) 0.0 - 100.0 pg/mL    Comment: Performed at [facility] [address] [place],  [address]  Culture, blood (Routine X 2) w Reflex to ID Panel  Status: None (Preliminary result)   Collection Time: 05/04/22  8:39 AM   Specimen: BLOOD RIGHT FOREARM  Result Value Ref Range   Specimen  Description BLOOD RIGHT FOREARM    Special Requests      BOTTLES DRAWN AEROBIC AND ANAEROBIC Blood Culture adequate volume   Culture      NO GROWTH < 24 HOURS Performed at [facility], [place] [address], [place], [place] [address]    Report Status PENDING   Culture, blood (Routine X 2) w Reflex to ID Panel     Status: None (Preliminary result)   Collection Time: 05/04/22  8:49 AM   Specimen: BLOOD RIGHT WRIST  Result Value Ref Range   Specimen Description BLOOD RIGHT WRIST    Special Requests      BOTTLES DRAWN AEROBIC AND ANAEROBIC Blood Culture adequate volume   Culture      NO GROWTH < 24 HOURS Performed at [facility], [place] [address], [name], [place] [address]    Report Status PENDING   Glucose, capillary     Status: None   Collection Time: 05/04/22  9:26 AM  Result Value Ref Range   Glucose-Capillary 70 70 - 99 mg/dL    Comment: Glucose reference range applies only to samples taken after fasting for at least 8 hours.  Comprehensive metabolic panel     Status: Abnormal   Collection Time: 05/04/22  4:30 PM  Result Value Ref Range   Sodium 144 135 - 145 mmol/L   Potassium 3.6 3.5 - 5.1 mmol/L   Chloride 115 (H) 98 - 111 mmol/L   CO2 21 (L) 22 - 32 mmol/L   Glucose, Bld 81 70 - 99 mg/dL    Comment: Glucose reference range applies only to samples taken after fasting for at least 8 hours.   BUN 34 (H) 8 - 23 mg/dL   Creatinine, Ser 1.29 (H) 0.61 - 1.24 mg/dL   Calcium 7.9 (L) 8.9 - 10.3 mg/dL   Total Protein 5.2 (L) 6.5 - 8.1 g/dL   Albumin 2.2 (L) 3.5 - 5.0 g/dL   AST 42 (H) 15 - 41 U/L   ALT 9 0 - 44 U/L   Alkaline Phosphatase 49 38 - 126 U/L   Total Bilirubin 1.1 0.3 - 1.2 mg/dL   GFR, Estimated 57 (L) >60 mL/min    Comment: (NOTE) Calculated using the CKD-EPI Creatinine Equation (2021)    Anion gap 8 5 - 15    Comment: Performed at [facility], [place] [address], [place], [place] [address]  Glucose, capillary     Status: None   Collection Time:  05/04/22  8:18 PM  Result Value Ref Range   Glucose-Capillary 79 70 - 99 mg/dL    Comment: Glucose reference range applies only to samples taken after fasting for at least 8 hours.  Glucose, capillary     Status: Abnormal   Collection Time: 05/04/22 11:35 PM  Result Value Ref Range   Glucose-Capillary 108 (H) 70 - 99 mg/dL    Comment: Glucose reference range applies only to samples taken after fasting for at least 8 hours.  Comprehensive metabolic panel     Status: Abnormal   Collection Time: 05/05/22  1:23 AM  Result Value Ref Range   Sodium 146 (H) 135 - 145 mmol/L   Potassium 3.5 3.5 - 5.1 mmol/L   Chloride 116 (H) 98 - 111 mmol/L   CO2 23 22 - 32 mmol/L  Glucose, Bld 103 (H) 70 - 99 mg/dL    Comment: Glucose reference range applies only to samples taken after fasting for at least 8 hours.   BUN 32 (H) 8 - 23 mg/dL   Creatinine, Ser 1.20 0.61 - 1.24 mg/dL   Calcium 8.0 (L) 8.9 - 10.3 mg/dL   Total Protein 5.5 (L) 6.5 - 8.1 g/dL   Albumin 2.3 (L) 3.5 - 5.0 g/dL   AST 41 15 - 41 U/L   ALT 10 0 - 44 U/L   Alkaline Phosphatase 50 38 - 126 U/L   Total Bilirubin 0.8 0.3 - 1.2 mg/dL   GFR, Estimated >60 >60 mL/min    Comment: (NOTE) Calculated using the CKD-EPI Creatinine Equation (2021)    Anion gap 7 5 - 15    Comment: Performed at [facility], [place] [address] [place], [place] [address]  CBC with Differential/Platelet     Status: Abnormal   Collection Time: 05/05/22  1:23 AM  Result Value Ref Range   WBC 5.2 4.0 - 10.5 K/uL   RBC 4.12 (L) 4.22 - 5.81 MIL/uL   Hemoglobin 11.8 (L) 13.0 - 17.0 g/dL   HCT 36.4 (L) 39.0 - 52.0 %   MCV 88.3 80.0 - 100.0 fL   MCH 28.6 26.0 - 34.0 pg   MCHC 32.4 30.0 - 36.0 g/dL   RDW 15.2 11.5 - 15.5 %   Platelets 232 150 - 400 K/uL   nRBC 0.0 0.0 - 0.2 %   Neutrophils Relative % 80 %   Neutro Abs 4.2 1.7 - 7.7 K/uL   Lymphocytes Relative 7 %   Lymphs Abs 0.4 (L) 0.7 - 4.0 K/uL   Monocytes Relative 11 %   Monocytes Absolute 0.6 0.1 -  1.0 K/uL   Eosinophils Relative 0 %   Eosinophils Absolute 0.0 0.0 - 0.5 K/uL   Basophils Relative 1 %   Basophils Absolute 0.0 0.0 - 0.1 K/uL   WBC Morphology DOHLE BODIES    RBC Morphology MORPHOLOGY UNREMARKABLE    Smear Review MORPHOLOGY UNREMARKABLE    Immature Granulocytes 1 %   Abs Immature Granulocytes 0.03 0.00 - 0.07 K/uL    Comment: Performed at [facility], [place] [address] [place], [place] [address]  Magnesium     Status: None   Collection Time: 05/05/22  1:23 AM  Result Value Ref Range   Magnesium 2.4 1.7 - 2.4 mg/dL    Comment: Performed at [facility] [address], [place], [place] [address]  Phosphorus     Status: Abnormal   Collection Time: 05/05/22  1:23 AM  Result Value Ref Range   Phosphorus 2.1 (L) 2.5 - 4.6 mg/dL    Comment: Performed at [facility] [address], [place], [place] [address]  Glucose, capillary     Status: Abnormal   Collection Time: 05/05/22  4:18 AM  Result Value Ref Range   Glucose-Capillary 114 (H) 70 - 99 mg/dL    Comment: Glucose reference range applies only to samples taken after fasting for at least 8 hours.    Studies/Results: DG Abd 1 View  Result Date: 05/04/2022 CLINICAL DATA:  Epigastric abdominal pain.  Nausea. EXAM: ABDOMEN - 1 VIEW COMPARISON:  CT, 05/02/2022. FINDINGS: No bowel dilation to suggest obstruction. Aortic, iliac and femoral artery vascular calcifications. No evidence of renal or ureteral stones. No acute skeletal abnormality. IMPRESSION: 1. No evidence of bowel obstruction. 2. Findings of pancreatitis noted on  the recent prior CT are not visualized radiographically. Electronically Signed   By: Lajean Manes M.D.   On: 05/04/2022 13:00   DG CHEST PORT 1 VIEW  Result Date: 05/04/2022 CLINICAL DATA:  Shortness of breath EXAM: PORTABLE CHEST 1 VIEW COMPARISON:  05/04/2022, CT 04/08/2022, FINDINGS: Post sternotomy changes. Esophageal tube is looped at the lower chest within a hiatal hernia, tip  below the diaphragm and probably within the distal stomach. Cardiomegaly with vascular congestion and hazy lung fields likely due to mild edema. There is a small left-sided effusion. IMPRESSION: 1. Cardiomegaly with mild vascular congestion and hazy pulmonary opacity possible edema. Small left-sided effusion. 2. Esophageal tube is looped at the lower chest within a hiatal hernia with the tip position below the diaphragm and probably within the distal stomach. Electronically Signed   By: Donavan Foil M.D.   On: 05/04/2022 21:23   DG CHEST PORT 1 VIEW  Result Date: 05/04/2022 CLINICAL DATA:  76 year old male with history of shortness of breath, cough and congestion. EXAM: PORTABLE CHEST 1 VIEW COMPARISON:  Chest x-ray 11/29/2008. FINDINGS: Lung volumes are low. Patchy multifocal areas of interstitial prominence and airspace consolidation are noted throughout the lungs bilaterally (left-greater-than-right), most confluent at the left lung base. Small left pleural effusion. No right pleural effusion. No pneumothorax. No evidence of pulmonary edema. Heart size appears borderline enlarged. The patient is rotated to the left on today's exam, resulting in distortion of the mediastinal contours and reduced diagnostic sensitivity and specificity for mediastinal pathology. Atherosclerotic calcifications in the thoracic aorta. Status post median sternotomy for CABG. IMPRESSION: 1. The appearance the chest is most concerning for severe multilobar bilateral bronchopneumonia, most confluent in the left lower lobe. 2. Small left parapneumonic pleural effusion. 3. Aortic atherosclerosis. Electronically Signed   By: Vinnie Langton M.D.   On: 05/04/2022 07:20   DG Abd Portable 1V  Result Date: 05/04/2022 CLINICAL DATA:  [id number].  Feeding tube placement EXAM: PORTABLE ABDOMEN - 1 VIEW COMPARISON:  X-ray abdomen 62/2/23 2:25 p.m., CT abdomen pelvis 05/02/2022, CT abdomen pelvis 04/12/2022 FINDINGS: Hiatal hernia. Interval  advancement of an enteric tube with tip overlying the expected region of the gastric lumen. The bowel gas pattern is normal. No radio-opaque calculi or other significant radiographic abnormality are seen. Aortic calcification. IMPRESSION: 1. Enteric tube with tip overlying the expected region of the gastric lumen. 2. Hiatal hernia. 3.  Aortic Atherosclerosis (ICD10-I70.0). Electronically Signed   By: Iven Finn M.D.   On: 05/04/2022 15:51   DG Abd Portable 1V  Result Date: 05/04/2022 CLINICAL DATA:  Feeding tube placement. EXAM: PORTABLE ABDOMEN - 1 VIEW COMPARISON:  05/04/2022 at 12:32 p.m. FINDINGS: Enteric tube tip projects in the medial left upper quadrant consistent with positioning at or just below the expected location of the gastroesophageal junction. Normal bowel gas pattern. IMPRESSION: 1. Enteric feeding tube tip lies at the expected location of the gastroesophageal junction. This will need to be further inserted, at least 20 cm, to allow adequate length for the tube to into the duodenum. Electronically Signed   By: Lajean Manes M.D.   On: 05/04/2022 14:41   ECHOCARDIOGRAM COMPLETE  Result Date: 05/04/2022    ECHOCARDIOGRAM REPORT   Patient Name:   Russell Hebert Date of Exam: 05/04/2022 Medical Rec #:  [id number]         Height:       67.0 in Accession #:    [id number]        Weight:  171.1 lb Date of Birth:  [birth year]          BSA:          1.892 m Patient Age:    23 years          BP:           160/94 mmHg Patient Gender: M                 HR:           104 bpm. Exam Location:  Inpatient Procedure: 2D Echo, Cardiac Doppler and Color Doppler Indications:    Other abnormalities of the heart  History:        Patient has prior history of Echocardiogram examinations. CAD,                 Prior CABG, TIA; Risk Factors:Hypertension.  Sonographer:    Jyl Heinz Referring Phys: [phone] A CALDWELL POWELL JR  Sonographer Comments: Image acquisition challenging due to uncooperative patient. No  Pedoff- pt would not allow me to take any more images. IMPRESSIONS  1. Left ventricular ejection fraction, by estimation, is 55 to 60%. The left ventricle has normal function. The left ventricle has no regional wall motion abnormalities. Left ventricular diastolic parameters are indeterminate.  2. Right ventricular systolic function is normal. The right ventricular size is normal.  3. The mitral valve is normal in structure. No evidence of mitral valve regurgitation.  4. There is moderate calcification of the aortic valve. Aortic valve regurgitation is not visualized. Mild aortic valve stenosis.  5. 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  6. Unable to visualize. Comparison(s): No significant change from prior study. FINDINGS  Left Ventricle: Left ventricular ejection fraction, by estimation, is 55 to 60%. The left ventricle has normal function. The left ventricle has no regional wall motion abnormalities. The left ventricular internal cavity size was normal in size. There is  no left ventricular hypertrophy. Left ventricular diastolic parameters are indeterminate. Right Ventricle: The right ventricular size is normal. Right ventricular systolic function is normal. Left Atrium: Left atrial size was normal in size. Right Atrium: Right atrial size was normal in size. Pericardium: There is no evidence of pericardial effusion. Mitral Valve: The mitral valve is normal in structure. There is mild calcification of the mitral valve leaflet(s). No evidence of mitral valve regurgitation. Tricuspid Valve: The tricuspid valve is normal in structure. Tricuspid valve regurgitation is not demonstrated. Aortic Valve: There is moderate calcification of the aortic valve. Aortic valve regurgitation is not visualized. Mild aortic stenosis is present. Aortic valve mean gradient measures 13.5 mmHg. Aortic valve peak gradient measures 22.0 mmHg. Aortic valve area, by VTI measures 1.44 cm. Pulmonic Valve: The pulmonic valve was  normal in structure. Pulmonic valve regurgitation is not visualized. Aorta: Small ascending aortic aneurysm 4.2 cm. Venous: Unable to visualize. IAS/Shunts: The interatrial septum was not well visualized.  LEFT VENTRICLE PLAX 2D LVIDd:         4.20 cm      Diastology LVIDs:         3.00 cm      LV e' medial:    5.13 cm/s LV PW:         0.80 cm      LV E/e' medial:  24.6 LV IVS:        0.90 cm      LV e' lateral:   11.80 cm/s LVOT diam:     2.00 cm  LV E/e' lateral: 10.7 LV SV:         54 LV SV Index:   29 LVOT Area:     3.14 cm  LV Volumes (MOD) LV vol d, MOD A2C: 89.2 ml LV vol d, MOD A4C: 106.0 ml LV vol s, MOD A2C: 35.6 ml LV vol s, MOD A4C: 38.0 ml LV SV MOD A2C:     53.6 ml LV SV MOD A4C:     106.0 ml LV SV MOD BP:      60.6 ml RIGHT VENTRICLE RV Basal diam:  3.30 cm RV S prime:     20.40 cm/s TAPSE (M-mode): 1.7 cm LEFT ATRIUM             Index        RIGHT ATRIUM           Index LA diam:        3.20 cm 1.69 cm/m   RA Area:     15.90 cm LA Vol (A2C):   34.2 ml 18.07 ml/m  RA Volume:   46.40 ml  24.52 ml/m LA Vol (A4C):   53.3 ml 28.17 ml/m LA Biplane Vol: 42.8 ml 22.62 ml/m  AORTIC VALVE AV Area (Vmax):    1.54 cm AV Area (Vmean):   1.48 cm AV Area (VTI):     1.44 cm AV Vmax:           234.50 cm/s AV Vmean:          180.000 cm/s AV VTI:            0.374 m AV Peak Grad:      22.0 mmHg AV Mean Grad:      13.5 mmHg LVOT Vmax:         115.00 cm/s LVOT Vmean:        84.900 cm/s LVOT VTI:          0.172 m LVOT/AV VTI ratio: 0.46  AORTA Ao Root diam: 3.30 cm Ao Asc diam:  4.20 cm MITRAL VALVE MV Area (PHT): 5.70 cm     SHUNTS MV Decel Time: 133 msec     Systemic VTI:  0.17 m MV E velocity: 126.00 cm/s  Systemic Diam: 2.00 cm Phineas Inches Electronically signed by Phineas Inches Signature Date/Time: 05/04/2022/10:58:25 AM    Final     Medications: I have reviewed the patient's current medications.  Assessment: Acute pancreatitis-likely from heavy alcohol use and gallstones(normal triglycerides) Impaired  renal function-BUN/creatinine/GFR 32/1.2/more than 60 today, improving Normal LFTs, T. bili/AST/ALT/ALP of 0.8/41/10/50 Hemoglobin 11.8  Chest x-ray concerning for severe multilobar bilateral bronchopneumonia with pleural effusion   Plan: Continue with NG tube feeding, currently receiving vital 1.5 at 20 mL/h. 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 Ronnette Juniper, MD 05/05/2022, 7:51 AM

## 2022-05-05 NOTE — Progress Notes (Signed)
Inpatient Rehab Admissions:  Inpatient Rehab Consult received.  I met with patient at the bedside for rehabilitation assessment and to discuss goals and expectations of an inpatient rehab admission.  Pt was not communicating with AC. NT said pt just finished with bath and was fatigued. Spoke with pt's daughter Arbie Cookey. She acknowledged understanding of CIR goals and expectations. She would prefer a SNF placement for pt. TOC made aware. AC will sign off.  Signed: Gayland Curry, [place], [place] Admissions Coordinator [phone]

## 2022-05-05 NOTE — Progress Notes (Signed)
   05/05/22 0715  Assess: MEWS Score  Temp (!) 97.4 F (36.3 C)  BP (!) 169/99  MAP (mmHg) 121  Pulse Rate (!) 130  Resp 20  Level of Consciousness Alert  SpO2 93 %  O2 Device Room Air  Assess: MEWS Score  MEWS Temp 0  MEWS Systolic 0  MEWS Pulse 3  MEWS RR 0  MEWS LOC 0  MEWS Score 3  MEWS Score Color Yellow  Assess: if the MEWS score is Yellow or Red  Were vital signs taken at a resting state? Yes  Focused Assessment No change from prior assessment  Does the patient meet 2 or more of the SIRS criteria? Yes  Does the patient have a confirmed or suspected source of infection? No  Treat  MEWS Interventions Administered scheduled meds/treatments;Administered prn meds/treatments  Pain Scale 0-10  Pain Score 0  Patients Stated Pain Goal 0  Breathing 0  Negative Vocalization 0  Facial Expression 0  Body Language 0  Consolability 0  PAINAD Score 0  Escalate  MEWS: Escalate Yellow: discuss with charge nurse/RN and consider discussing with provider and RRT  Notify: Charge Nurse/RN  Name of Charge Nurse/RN Notified Zee RN  Date Charge Nurse/RN Notified 05/05/22  Time Charge Nurse/RN Notified [id number]  Notify: Provider  Provider Name/Title Florene Glen  Date Provider Notified 05/05/22  Time Provider Notified 0740  Method of Notification Page  Notification Reason Other (Comment)  Provider response En route;See new orders  Date of Provider Response 05/05/22  Time of Provider Response [id number]  Document  Patient Outcome Stabilized after interventions  Assess: SIRS CRITERIA  SIRS Temperature  0  SIRS Pulse 1  SIRS Respirations  0  SIRS WBC 0  SIRS Score Sum  1

## 2022-05-05 NOTE — Progress Notes (Signed)
PROGRESS NOTE    Russell Russell  AYT:[id number] DOB: Jun 07, 1946 DOA: 04/30/2022 PCP: Alroy Dust, L.Marlou Sa, MD  Chief Complaint  Patient presents with   Back Pain    Brief Narrative:  Russell Russell is [name] 76 y.o. male with medical history significant of parkinsonian syndrome, GERD, hypertension, CAD s/p CABG, history of fall in December resulting in lumbar spine fracture, presented to ED with Russell Russell complaint of back pain.  Lived independently and walked with walker prior to admission.  Drank 3-4 beers 2-3x [name] week, but last beer 4 days piror to admission.  CT with findings concerning for pancreatitis.  Hospital course complicated by delirium and uncontrolled pain.    GI consulted 6/2.   See below for additional details     Assessment & Plan:   Principal Problem:   Pancreatitis Active Problems:   Acute respiratory failure with hypoxia (HCC)   Sinus tachycardia   Acute metabolic encephalopathy   AKI (acute kidney injury) ([place])   Metabolic acidosis   Hypernatremia   Hyperkalemia   Elevated LFTs   Hypotension   Alcohol abuse   Ascending aortic aneurysm (HCC)   Hypoglycemia   Hiatal hernia   Dysphagia   Parkinsonism ([place])   History of recent steroid use   Anxiety   Protein-calorie malnutrition, severe (HCC)   Hypertension   GERD (gastroesophageal reflux disease)   Hx of CABG   Carotid artery disease (HCC)   Hypertensive urgency   Hypokalemia   Leukocytosis   Assessment and Plan: * Pancreatitis CT at presentation with moderate non complicated pancreatitis Repeat CT with progressive acute pancreatitis Given RUQ [place] with cholelithiasis, suspect this is most likely gallstone pancreatitis.  Etoh pancreatitis also on ddx (though he reported to admitting provider not drinking for several days prior to admission - will clarify when he's better able).  Normal triglycerides.  No obvious meds that stand out as cause. Holding IVF at this point with CXR findings 6/2 concerning for  pneumonia, net positive 3.7 L, weight to 77.6 kg from 71.8 (unclear accuracy weights/I&Os) Abdominal exam Zorana Brockwell bit better today, still distended, but less pain cortrak placed 6/2 - trickle feeds started, will advance gradually as tolerated with RD assistance Pain management, bowel regimen Consider GI consult   Acute respiratory failure with hypoxia (HCC) Wheezing and SOB noticed overnight CXR 6/2 with severe multilobar bilateral pneumonia CXR 6/2 PM with cardiomegaly with mild vascular congestion and hazy pulm opacity, possible edema, small L sided effusion continue abx with unasyn for possible pneumonia Follow blood cultures, sputum cx if able, urine strep, MRSA PCR suspect related to volume overload, follow BNP (elevated) and echo (poor quality study, normal EF, indeterminate diastolic parameters) S/p lasix 6/2 x2, will give another dose today and follow response Continue I/O, daily weights  AKI (acute kidney injury) ([place]) CT without hydro Given imaging, likely contrast induced nephropathy, hemodynamically mediated with pancreatitis IVF on hold Lasix given 6/2, again 6/3 Strict I/O, daily weights (weights trending up, net positive)   Acute metabolic encephalopathy Suspect this is related to opiates, acute hospitalization, pain, acute kidney injury etc He's moving all extremities without any appreciated focal deficits - I think everything (slowed/delayed responses/speech/hallucinations) at this time can be attributed to opiates and hospital delirium  ? Of whether this could be related to some etoh withdrawal as well, but per admission hx, doesn't quite line up (unclear how accurate) - hold off on ciwa protocol given seems to be improving on hospital day 4 now Seems slightly  improved today, knew location and month (with self correction).  Still delirious, had mitts placed overnight as he was pulling IV's. Follow with opiates  Delirium precautions High dose thiamine Workup additionally if  persistent or worsening  Sinus tachycardia EKG with sinus tach, possible q in II, aVF appear chronic Seems likely related to pain from pancreatitis, respiratory status Now being diuresed as noted above Echo poor quality, normal EF  Hypernatremia Follow with U44 fluids  Metabolic acidosis resolved  Alcohol abuse Per admission H&P drinks 3-4 beers 2-3x/week, last drink 4 days prior to presentation Hx to GI is that he "drinks heavy alcohol on regular basis"  Thiamine, folate, mvi  Hypotension Related to opiates improved  Elevated LFTs Elevated alk phos and ast, mild Improved, follow  Hyperkalemia S/p lokelma follow  Hypoglycemia Due to poor PO intake D10 fluids, follow  Cortrak, tube feeds started A1c  Ascending aortic aneurysm (HCC) 4.2 cm, needs annual follow up imaging  Dysphagia He's pocketing pills, related to encephalopathy SLP eval, npo for now  Hiatal hernia Esophageal air fluid level suggests dysmotility or GERD PPI daily  Parkinsonism (HCC) sinemet  Protein-calorie malnutrition, severe (HCC) RD, tube feeds  History of recent steroid use Per med rec, he's no longer taking this, will d/c       DVT prophylaxis: lovenox Code Status: DNR Family Communication: discussed with daughter 6/1 and 6/2, no answer 6/3 Disposition:   Status is: Inpatient Remains inpatient appropriate because: additional w/u needed   Consultants:  none  Procedures:  none  Antimicrobials:  Anti-infectives (From admission, onward)    Start     Dose/Rate Route Frequency Ordered Stop   05/04/22 0830  Ampicillin-Sulbactam (UNASYN) 3 g in sodium chloride 0.9 % 100 mL IVPB        3 g 200 mL/hr over 30 Minutes Intravenous Every 6 hours 05/04/22 0740         Subjective: Feels like he's getting better Pain is less More oriented today, knew location and month (self corrected)  Objective: Vitals:   05/04/22 2017 05/04/22 2334 05/05/22 0421 05/05/22 0715  BP:  (!) 160/84 (!) 151/92 (!) 155/84 (!) 169/99  Pulse: (!) 123 (!) 126 (!) 126 (!) 130  Resp: (!) 22 (!) 22 (!) 22 20  Temp: 98.7 F (37.1 C) 98.2 F (36.8 C) 98.7 F (37.1 C) (!) 97.4 F (36.3 C)  TempSrc: Oral Oral Oral Oral  SpO2: (!) 87% 93% 93% 93%  Weight:      Height:        Intake/Output Summary (Last 24 hours) at 05/05/2022 0903 Last data filed at 05/05/2022 0423 Gross per 24 hour  Intake 1112.77 ml  Output 800 ml  Net 312.77 ml   Filed Weights   05/02/22 0436 05/03/22 0500 05/04/22 0600  Weight: 71.8 kg 71.8 kg 77.6 kg    Examination:  General: No acute distress. Cardiovascular: tachycardic, regular Lungs: tachypneic, without clearly adventitious lung sounds on anterior exam Abdomen: distended, less tender today Neurological: Alert and oriented 3 - somewhat difficult to understand at times. Moves all extremities . Cranial nerves II through XII grossly intact. Extremities: No clubbing or cyanosis. No edema.  Data Reviewed: I have personally reviewed following labs and imaging studies  CBC: Recent Labs  Lab 04/02/2022 1352 04/25/2022 1416 05/02/22 0120 05/03/22 0603 05/04/22 0412 05/05/22 0123  WBC 15.5*  --  16.4* 9.5 5.6 5.2  NEUTROABS 14.0*  --   --  8.5* 5.1 4.2  HGB 13.4 14.3 14.9  12.9* 11.2* 11.8*  HCT 43.1 42.0 46.3 40.8 35.2* 36.4*  MCV 92.3  --  90.1 91.9 90.3 88.3  PLT 447*  --  488* 340 249 062    Basic Metabolic Panel: Recent Labs  Lab 04/27/2022 1849 05/02/22 0120 05/03/22 0603 05/03/22 1559 05/04/22 0412 05/04/22 1630 05/05/22 0123  NA  --    < > 141 143 144 144 146*  K  --    < > 5.4* 4.6 4.2 3.6 3.5  CL  --    < > 118* 117* 118* 115* 116*  CO2  --    < > 18* 18* 19* 21* 23  GLUCOSE  --    < > 92 80 64* 81 103*  BUN  --    < > 39* 43* 38* 34* 32*  CREATININE  --    < > 2.51* 2.02* 1.46* 1.29* 1.20  CALCIUM  --    < > 8.0* 7.9* 7.9* 7.9* 8.0*  MG 1.9  --  1.6*  --  2.2  --  2.4  PHOS  --   --  3.7  --  2.8  --  2.1*   < > = values  in this interval not displayed.    GFR: Estimated Creatinine Clearance: 48.2 mL/min (by C-G formula based on SCr of 1.2 mg/dL).  Liver Function Tests: Recent Labs  Lab 05/02/22 2106 05/03/22 0603 05/04/22 0412 05/04/22 1630 05/05/22 0123  AST 34 34 36 42* 41  ALT _0 ALKPHOS 83 72 48 49 50  BILITOT 1.0 1.1 1.4* 1.1 0.8  PROT 4.8* 5.3* 5.2* 5.2* 5.5*  ALBUMIN 2.2* 2.4* 2.1* 2.2* 2.3*    CBG: Recent Labs  Lab 05/04/22 0926 05/04/22 2018 05/04/22 2335 05/05/22 0418 05/05/22 0750  GLUCAP 70 79 108* 114* 129*     Recent Results (from the past 240 hour(s))  Urine Culture     Status: None   Collection Time: 05/02/22 12:41 PM   Specimen: Urine, Clean Catch  Result Value Ref Range Status   Specimen Description URINE, CLEAN CATCH  Final   Special Requests NONE  Final   Culture   Final    NO GROWTH Performed at [facility], [place] [address] [place], [place] [address]    Report Status 05/03/2022 FINAL  Final  MRSA Next Gen by PCR, Nasal     Status: None   Collection Time: 05/04/22  7:28 AM   Specimen: Nasal Mucosa; Nasal Swab  Result Value Ref Range Status   MRSA by PCR Next Gen NOT DETECTED NOT DETECTED Final    Comment: (NOTE) The GeneXpert MRSA Assay (FDA approved for NASAL specimens only), is one component of Keiara Sneeringer comprehensive MRSA colonization surveillance program. It is not intended to diagnose MRSA infection nor to guide or monitor treatment for MRSA infections. Test performance is not FDA approved in patients less than 15 years old. Performed at [facility], [place] [address] [place], [place] [address]   Culture, blood (Routine X 2) w Reflex to ID Panel     Status: None (Preliminary result)   Collection Time: 05/04/22  8:39 AM   Specimen: BLOOD RIGHT FOREARM  Result Value Ref Range Status   Specimen Description BLOOD RIGHT FOREARM  Final   Special Requests   Final    BOTTLES DRAWN AEROBIC AND ANAEROBIC Blood Culture adequate volume    Culture   Final    NO GROWTH < 24 HOURS Performed  at [facility], [place] [address], [place], [place] [address]    Report Status PENDING  Incomplete  Culture, blood (Routine X 2) w Reflex to ID Panel     Status: None (Preliminary result)   Collection Time: 05/04/22  8:49 AM   Specimen: BLOOD RIGHT WRIST  Result Value Ref Range Status   Specimen Description BLOOD RIGHT WRIST  Final   Special Requests   Final    BOTTLES DRAWN AEROBIC AND ANAEROBIC Blood Culture adequate volume   Culture   Final    NO GROWTH < 24 HOURS Performed at [facility], [place] [address] [place], [place] [address]    Report Status PENDING  Incomplete         Radiology Studies: DG Abd 1 View  Result Date: 05/04/2022 CLINICAL DATA:  Epigastric abdominal pain.  Nausea. EXAM: ABDOMEN - 1 VIEW COMPARISON:  CT, 05/02/2022. FINDINGS: No bowel dilation to suggest obstruction. Aortic, iliac and femoral artery vascular calcifications. No evidence of renal or ureteral stones. No acute skeletal abnormality. IMPRESSION: 1. No evidence of bowel obstruction. 2. Findings of pancreatitis noted on the recent prior CT are not visualized radiographically. Electronically Signed   By: Lajean Manes M.D.   On: 05/04/2022 13:00   DG CHEST PORT 1 VIEW  Result Date: 05/04/2022 CLINICAL DATA:  Shortness of breath EXAM: PORTABLE CHEST 1 VIEW COMPARISON:  05/04/2022, CT 04/21/2022, FINDINGS: Post sternotomy changes. Esophageal tube is looped at the lower chest within Lucien Budney hiatal hernia, tip below the diaphragm and probably within the distal stomach. Cardiomegaly with vascular congestion and hazy lung fields likely due to mild edema. There is Raynelle Fujikawa small left-sided effusion. IMPRESSION: 1. Cardiomegaly with mild vascular congestion and hazy pulmonary opacity possible edema. Small left-sided effusion. 2. Esophageal tube is looped at the lower chest within Macaulay Reicher hiatal hernia with the tip position below the diaphragm and probably within the distal  stomach. Electronically Signed   By: Donavan Foil M.D.   On: 05/04/2022 21:23   DG CHEST PORT 1 VIEW  Result Date: 05/04/2022 CLINICAL DATA:  76 year old male with history of shortness of breath, cough and congestion. EXAM: PORTABLE CHEST 1 VIEW COMPARISON:  Chest x-ray 11/29/2008. FINDINGS: Lung volumes are low. Patchy multifocal areas of interstitial prominence and airspace consolidation are noted throughout the lungs bilaterally (left-greater-than-right), most confluent at the left lung base. Small left pleural effusion. No right pleural effusion. No pneumothorax. No evidence of pulmonary edema. Heart size appears borderline enlarged. The patient is rotated to the left on today's exam, resulting in distortion of the mediastinal contours and reduced diagnostic sensitivity and specificity for mediastinal pathology. Atherosclerotic calcifications in the thoracic aorta. Status post median sternotomy for CABG. IMPRESSION: 1. The appearance the chest is most concerning for severe multilobar bilateral bronchopneumonia, most confluent in the left lower lobe. 2. Small left parapneumonic pleural effusion. 3. Aortic atherosclerosis. Electronically Signed   By: Vinnie Langton M.D.   On: 05/04/2022 07:20   DG Abd Portable 1V  Result Date: 05/04/2022 CLINICAL DATA:  [id number].  Feeding tube placement EXAM: PORTABLE ABDOMEN - 1 VIEW COMPARISON:  X-ray abdomen 62/2/23 2:25 p.m., CT abdomen pelvis 05/02/2022, CT abdomen pelvis 04/22/2022 FINDINGS: Hiatal hernia. Interval advancement of an enteric tube with tip overlying the expected region of the gastric lumen. The bowel gas pattern is normal. No radio-opaque calculi or other significant radiographic abnormality are seen. Aortic calcification. IMPRESSION: 1. Enteric tube with tip overlying the expected region of the gastric  lumen. 2. Hiatal hernia. 3.  Aortic Atherosclerosis (ICD10-I70.0). Electronically Signed   By: Iven Finn M.D.   On: 05/04/2022 15:51   DG Abd  Portable 1V  Result Date: 05/04/2022 CLINICAL DATA:  Feeding tube placement. EXAM: PORTABLE ABDOMEN - 1 VIEW COMPARISON:  05/04/2022 at 12:32 p.m. FINDINGS: Enteric tube tip projects in the medial left upper quadrant consistent with positioning at or just below the expected location of the gastroesophageal junction. Normal bowel gas pattern. IMPRESSION: 1. Enteric feeding tube tip lies at the expected location of the gastroesophageal junction. This will need to be further inserted, at least 20 cm, to allow adequate length for the tube to into the duodenum. Electronically Signed   By: Lajean Manes M.D.   On: 05/04/2022 14:41   ECHOCARDIOGRAM COMPLETE  Result Date: 05/04/2022    ECHOCARDIOGRAM REPORT   Patient Name:   Russell Russell Date of Exam: 05/04/2022 Medical Rec #:  [id number]         Height:       67.0 in Accession #:    [id number]        Weight:       171.1 lb Date of Birth:  04/01/1946          BSA:          1.892 m Patient Age:    89 years          BP:           160/94 mmHg Patient Gender: M                 HR:           104 bpm. Exam Location:  Inpatient Procedure: 2D Echo, Cardiac Doppler and Color Doppler Indications:    Other abnormalities of the heart  History:        Patient has prior history of Echocardiogram examinations. CAD,                 Prior CABG, TIA; Risk Factors:Hypertension.  Sonographer:    Jyl Heinz Referring Phys: [phone] Jaeleen Inzunza CALDWELL POWELL JR  Sonographer Comments: Image acquisition challenging due to uncooperative patient. No Pedoff- pt would not allow me to take any more images. IMPRESSIONS  1. Left ventricular ejection fraction, by estimation, is 55 to 60%. The left ventricle has normal function. The left ventricle has no regional wall motion abnormalities. Left ventricular diastolic parameters are indeterminate.  2. Right ventricular systolic function is normal. The right ventricular size is normal.  3. The mitral valve is normal in structure. No evidence of mitral valve  regurgitation.  4. There is moderate calcification of the aortic valve. Aortic valve regurgitation is not visualized. Mild aortic valve stenosis.  5. 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  6. Unable to visualize. Comparison(s): No significant change from prior study. FINDINGS  Left Ventricle: Left ventricular ejection fraction, by estimation, is 55 to 60%. The left ventricle has normal function. The left ventricle has no regional wall motion abnormalities. The left ventricular internal cavity size was normal in size. There is  no left ventricular hypertrophy. Left ventricular diastolic parameters are indeterminate. Right Ventricle: The right ventricular size is normal. Right ventricular systolic function is normal. Left Atrium: Left atrial size was normal in size. Right Atrium: Right atrial size was normal in size. Pericardium: There is no evidence of pericardial effusion. Mitral Valve: The mitral valve is normal in structure. There is mild calcification of the mitral valve leaflet(s). No  evidence of mitral valve regurgitation. Tricuspid Valve: The tricuspid valve is normal in structure. Tricuspid valve regurgitation is not demonstrated. Aortic Valve: There is moderate calcification of the aortic valve. Aortic valve regurgitation is not visualized. Mild aortic stenosis is present. Aortic valve mean gradient measures 13.5 mmHg. Aortic valve peak gradient measures 22.0 mmHg. Aortic valve area, by VTI measures 1.44 cm. Pulmonic Valve: The pulmonic valve was normal in structure. Pulmonic valve regurgitation is not visualized. Aorta: Small ascending aortic aneurysm 4.2 cm. Venous: Unable to visualize. IAS/Shunts: The interatrial septum was not well visualized.  LEFT VENTRICLE PLAX 2D LVIDd:         4.20 cm      Diastology LVIDs:         3.00 cm      LV e' medial:    5.13 cm/s LV PW:         0.80 cm      LV E/e' medial:  24.6 LV IVS:        0.90 cm      LV e' lateral:   11.80 cm/s LVOT diam:     2.00 cm      LV  E/e' lateral: 10.7 LV SV:         54 LV SV Index:   29 LVOT Area:     3.14 cm  LV Volumes (MOD) LV vol d, MOD A2C: 89.2 ml LV vol d, MOD A4C: 106.0 ml LV vol s, MOD A2C: 35.6 ml LV vol s, MOD A4C: 38.0 ml LV SV MOD A2C:     53.6 ml LV SV MOD A4C:     106.0 ml LV SV MOD BP:      60.6 ml RIGHT VENTRICLE RV Basal diam:  3.30 cm RV S prime:     20.40 cm/s TAPSE (M-mode): 1.7 cm LEFT ATRIUM             Index        RIGHT ATRIUM           Index LA diam:        3.20 cm 1.69 cm/m   RA Area:     15.90 cm LA Vol (A2C):   34.2 ml 18.07 ml/m  RA Volume:   46.40 ml  24.52 ml/m LA Vol (A4C):   53.3 ml 28.17 ml/m LA Biplane Vol: 42.8 ml 22.62 ml/m  AORTIC VALVE AV Area (Vmax):    1.54 cm AV Area (Vmean):   1.48 cm AV Area (VTI):     1.44 cm AV Vmax:           234.50 cm/s AV Vmean:          180.000 cm/s AV VTI:            0.374 m AV Peak Grad:      22.0 mmHg AV Mean Grad:      13.5 mmHg LVOT Vmax:         115.00 cm/s LVOT Vmean:        84.900 cm/s LVOT VTI:          0.172 m LVOT/AV VTI ratio: 0.46  AORTA Ao Root diam: 3.30 cm Ao Asc diam:  4.20 cm MITRAL VALVE MV Area (PHT): 5.70 cm     SHUNTS MV Decel Time: 133 msec     Systemic VTI:  0.17 m MV E velocity: 126.00 cm/s  Systemic Diam: 2.00 cm Phineas Inches Electronically signed by Phineas Inches Signature Date/Time: 05/04/2022/10:58:25 AM  Final         Scheduled Meds:  carbidopa-levodopa  1 tablet Oral TID   clopidogrel  75 mg Oral Daily   enoxaparin (LOVENOX) injection  40 mg Subcutaneous Q24H   feeding supplement (VITAL 1.5 CAL)  1,000 mL Per Tube S13G   folic acid  1 mg Oral Daily   furosemide  40 mg Intravenous Once   multivitamin with minerals  1 tablet Oral Daily   pantoprazole (PROTONIX) IV  40 mg Intravenous Q24H   polyethylene glycol  17 g Oral BID   [START ON 05/13/2022] thiamine injection  100 mg Intravenous Daily   Continuous Infusions:  ampicillin-sulbactam (UNASYN) IV 3 g (05/05/22 0825)   dextrose 50 mL/hr at 05/04/22 2200   thiamine  injection     Followed by   Derrill Memo ON 05/08/2022] thiamine injection       LOS: 4 days    Time spent: over 63 min    Fayrene Helper, MD [facility]   To contact the attending provider between 7A-7P or the covering provider during after hours 7P-7A, please log into the web site [email or web address] and access using universal [place] password for that web site. If you do not have the password, please call the hospital operator.  05/05/2022, 9:03 AM

## 2022-05-05 NOTE — Evaluation (Signed)
Clinical/Bedside Swallow Evaluation Patient Details  Name: Russell Hebert MRN: [id number] Date of Birth: 12-15-1945  Today's Date: 05/05/2022 Time: SLP Start Time (ACUTE ONLY): 52 SLP Stop Time (ACUTE ONLY): 1050 SLP Time Calculation (min) (ACUTE ONLY): 15 min  Past Medical History:  Past Medical History:  Diagnosis Date   Carotid artery disease ([place])    Doppler, December, 2010, 0-39% bilateral   Coronary artery disease    Acute lateral MI October, 2008, bare-metal stent circumflex /  catheterization November, 2009, LIMA-LAD patent,, all vein grafts patent OM stent patent... some sluggish distal flow... 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   HPI:  76 y.o. male admitted with pancreatitis, acute respiratory failure with hypoxia, multilobar pna, AKI, encephalopathy. Cortrak 6/2. PMHx parkinsonian syndrome, GERD, hypertension, CAD s/p CABG, history of fall in December resulting in lumbar spine fracture.    Assessment / Plan / Recommendation  Clinical Impression  Russell Hebert participated in a bedside swallow evaluation. He was confused; talking minimally.  Followed commands intermittently.  Mental status was the primary barrier to safe eating. He had difficulty f/c to drink through a straw, intermittently blowing out rather than drawing liquid in. Thin liquids elicited a cough response >50% of the time, concerning for aspiration. Cough was  eliminated with nectar thick liquids. He tolerated purees/applesauce without difficulty.  No regular solids were offered given current mental status and absence of dentures. Has cortrak for nutrition. Recommend starting a dysphagia 1 diet with nectar thick liquids in addition to TF. Pt will need assistance with feeding at this time. May give meds whole in puree.  SLP will follow - anticipate improvements as confusion clears. SLP Visit Diagnosis: Dysphagia, oropharyngeal phase (R13.12)    Aspiration Risk  Mild aspiration risk    Diet Recommendation   Dysphagia 1/nectar thick liquids  Medication Administration: Whole meds with puree    Other  Recommendations Oral Care Recommendations: Oral care BID    Recommendations for follow up therapy are one component of a multi-disciplinary discharge planning process, led by the attending physician.  Recommendations may be updated based on patient status, additional functional criteria and insurance authorization.  Follow up Recommendations No SLP follow up          Functional Status Assessment Patient has had a recent decline in their functional status and demonstrates the ability to make significant improvements in function in a reasonable and predictable amount of time.  Frequency and Duration min 2x/week          Prognosis Prognosis for Safe Diet Advancement: Good Barriers to Reach Goals: Cognitive deficits      Swallow Study   General HPI: 76 y.o. male admitted with pancreatitis, acute respiratory failure with hypoxia, multilobar pna, AKI, encephalopathy. Cortrak 6/2. PMHx parkinsonian syndrome, GERD, hypertension, CAD s/p CABG, history of fall in December resulting in lumbar spine fracture. Type of Study: Bedside Swallow Evaluation Previous Swallow Assessment:  no Diet Prior to this Study: NPO;NG Tube Temperature Spikes Noted: No Respiratory Status: Nasal cannula History of Recent Intubation: No Behavior/Cognition: Alert;Confused Oral  Cavity Assessment: Within Functional Limits Oral Care Completed by SLP: Recent completion by staff Oral Cavity - Dentition: Edentulous Self-Feeding Abilities: Total assist Patient Positioning: Upright in bed Baseline Vocal Quality: Low vocal intensity Volitional Cough: Cognitively unable to elicit Volitional Swallow: Unable to elicit    Oral/Motor/Sensory Function Overall Oral Motor/Sensory Function: Within functional limits   Ice Chips Ice chips: Within functional limits   Thin Liquid Thin Liquid: Impaired Presentation: Straw Oral Phase Functional Implications: Prolonged oral transit Pharyngeal  Phase Impairments: Cough - Immediate    Nectar Thick Nectar Thick Liquid: Within functional limits   Honey Thick Honey Thick Liquid: Not tested   Puree Puree: Within functional limits   Solid     Solid: Not tested      Russell Hebert 05/05/2022,10:55 AM   Russell Hebert, [place] Office number [phone] Pager [phone]

## 2022-05-05 NOTE — Progress Notes (Signed)
OT Cancellation Note  Patient Details Name: Russell Hebert MRN: [id number] DOB: 1946-11-01   Cancelled Treatment:    Reason Eval/Treat Not Completed: Fatigue/lethargy limiting ability to participate.  Patient had just competed bedlevel bath with NT, tachycardic and fatigued.  OT can continue efforts as appropriate.    Felicia Both D [name] 05/05/2022, 3:55 PM

## 2022-05-05 NOTE — Assessment & Plan Note (Addendum)
Per admission H&P drinks 3-4 beers 2-3x/week, last drink 4 days prior to presentation Hx to GI is that he "drinks heavy alcohol on regular basis"  Thiamine, folate, mvi

## 2022-05-05 NOTE — Progress Notes (Signed)
Inpatient Rehab Admissions Coordinator Note:   Per PT patient was screened for CIR candidacy by Jazilyn Siegenthaler Danford Bad, CCC-SLP. At this time, pt appears to be a potential candidate for CIR. I will place an order for rehab consult for full assessment, per our protocol.  Please contact me any with questions.Gayland Curry, [place], [place] Admissions Coordinator [phone] 05/05/22 1:50 PM

## 2022-05-05 NOTE — Assessment & Plan Note (Addendum)
SLP eval, dysphagia 1/nectar thickened liquids

## 2022-05-05 NOTE — Evaluation (Signed)
Physical Therapy Evaluation Patient Details Name: DESHAUN WEISINGER MRN: [id number] DOB: 04/30/1946 Today's Date: 05/05/2022  History of Present Illness  76 y.o. male admitted with pancreatitis, acute respiratory failure with hypoxia, multilobar pna, AKI, encephalopathy. Cortrak 6/2. PMHx parkinsonian syndrome, GERD, hypertension, CAD s/p CABG, history of fall in December resulting in lumbar spine fracture.  Clinical Impression  Pt admitted with above diagnosis. Very motivated, min-mod assist for bed mobility, able to stand EOB with min assist, showing posterior loss of balance and assist required to take several small steps along bed with RW. HR 120s at rest, up to 130 while standing SpO2 mid 90s with and without supplemental O2 for a couple of minutes. Dyspneic at rest and with exertion. Denies pain. Reportedly independent at baseline with use of a rollator and reports he would do his shopping and driving PTA. Pt currently with functional limitations due to the deficits listed below (see PT Problem List). Pt will benefit from skilled PT to increase their independence and safety with mobility to allow discharge to the venue listed below.          Recommendations for follow up therapy are one component of a multi-disciplinary discharge planning process, led by the attending physician.  Recommendations may be updated based on patient status, additional functional criteria and insurance authorization.  Follow Up Recommendations Acute inpatient rehab (3hours/day)    Assistance Recommended at Discharge Frequent or constant Supervision/Assistance  Patient can return home with the following  Two people to help with walking and/or transfers;Two people to help with bathing/dressing/bathroom;Assistance with cooking/housework;Assistance with feeding;Direct supervision/assist for medications management;Direct supervision/assist for financial management;Assist for transportation;Help with stairs or ramp for  entrance    Equipment Recommendations  (TBD next venue of care)  Recommendations for Other Services  Rehab consult;OT consult    Functional Status Assessment Patient has had a recent decline in their functional status and demonstrates the ability to make significant improvements in function in a reasonable and predictable amount of time.     Precautions / Restrictions Precautions Precautions: Fall Restrictions Weight Bearing Restrictions: No      Mobility  Bed Mobility Overal bed mobility: Needs Assistance Bed Mobility: Rolling, Sidelying to Sit, Sit to Sidelying Rolling: Min guard Sidelying to sit: Min assist     Sit to sidelying: Min assist, Mod assist General bed mobility comments: Educated on bed mobility techniques, able to roll onto left side utilizing rail for support, cues for technique with rise to EOB min assist for trunk support and balance, Mod assist for LE support and max VC returning to bed.    Transfers Overall transfer level: Needs assistance Equipment used: Rolling walker (2 wheels) Transfers: Sit to/from Stand Sit to Stand: Mod assist           General transfer comment: Mod assist for first trial of standing from EOB with RW for support upon rising. Cues for hand placement on bed and anterior weight shift. Showing fair amoutn of posterior lean requiring assist to remain standing. Second trial patient tolerated with min assist but still required light support to remain balance with RW. Capable of taking several small side steps along bed with min assist.    Ambulation/Gait             Pre-gait activities: Weight shifting, standing with RW, side steps along bed.    Stairs            Wheelchair Mobility    Modified Rankin (Stroke Patients Only)  Balance                                             Pertinent Vitals/Pain Pain Assessment Pain Assessment: No/denies pain    Home Living Family/patient expects  to be discharged to:: Private residence Living Arrangements: Alone Available Help at Discharge: Family;Available PRN/intermittently Type of Home: House Home Access: Stairs to enter Entrance Stairs-Rails: Right;Left;Can reach both Entrance Stairs-Number of Steps: 3 (3 front 6 back)   Home Layout: One level Home Equipment: Conservation officer, nature (2 wheels);Rollator (4 wheels);Cane - quad;Tub bench;BSC/3in1;Wheelchair - manual      Prior Function Prior Level of Function : Independent/Modified Independent;History of Falls (last six months)             Mobility Comments: pt reports he drives and primarily uses his rollator when he goes out of the home. Also uses rollator in home ADLs Comments: Pt reports full independence with ADLs     Hand Dominance   Dominant Hand: Right    Extremity/Trunk Assessment   Upper Extremity Assessment Upper Extremity Assessment: Defer to OT evaluation    Lower Extremity Assessment Lower Extremity Assessment: Generalized weakness       Communication   Communication: No difficulties  Cognition Arousal/Alertness: Awake/alert Behavior During Therapy: WFL for tasks assessed/performed Overall Cognitive Status: Impaired/Different from baseline Area of Impairment: Orientation, Following commands, Problem solving                 Orientation Level: Disoriented to, Place, Situation (Recalls month/year but had to correct himself)     Following Commands: Follows one step commands inconsistently, Follows one step commands with increased time     Problem Solving: Slow processing, Difficulty sequencing, Requires verbal cues          General Comments General comments (skin integrity, edema, etc.): Resting HR 123 SpO2 97% on 3L; HR up to 130 while standing, back to 125 end of session. SpO2 95% on room air for a couple of minutes. Moderate dyspnea at rest and with exertion.    Exercises General Exercises - Lower Extremity Ankle Circles/Pumps: AROM,  Both, 10 reps, Seated Quad Sets: Strengthening, Both, 10 reps, Seated Gluteal Sets: Strengthening, Both, 10 reps, Seated   Assessment/Plan    PT Assessment Patient needs continued PT services  PT Problem List Decreased strength;Decreased activity tolerance;Decreased balance;Decreased mobility;Decreased coordination;Decreased cognition;Decreased knowledge of use of DME;Decreased safety awareness;Cardiopulmonary status limiting activity       PT Treatment Interventions DME instruction;Gait training;Functional mobility training;Therapeutic activities;Therapeutic exercise;Balance training;Neuromuscular re-education;Cognitive remediation;Patient/family education    PT Goals (Current goals can be found in the Care Plan section)  Acute Rehab PT Goals Patient Stated Goal: Get well PT Goal Formulation: With patient Time For Goal Achievement: 05/19/22 Potential to Achieve Goals: Good    Frequency Min 3X/week     Co-evaluation               AM-PAC PT "6 Clicks" Mobility  Outcome Measure Help needed turning from your back to your side while in a flat bed without using bedrails?: A Little Help needed moving from lying on your back to sitting on the side of a flat bed without using bedrails?: A Little Help needed moving to and from a bed to a chair (including a wheelchair)?: A Lot Help needed standing up from a chair using your arms (e.g., wheelchair or bedside chair)?:  A Lot Help needed to walk in hospital room?: A Lot Help needed climbing 3-5 steps with a railing? : Total 6 Click Score: 13    End of Session Equipment Utilized During Treatment: Gait belt;Oxygen Activity Tolerance: Patient tolerated treatment well;Treatment limited secondary to medical complications (Comment) (tachycardia) Patient left: in bed;with call bell/phone within reach;with bed alarm set;with nursing/sitter in room;with restraints reapplied (Feeding tube on. (mitts on)) Nurse Communication: Mobility status  (tachycardia, denies pain) PT Visit Diagnosis: Unsteadiness on feet (R26.81);Muscle weakness (generalized) (M62.81);History of falling (Z91.81);Difficulty in walking, not elsewhere classified (R26.2)    Time: 2481-8590 PT Time Calculation (min) (ACUTE ONLY): 33 min   Charges:   PT Evaluation $PT Eval Moderate Complexity: 1 Mod PT Treatments $Therapeutic Activity: 8-22 mins        Candie Mile, PT   Ellouise Newer 05/05/2022, 1:24 PM

## 2022-05-05 NOTE — Assessment & Plan Note (Signed)
RD, tube feeds

## 2022-05-06 ENCOUNTER — Inpatient Hospital Stay (HOSPITAL_COMMUNITY): Payer: Medicare PPO

## 2022-05-06 DIAGNOSIS — A419 Sepsis, unspecified organism: Secondary | ICD-10-CM | POA: Insufficient documentation

## 2022-05-06 DIAGNOSIS — R6510 Systemic inflammatory response syndrome (SIRS) of non-infectious origin without acute organ dysfunction: Secondary | ICD-10-CM

## 2022-05-06 DIAGNOSIS — K859 Acute pancreatitis without necrosis or infection, unspecified: Secondary | ICD-10-CM | POA: Diagnosis not present

## 2022-05-06 LAB — COMPREHENSIVE METABOLIC PANEL
ALT: 19 U/L (ref 0–44)
AST: 34 U/L (ref 15–41)
Albumin: 1.8 g/dL — ABNORMAL LOW (ref 3.5–5.0)
Alkaline Phosphatase: 43 U/L (ref 38–126)
Anion gap: 8 (ref 5–15)
BUN: 27 mg/dL — ABNORMAL HIGH (ref 8–23)
CO2: 26 mmol/L (ref 22–32)
Calcium: 7.6 mg/dL — ABNORMAL LOW (ref 8.9–10.3)
Chloride: 115 mmol/L — ABNORMAL HIGH (ref 98–111)
Creatinine, Ser: 1.15 mg/dL (ref 0.61–1.24)
GFR, Estimated: >60 mL/min (ref >60)
Glucose, Bld: 142 mg/dL — ABNORMAL HIGH (ref 70–99)
Potassium: 3.2 mmol/L — ABNORMAL LOW (ref 3.5–5.1)
Sodium: 149 mmol/L — ABNORMAL HIGH (ref 135–145)
Total Bilirubin: 0.8 mg/dL (ref 0.3–1.2)
Total Protein: 5.2 g/dL — ABNORMAL LOW (ref 6.5–8.1)

## 2022-05-06 LAB — CBC WITH DIFFERENTIAL/PLATELET
Abs Immature Granulocytes: 0 10*3/uL (ref 0.00–0.07)
Basophils Absolute: 0 10*3/uL (ref 0.0–0.1)
Basophils Relative: 0 %
Eosinophils Absolute: 0 10*3/uL (ref 0.0–0.5)
Eosinophils Relative: 0 %
HCT: 34.2 % — ABNORMAL LOW (ref 39.0–52.0)
Hemoglobin: 11.3 g/dL — ABNORMAL LOW (ref 13.0–17.0)
Lymphocytes Relative: 16 %
Lymphs Abs: 1 10*3/uL (ref 0.7–4.0)
MCH: 28.6 pg (ref 26.0–34.0)
MCHC: 33 g/dL (ref 30.0–36.0)
MCV: 86.6 fL (ref 80.0–100.0)
Monocytes Absolute: 0.8 10*3/uL (ref 0.1–1.0)
Monocytes Relative: 14 %
Neutro Abs: 4.2 10*3/uL (ref 1.7–7.7)
Neutrophils Relative %: 70 %
Platelets: 206 10*3/uL (ref 150–400)
RBC: 3.95 MIL/uL — ABNORMAL LOW (ref 4.22–5.81)
RDW: 15.3 % (ref 11.5–15.5)
WBC: 6 10*3/uL (ref 4.0–10.5)
nRBC: 0 % (ref 0.0–0.2)
nRBC: 0 /100 WBC

## 2022-05-06 LAB — GLUCOSE, CAPILLARY
Glucose-Capillary: 119 mg/dL — ABNORMAL HIGH (ref 70–99)
Glucose-Capillary: 132 mg/dL — ABNORMAL HIGH (ref 70–99)
Glucose-Capillary: 145 mg/dL — ABNORMAL HIGH (ref 70–99)
Glucose-Capillary: 160 mg/dL — ABNORMAL HIGH (ref 70–99)
Glucose-Capillary: 178 mg/dL — ABNORMAL HIGH (ref 70–99)

## 2022-05-06 LAB — BASIC METABOLIC PANEL
Anion gap: 9 (ref 5–15)
BUN: 26 mg/dL — ABNORMAL HIGH (ref 8–23)
CO2: 22 mmol/L (ref 22–32)
Calcium: 7.3 mg/dL — ABNORMAL LOW (ref 8.9–10.3)
Chloride: 113 mmol/L — ABNORMAL HIGH (ref 98–111)
Creatinine, Ser: 0.95 mg/dL (ref 0.61–1.24)
GFR, Estimated: >60 mL/min (ref >60)
Glucose, Bld: 113 mg/dL — ABNORMAL HIGH (ref 70–99)
Potassium: 3.9 mmol/L (ref 3.5–5.1)
Sodium: 144 mmol/L (ref 135–145)

## 2022-05-06 LAB — LACTIC ACID, PLASMA
Lactic Acid, Venous: 1.9 mmol/L (ref 0.5–1.9)
Lactic Acid, Venous: 2.1 mmol/L (ref 0.5–1.9)
Lactic Acid, Venous: 1.7 mmol/L (ref 0.5–1.9)

## 2022-05-06 LAB — SARS CORONAVIRUS 2 BY RT PCR: SARS Coronavirus 2 by RT PCR: NEGATIVE

## 2022-05-06 LAB — MAGNESIUM: Magnesium: 2.1 mg/dL (ref 1.7–2.4)

## 2022-05-06 LAB — BRAIN NATRIURETIC PEPTIDE: B Natriuretic Peptide: 162.9 pg/mL — ABNORMAL HIGH (ref 0.0–100.0)

## 2022-05-06 LAB — PHOSPHORUS: Phosphorus: 1.7 mg/dL — ABNORMAL LOW (ref 2.5–4.6)

## 2022-05-06 MED ORDER — METOPROLOL TARTRATE 12.5 MG HALF TABLET
12.5000 mg | ORAL_TABLET | Freq: Once | ORAL | Status: AC
  Administered 2022-05-06: 12.5 mg via ORAL
  Filled 2022-05-06: qty 1

## 2022-05-06 MED ORDER — FUROSEMIDE 10 MG/ML IJ SOLN: 40.0000 mg | Freq: Once | INTRAMUSCULAR | Status: DC

## 2022-05-06 MED ORDER — POTASSIUM CHLORIDE CRYS ER 20 MEQ PO TBCR
40.0000 meq | EXTENDED_RELEASE_TABLET | Freq: Once | ORAL | Status: AC
Start: 2022-05-06 — End: 2022-05-06
  Administered 2022-05-06: 40 meq via ORAL
  Filled 2022-05-06: qty 2

## 2022-05-06 MED ORDER — PIPERACILLIN-TAZOBACTAM 3.375 G IVPB
3.3750 g | Freq: Three times a day (TID) | INTRAVENOUS | Status: DC
  Administered 2022-05-06 – 2022-05-08 (×8): 3.375 g via INTRAVENOUS
  Filled 2022-05-06 (×10): qty 50

## 2022-05-06 MED ORDER — METOPROLOL TARTRATE 25 MG PO TABS
25.0000 mg | ORAL_TABLET | Freq: Two times a day (BID) | ORAL | Status: DC
  Administered 2022-05-06 – 2022-05-09 (×6): 25 mg via ORAL
  Filled 2022-05-06 (×6): qty 1

## 2022-05-06 MED ORDER — POTASSIUM CL IN DEXTROSE 5% 20 MEQ/L IV SOLN
20.0000 meq | INTRAVENOUS | Status: DC
  Filled 2022-05-06: qty 1000

## 2022-05-06 MED ORDER — IOHEXOL 300 MG/ML  SOLN
100.0000 mL | Freq: Once | INTRAMUSCULAR | Status: AC | PRN
Start: 2022-05-06 — End: 2022-05-06
  Administered 2022-05-06: 100 mL via INTRAVENOUS

## 2022-05-06 MED ORDER — POTASSIUM PHOSPHATES 15 MMOLE/5ML IV SOLN
30.0000 mmol | Freq: Once | INTRAVENOUS | Status: AC
  Administered 2022-05-06: 30 mmol via INTRAVENOUS
  Filled 2022-05-06: qty 10

## 2022-05-06 MED ORDER — FUROSEMIDE 10 MG/ML IJ SOLN
40.0000 mg | Freq: Once | INTRAMUSCULAR | Status: AC
  Administered 2022-05-06: 40 mg via INTRAVENOUS
  Filled 2022-05-06: qty 4

## 2022-05-06 MED ORDER — IOHEXOL 9 MG/ML PO SOLN
ORAL | Status: AC
  Administered 2022-05-06: 500 mL
  Filled 2022-05-06: qty 1000

## 2022-05-06 MED ORDER — POTASSIUM CL IN DEXTROSE 5% 20 MEQ/L IV SOLN
20.0000 meq | INTRAVENOUS | Status: DC
  Administered 2022-05-06 (×2): 20 meq via INTRAVENOUS
  Filled 2022-05-06 (×2): qty 1000

## 2022-05-06 MED ORDER — DEXTROSE 5 % IV SOLN: INTRAVENOUS | Status: DC

## 2022-05-06 MED ORDER — ORAL CARE MOUTH RINSE
15.0000 mL | Freq: Two times a day (BID) | OROMUCOSAL | Status: DC
  Administered 2022-05-06 – 2022-05-09 (×7): 15 mL via OROMUCOSAL

## 2022-05-06 NOTE — Assessment & Plan Note (Addendum)
Fever, tachycardia, tachypnea.  Leukocytosis developing today. Related to pneumonia vs pancreatitis  Blood cultures NGTD, repeat (prior from 6/2 NGTD) UA negative nitrite, LE CT chest (ordered 6/5, not done until today when ordered stat) today with moderate effusions with atelectasis in the lower lobes, upper abdominal inflammation corresponding to known acute pancreatitis CT abd/pelvis without sig change in severe acute pancreatitis, retroperitoneal fluid tracking inferiorly within R anterior and posterior pararenal spaces, increased moderate bilateral effusions and dependent lower lobe atelectasis covid negative Negative urine strep, negative MRSA PCR unasyn 6/2 - 6/3 abx broadened to zosyn (6/4-present).  Still with persistent fevers.  Planning for thoracentesis.  7:01 PM addendum thora with gram negative rods, he's been broadened to meropenem, repeat CXR low volume with probably tiny L effusion.  Discussed with pulm, they'll have someone see him tonight.

## 2022-05-06 NOTE — Progress Notes (Signed)
Pharmacy Antibiotic Note- follow-up  Russell Hebert is a 76 y.o. male admitted on 04/30/2022 with back and abdominal pain, found to have acute gallstone pancreatitis.  Pharmacy has been consulted for discontinuation of Unasyn and starting Zosyn for HCAP.  CXR concerning for severe multilobar bilateral bronchopneumonia.  Renal function improving, afebrile, WBC WNL.  Plan: Discontinued Unasyn Start Zosyn 3.375 gm iv EI q8h  Height: '5\' 7"'$  (170.2 cm) Weight: 71.4 kg (157 lb 6.5 oz) IBW/kg (Calculated) : 66.1  Temp (24hrs), Avg:99.8 F (37.7 C), Min:98.1 F (36.7 C), Max:101.9 F (38.8 C)  Recent Labs  Lab 05/02/22 0120 05/02/22 2106 05/03/22 0603 05/03/22 1559 05/04/22 0412 05/04/22 1630 05/05/22 0123 05/05/22 1554 05/06/22 0109 05/06/22 0605  WBC 16.4*  --  9.5  --  5.6  --  5.2  --  6.0  --   CREATININE 1.02   < > 2.51*   < > 1.46* 1.29* 1.20 1.10 1.15  --   LATICACIDVEN  --   --   --   --   --   --   --   --   --  1.9   < > = values in this interval not displayed.     Estimated Creatinine Clearance: 50.3 mL/min (by C-G formula based on SCr of 1.15 mg/dL).    Allergies  Allergen Reactions   Morphine Anaphylaxis   Celebrex [Celecoxib] Diarrhea    Unasyn 6/2 >>6/4 Zosyn 6/4 >>  5/31 UCx - negative 5/31 BCx - ngtd [D3] 5/31 MRSA PCR - negative  Harshith Pursell BS, PharmD, BCPS Clinical Pharmacist 05/06/2022 8:20 AM  Contact: [phone] after 3 PM  "Be curious, not judgmental..." -Jamal Maes

## 2022-05-06 NOTE — Progress Notes (Signed)
    RE:  Russell Hebert       Date of Birth:  12/23/1945     Date:   05/06/22       To Whom It May Concern:  Please be advised that the above-named patient will require a short-term nursing home stay - anticipated 30 days or less for rehabilitation and strengthening.  The plan is for return home.                 MD signature                Date

## 2022-05-06 NOTE — NC FL2 (Signed)
[name]     IDENTIFICATION  Patient Name: Russell Hebert Birthdate: 04-28-1946 Sex: male Admission Date (Current Location): 04/30/2022  [facility] and [place] Number:  Herbalist and Address:  [facility]. [facility], [place] [address] [place], [place] [address]      Provider Number: [id number]  Attending Physician Name and Address:  Elodia Florence., *  Relative Name and Phone Number:  Elgie Collard Daughter [phone]    Current Level of Care: Hospital Recommended Level of Care: [place] Prior Approval Number:    Date Approved/Denied:   PASRR Number:    Discharge Plan: SNF    Current Diagnoses: Patient Active Problem List   Diagnosis Date Noted   SIRS (systemic inflammatory response syndrome) ([place]) 05/06/2022   Hypernatremia 05/05/2022   Dysphagia 05/05/2022   Alcohol abuse 05/05/2022   Protein-calorie malnutrition, severe ([place]) 05/05/2022   Acute respiratory failure with hypoxia ([place]) 05/04/2022   Hypoglycemia 83/41/9622   Acute metabolic encephalopathy 29/79/8921   Hypotension 05/03/2022   AKI (acute kidney injury) ([place]) 05/03/2022   Sinus tachycardia 05/02/2022   Hyperkalemia 19/41/7408   Metabolic acidosis 14/48/1856   Elevated LFTs 05/02/2022   Hiatal hernia 05/02/2022   History of recent steroid use 05/02/2022   Anxiety 05/02/2022   Pancreatitis 04/06/2022   Hypertensive urgency 04/12/2022   Hypokalemia 04/21/2022   Leukocytosis 04/14/2022   Ascending aortic aneurysm ([place]) 04/26/2022   Parkinsonism ([place]) 01/16/2022   Syncope 12/23/2019   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   Ejection fraction    Coronary artery disease    Hypertension    Hypercholesterolemia    Intracerebral bleed ([facility])    TIA (transient ischemic attack)    GERD (gastroesophageal reflux disease)    Hx of CABG    Carotid artery disease ([place])     Orientation RESPIRATION BLADDER  Height & Weight     Self  O2 Incontinent, External catheter Weight: 157 lb 6.5 oz (71.4 kg) Height:  '5\' 7"'$  (170.2 cm)  BEHAVIORAL SYMPTOMS/MOOD NEUROLOGICAL BOWEL NUTRITION STATUS      Incontinent Feeding tube  AMBULATORY STATUS COMMUNICATION OF NEEDS Skin   Total Care Verbally Normal                       Personal Care Assistance Level of Assistance  Bathing, Feeding, Dressing Bathing Assistance: Limited assistance Feeding assistance: Limited assistance Dressing Assistance: Limited assistance     Functional Limitations Info  Sight, Hearing, Speech Sight Info: Impaired Hearing Info: Impaired Speech Info: Impaired    SPECIAL CARE FACTORS FREQUENCY  PT (By licensed PT), OT (By licensed OT)     PT Frequency: 5x week OT Frequency: 5x week            Contractures Contractures Info: Not present    Additional Factors Info  Code Status, Allergies Code Status Info: DNR Allergies Info: Morphine, Celebrex (Celecoxib)           Current Medications (05/06/2022):  This is the current hospital active medication list Current Facility-Administered Medications  Medication Dose Route Frequency Provider Last Rate Last Admin   acetaminophen (TYLENOL) tablet 650 mg  650 mg Oral Q6H PRN Darliss Cheney, MD   650 mg at 05/06/22 0518   Or   acetaminophen (TYLENOL) suppository 650 mg  650 mg Rectal Q6H PRN Darliss Cheney, MD       carbidopa-levodopa (SINEMET IR) 25-100 MG per tablet immediate release  1 tablet  1 tablet Oral TID Elodia Florence., MD   1 tablet at 05/06/22 1015   clopidogrel (PLAVIX) tablet 75 mg  75 mg Oral Daily Elodia Florence., MD   75 mg at 05/06/22 0914   dextrose 5 % with KCl 20 mEq / L  infusion  20 mEq Intravenous Continuous Elodia Florence., MD 100 mL/hr at 05/06/22 1018 20 mEq at 05/06/22 1018   diazepam (VALIUM) tablet 5 mg  5 mg Oral Daily PRN Elodia Florence., MD   5 mg at 05/05/22 1342   enoxaparin (LOVENOX) injection 40 mg  40 mg  Subcutaneous Q24H Dang, Thuy D, RPH   40 mg at 05/05/22 1646   feeding supplement (VITAL 1.5 CAL) liquid 1,000 mL  1,000 mL Per Tube Continuous Elodia Florence., MD 60 mL/hr at 05/05/22 1138 1,000 mL at 95/18/84 1660   folic acid (FOLVITE) tablet 1 mg  1 mg Oral Daily Elodia Florence., MD   1 mg at 05/06/22 6301   hydrALAZINE (APRESOLINE) injection 10 mg  10 mg Intravenous Q4H PRN Darliss Cheney, MD       HYDROmorphone (DILAUDID) injection 0.5 mg  0.5 mg Intravenous Q2H PRN Elodia Florence., MD   0.5 mg at 05/06/22 0519   levalbuterol (XOPENEX) nebulizer solution 0.63 mg  0.63 mg Nebulization Q4H PRN Rise Patience, MD   0.63 mg at 05/06/22 1230   MEDLINE mouth rinse  15 mL Mouth Rinse BID Elodia Florence., MD   15 mL at 05/06/22 0925   metoCLOPramide (REGLAN) injection 5 mg  5 mg Intravenous Once PRN Elodia Florence., MD       metoprolol tartrate (LOPRESSOR) tablet 25 mg  25 mg Oral BID Elodia Florence., MD       multivitamin with minerals tablet 1 tablet  1 tablet Per Tube Daily Elodia Florence., MD   1 tablet at 05/06/22 0914   ondansetron (ZOFRAN) tablet 4 mg  4 mg Oral Q6H PRN Darliss Cheney, MD       Or   ondansetron (ZOFRAN) injection 4 mg  4 mg Intravenous Q6H PRN Pahwani, Einar Grad, MD       pantoprazole (PROTONIX) injection 40 mg  40 mg Intravenous Q24H Pahwani, Einar Grad, MD   40 mg at 05/05/22 1643   phosphorus (K PHOS NEUTRAL) tablet 500 mg  500 mg Oral QID Elodia Florence., MD   500 mg at 05/06/22 0914   piperacillin-tazobactam (ZOSYN) IVPB 3.375 g  3.375 g Intravenous Q8H Reome, Earle J, RPH 12.5 mL/hr at 05/06/22 1355 3.375 g at 05/06/22 1355   polyethylene glycol (MIRALAX / GLYCOLAX) packet 17 g  17 g Oral Daily PRN Elodia Florence., MD       thiamine '500mg'$  in normal saline (33m) IVPB  500 mg Intravenous Q8H PElodia Florence, MD 100 mL/hr at 05/06/22 0554 500 mg at 05/06/22 0554   Followed by   [Derrill MemoON 05/08/2022] thiamine  (B-1) 250 mg in sodium chloride 0.9 % 50 mL IVPB  250 mg Intravenous Daily PElodia Florence, MD       Followed by   [Derrill MemoON 05/13/2022] thiamine (B-1) injection 100 mg  100 mg Intravenous Daily PElodia Florence, MD         Discharge Medications: Please see discharge summary for a list of discharge medications.  Relevant Imaging Results:  Relevant Lab Results:   Additional Information SSN: [id number], pt is vaccinated with at least one booster  Joanne Chars, LCSW

## 2022-05-06 NOTE — Progress Notes (Signed)
OT Cancellation Note  Patient Details Name: MAGNUS CRESCENZO MRN: [id number] DOB: Aug 10, 1946   Cancelled Treatment:    Reason Eval/Treat Not Completed: Other (comment) (RN reporting therapy hold off today, pt being transferred to PCU. Will follow.)  Lynnda Child, OTD, OTR/L Acute Rehab [phone] - [place] 05/06/2022, 10:44 AM

## 2022-05-06 NOTE — Significant Event (Signed)
Rapid Response Event Note   Reason for Call :  Red MEWS, tachycardia, tachypnea (Not new or changed from prior assessments)  Initial Focused Assessment:  Pt lying in bed, able to answer questions appropriately. Denies pain or SOB. Pt slightly restless with tremors noted.  Temp 100.1, HR 125, RR 28, spO2 98% on 3L [place], BP 129/90  Lung sounds clear/Diminished. No wheezing or rhonchi noted.    Interventions:  ordered prior to my arrival: CXR, labs, blood cultures  Pt may benefits from CIWA protocol and PRNs.  Primary RN made aware. Also messaged Md to suggest possibility of Etoh withdrawals.   Plan of Care:  Continue to monitor pt closely. Call RRT with any changes or assistance needed.   Will follow up with Md orders.   Event Summary:   MD Notified: Fayrene Helper Call Time: 0825 Arrival Time: [phone] End Time: 4473  Sherilyn Dacosta, RN

## 2022-05-06 NOTE — Plan of Care (Signed)
  Problem: Clinical Measurements: Goal: Diagnostic test results will improve Outcome: Not Progressing   Problem: Clinical Measurements: Goal: Respiratory complications will improve Outcome: Not Progressing   Problem: Clinical Measurements: Goal: Cardiovascular complication will be avoided Outcome: Not Progressing   Problem: Nutrition: Goal: Adequate nutrition will be maintained Outcome: Not Progressing   Problem: Safety: Goal: Ability to remain free from injury will improve Outcome: Not Progressing   Problem: Skin Integrity: Goal: Risk for impaired skin integrity will decrease Outcome: Not Progressing

## 2022-05-06 NOTE — TOC Initial Note (Signed)
Transition of Care [facility]) - Initial/Assessment Note    Patient Details  Name: Russell Hebert MRN: [id number] Date of Birth: 1946-05-22  [facility] CM/SW Contact:    Joanne Chars, LCSW Phone Number: 05/06/2022, 2:13 PM  Clinical Narrative:      Pt oriented x1, CSW spoke with pt daughter Arbie Cookey. She is agreeable to SNF recommendation, said pt lives alone, does not have 24 hours help to be able to pursue CIR.  Permission given to send out referral in hub. Choice document left in pt room, daughter will pick it up there.  Pt is vaccinated for covid with at least one booster.   PASSR went to level 2.             Expected Discharge Plan: Skilled Nursing Facility Barriers to Discharge: Continued Medical Work up, SNF Pending bed offer   Patient Goals and CMS Choice   CMS Medicare.gov Compare Post Acute Care list provided to:: Patient Represenative (must comment) Choice offered to / list presented to : Adult Children (daughter Arbie Cookey)  Expected Discharge Plan and Services Expected Discharge Plan: [place] In-house Referral: Clinical Social Work   Post Acute Care Choice: [place] Living arrangements for the past 2 months: [place]                                      Prior Living Arrangements/Services Living arrangements for the past 2 months: Single Family Home Lives with:: Self Patient language and need for interpreter reviewed:: Yes        Need for Family Participation in Patient Care: Yes (Comment) Care giver support system in place?: Yes (comment) Current home services: Other (comment) (none) Criminal Activity/Legal Involvement Pertinent to Current Situation/Hospitalization: No - Comment as needed  Activities of Daily Living      Permission Sought/Granted                  Emotional Assessment Appearance:: Appears stated age Attitude/Demeanor/Rapport: Unable to Assess Affect (typically observed):  Unable to Assess Orientation: : Oriented to Self Alcohol / Substance Use: Alcohol Use Psych Involvement: No (comment)  Admission diagnosis:  Acute pancreatitis [K85.90] Acute pancreatitis, unspecified complication status, unspecified pancreatitis type [K85.90] Patient Active Problem List   Diagnosis Date Noted   SIRS (systemic inflammatory response syndrome) ([place]) 05/06/2022   Hypernatremia 05/05/2022   Dysphagia 05/05/2022   Alcohol abuse 05/05/2022   Protein-calorie malnutrition, severe ([place]) 05/05/2022   Acute respiratory failure with hypoxia ([place]) 05/04/2022   Hypoglycemia 86/76/7209   Acute metabolic encephalopathy 47/08/6282   Hypotension 05/03/2022   AKI (acute kidney injury) ([place]) 05/03/2022   Sinus tachycardia 05/02/2022   Hyperkalemia 66/29/4765   Metabolic acidosis 46/50/3546   Elevated LFTs 05/02/2022   Hiatal hernia 05/02/2022   History of recent steroid use 05/02/2022   Anxiety 05/02/2022   Pancreatitis 04/13/2022   Hypertensive urgency 04/29/2022   Hypokalemia 04/14/2022   Leukocytosis 04/14/2022   Ascending aortic aneurysm ([place]) 04/08/2022   Parkinsonism ([place]) 01/16/2022   Syncope 12/23/2019   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   Ejection fraction    Coronary artery disease    Hypertension    Hypercholesterolemia    Intracerebral bleed (HCC)    TIA (transient ischemic attack)    GERD (gastroesophageal reflux disease)    Hx of CABG    Carotid artery disease ([place])    PCP:  Mitchell, L.Marlou Sa, MD Pharmacy:   [facility] [address] [place]Phone: [phone]: [phone] [address] [address] [place]Phone: [phone]: [phone] (Inactive see NCPDP [id number] [place] [address] [place]Phone: [phone]: [phone]    Social Determinants of Health (SDOH) Interventions    Readmission Risk Interventions     View : No data to display.

## 2022-05-06 NOTE — Progress Notes (Addendum)
Red MEWS noted for patient, tylenol administered per PRN order, Dr. Duard Larsen updated, Jamelle Haring, RN Charge RN updated, will monitor patient per policy.   At [place], call from Dr. Hal Hope received, no new order made, will recheck vitals per policy.  At 0546, lactic acid stat ordered.      05/06/22 0514  Assess: MEWS Score  Temp (!) 101.9 F (38.8 C)  BP 133/82  MAP (mmHg) 99  Pulse Rate (!) 127  Resp (!) 22  SpO2 92 %  O2 Device Room Air  O2 Flow Rate (L/min) 3 L/min  Assess: MEWS Score  MEWS Temp 2  MEWS Systolic 0  MEWS Pulse 2  MEWS RR 1  MEWS LOC 0  MEWS Score 5  MEWS Score Color Red  Assess: if the MEWS score is Yellow or Red  Were vital signs taken at a resting state? Yes  Focused Assessment Change from prior assessment (see assessment flowsheet)  Treat  MEWS Interventions Administered prn meds/treatments;Escalated (See documentation below)  Take Vital Signs  Increase Vital Sign Frequency  Red: Q 1hr X 4 then Q 4hr X 4, if remains red, continue Q 4hrs  Escalate  MEWS: Escalate Red: discuss with charge nurse/RN and provider, consider discussing with RRT  Notify: Charge Nurse/RN  Name of Charge Nurse/RN Notified Jamelle Haring RN  Date Charge Nurse/RN Notified 05/06/22  Time Charge Nurse/RN Notified 0520  Notify: Provider  Provider Name/Title Dr. Hal Hope  Date Provider Notified 05/06/22  Time Provider Notified [phone]  Method of Notification Page  Notification Reason  (RED mEWS)  Assess: SIRS CRITERIA  SIRS Temperature  1  SIRS Pulse 1  SIRS Respirations  1  SIRS WBC 0  SIRS Score Sum  3

## 2022-05-06 NOTE — Progress Notes (Signed)
Patient consistently yellow MEWS with focus on heart rate, has lopressor night dose to be given.    05/05/22 2021  Assess: MEWS Score  Temp 98.1 F (36.7 C)  BP 104/66  MAP (mmHg) 78  Pulse Rate (!) 129  Resp 20  Level of Consciousness Alert  SpO2 100 %  O2 Device Nasal Cannula  O2 Flow Rate (L/min) 4 L/min  Assess: MEWS Score  MEWS Temp 0  MEWS Systolic 0  MEWS Pulse 2  MEWS RR 0  MEWS LOC 0  MEWS Score 2  MEWS Score Color Yellow  Assess: if the MEWS score is Yellow or Red  Were vital signs taken at a resting state? Yes  Focused Assessment No change from prior assessment  Does the patient meet 2 or more of the SIRS criteria? No  Does the patient have a confirmed or suspected source of infection? No  MEWS guidelines implemented *See Row Information* No, previously yellow, continue vital signs every 4 hours  Treat  Breathing 0  Negative Vocalization 0  Facial Expression 0  Body Language 0  Consolability 0  PAINAD Score 0  Assess: SIRS CRITERIA  SIRS Temperature  0  SIRS Pulse 1  SIRS Respirations  0  SIRS WBC 0  SIRS Score Sum  1

## 2022-05-06 NOTE — Progress Notes (Signed)
PROGRESS NOTE    Russell Hebert  OMV:[id number] DOB: September 10, 1946 DOA: 04/30/2022 PCP: Alroy Dust, L.Marlou Sa, MD  Chief Complaint  Patient presents with   Back Pain    Brief Narrative:  Russell Hebert is Russell Hebert 76 y.o. male with medical history significant of parkinsonian syndrome, GERD, hypertension, CAD s/p CABG, history of fall in December resulting in lumbar spine fracture, presented to ED with Russell Hebert complaint of back pain.  Lived independently and walked with walker prior to admission.  Drank 3-4 beers 2-3x Russell Hebert week, but last beer 4 days piror to admission.  CT with findings concerning for pancreatitis.  Hospital course complicated by delirium and uncontrolled pain.    GI consulted 6/2.   See below for additional details     Assessment & Plan:   Principal Problem:   Pancreatitis Active Problems:   SIRS (systemic inflammatory response syndrome) (HCC)   Acute respiratory failure with hypoxia (HCC)   Sinus tachycardia   Acute metabolic encephalopathy   AKI (acute kidney injury) ([place])   Metabolic acidosis   Hypernatremia   Hyperkalemia   Elevated LFTs   Hypotension   Alcohol abuse   Ascending aortic aneurysm (HCC)   Hypoglycemia   Hiatal hernia   Dysphagia   Parkinsonism ([place])   History of recent steroid use   Anxiety   Protein-calorie malnutrition, severe (HCC)   Hypertension   GERD (gastroesophageal reflux disease)   Hx of CABG   Carotid artery disease (HCC)   Hypertensive urgency   Hypokalemia   Leukocytosis   Assessment and Plan: * Pancreatitis CT at presentation with moderate non complicated pancreatitis Repeat CT with progressive acute pancreatitis Given RUQ [place] with cholelithiasis, suspect this is most likely gallstone pancreatitis.  Etoh pancreatitis also on ddx (though he reported to admitting provider not drinking for several days prior to admission - will clarify when he's better able).  Normal triglycerides.  No obvious meds that stand out as cause. Holding IVF  at this point with concerns for volume overload, diuresing as tolerated Abdominal exam Russell Hebert bit better today, still distended, but less pain on exam Repeat CT scan today given fevers above cortrak placed 6/2 - tube feeds per RD, seems to be tolerating this well.  Dysphagia 1 diet, nectar thick liquids as tolerated.  Pain management, bowel regimen Appreciate GI recommendations   SIRS (systemic inflammatory response syndrome) (HCC) Fever, tachycardia, tachypnea noted overnight Blood cultures, repeat (prior from 6/2 NGTD) UA CXR pending covid testing Will repeat CT abd/pelvis given new fever Lactic 1.9, repeat 2.1 -> unclear significance, will follow Negative urine strep, negative MRSA PCR abx broadened to zosyn   Acute respiratory failure with hypoxia ([place]) Repeat CXR 6/4 pending Broaden abx to zosyn for fever Follow blood cultures NG (repeat pending), sputum cx if able, urine strep negative, MRSA PCR negative suspect component related to volume overload, follow BNP (elevated) and echo (poor quality study, normal EF, indeterminate diastolic parameters) Continue lasix daily as tolerated Continue I/O, daily weights  AKI (acute kidney injury) ([place]) CT without hydro Given imaging, likely contrast induced nephropathy, hemodynamically mediated with pancreatitis IVF on hold Lasix daily as tolerated Strict I/O, daily weights   Acute metabolic encephalopathy Suspect this is related to opiates, acute hospitalization, pain, acute kidney injury etc He's moving all extremities without any appreciated focal deficits - I think everything (slowed/delayed responses/speech/hallucinations) at this time can be attributed to opiates and hospital delirium  ? Of whether this could be related to some etoh withdrawal as  well, but per admission hx, doesn't quite line up (unclear how accurate) - hold off on ciwa protocol given seems to be improving on hospital day 5 now and seems to be improving from mental  status Seems slightly more clear again today, knew location (said May, but it's early June) Follow with opiates  Delirium precautions High dose thiamine Workup additionally if persistent or worsening  Sinus tachycardia EKG with sinus tach, possible q in II, aVF appear chronic Seems likely related to pain from pancreatitis, respiratory status Now being diuresed as noted above Echo poor quality, normal EF Frequent PVC's, will start low dose metop - sinus tach, suspect cause pancreatitis/resp status, but given persistence will give Kanani Mowbray little beta blocker but will continue to treat underlying cause    Hypernatremia Increase rate for D5, follow   Metabolic acidosis resolved  Alcohol abuse Per admission H&P drinks 3-4 beers 2-3x/week, last drink 4 days prior to presentation Hx to GI is that he "drinks heavy alcohol on regular basis"  Thiamine, folate, mvi  Hypotension Related to opiates improved  Elevated LFTs Elevated alk phos and ast, mild Improved, follow  Hyperkalemia S/p lokelma Follow, now hypo  Hypoglycemia Due to poor PO intake D5 fluids, follow  Cortrak, tube feeds started A1c 5.5  Ascending aortic aneurysm (HCC) 4.2 cm, needs annual follow up imaging  Dysphagia He's pocketing pills, related to encephalopathy SLP eval, dysphagia 1/nectar thickened liquids  Hiatal hernia Esophageal air fluid level suggests dysmotility or GERD PPI daily  Parkinsonism ([place]) sinemet  Protein-calorie malnutrition, severe ([place]) RD, tube feeds  History of recent steroid use Per med rec, he's no longer taking this, will d/c       DVT prophylaxis: lovenox Code Status: DNR Family Communication: daughter in law 6/3 Disposition:   Status is: Inpatient Remains inpatient appropriate because: additional w/u needed   Consultants:  none  Procedures:  none  Antimicrobials:  Anti-infectives (From admission, onward)    Start     Dose/Rate Route Frequency Ordered  Stop   05/06/22 0915  piperacillin-tazobactam (ZOSYN) IVPB 3.375 g        3.375 g 12.5 mL/hr over 240 Minutes Intravenous Every 8 hours 05/06/22 0818     05/04/22 0830  Ampicillin-Sulbactam (UNASYN) 3 g in sodium chloride 0.9 % 100 mL IVPB  Status:  Discontinued        3 g 200 mL/hr over 30 Minutes Intravenous Every 6 hours 05/04/22 0740 05/06/22 0807       Subjective: Denies pain or SOB Says he's at [facility], says month is may  Objective: Vitals:   05/06/22 0514 05/06/22 0613 05/06/22 0705 05/06/22 0811  BP: 133/82 113/62 120/72 129/90  Pulse: (!) 127 (!) 125 (!) 124 (!) 124  Resp: (!) 22 (!) 22 (!) 22 (!) 28  Temp: (!) 101.9 F (38.8 C) (!) 101.9 F (38.8 C) 100.1 F (37.8 C) 100.1 F (37.8 C)  TempSrc: Oral Oral Oral Axillary  SpO2: 92% 96% 96% 99%  Weight:      Height:        Intake/Output Summary (Last 24 hours) at 05/06/2022 0941 Last data filed at 05/05/2022 2200 Gross per 24 hour  Intake 1915.6 ml  Output 1350 ml  Net 565.6 ml   Filed Weights   05/03/22 0500 05/04/22 0600 05/06/22 0500  Weight: 71.8 kg 77.6 kg 71.4 kg    Examination:  General: No acute distress. Cardiovascular: tachycardic, regular Lungs: diminished, tachypneic Abdomen: distended, nontender Neurological: Alert and oriented 1-2 (said  month was may, knew cone) today, but able to engage more clearly today. Moves all extremities 4. Cranial nerves II through XII grossly intact. Extremities: no LEE   Data Reviewed: I have personally reviewed following labs and imaging studies  CBC: Recent Labs  Lab 04/15/2022 1352 04/15/2022 1416 05/02/22 0120 05/03/22 0603 05/04/22 0412 05/05/22 0123 05/06/22 0109  WBC 15.5*  --  16.4* 9.5 5.6 5.2 6.0  NEUTROABS 14.0*  --   --  8.5* 5.1 4.2 4.2  HGB 13.4   < > 14.9 12.9* 11.2* 11.8* 11.3*  HCT 43.1   < > 46.3 40.8 35.2* 36.4* 34.2*  MCV 92.3  --  90.1 91.9 90.3 88.3 86.6  PLT 447*  --  488* 340 249 232 206   < > = values in this interval not displayed.     Basic Metabolic Panel: Recent Labs  Lab 04/29/2022 1849 05/02/22 0120 05/03/22 0603 05/03/22 1559 05/04/22 0412 05/04/22 1630 05/05/22 0123 05/05/22 1554 05/06/22 0109  NA  --    < > 141   < > 144 144 146* 144 149*  K  --    < > 5.4*   < > 4.2 3.6 3.5 3.2* 3.2*  CL  --    < > 118*   < > 118* 115* 116* 112* 115*  CO2  --    < > 18*   < > 19* 21* 23 22 26   GLUCOSE  --    < > 92   < > 64* 81 103* 165* 142*  BUN  --    < > 39*   < > 38* 34* 32* 28* 27*  CREATININE  --    < > 2.51*   < > 1.46* 1.29* 1.20 1.10 1.15  CALCIUM  --    < > 8.0*   < > 7.9* 7.9* 8.0* 7.6* 7.6*  MG 1.9  --  1.6*  --  2.2  --  2.4  --  2.1  PHOS  --   --  3.7  --  2.8  --  2.1*  --  1.7*   < > = values in this interval not displayed.    GFR: Estimated Creatinine Clearance: 50.3 mL/min (by C-G formula based on SCr of 1.15 mg/dL).  Liver Function Tests: Recent Labs  Lab 05/03/22 0603 05/04/22 0412 05/04/22 1630 05/05/22 0123 05/06/22 0109  AST 34 36 42* 41 34  ALT 7 6 9 10 19   ALKPHOS 72 48 49 50 43  BILITOT 1.1 1.4* 1.1 0.8 0.8  PROT 5.3* 5.2* 5.2* 5.5* 5.2*  ALBUMIN 2.4* 2.1* 2.2* 2.3* 1.8*    CBG: Recent Labs  Lab 05/05/22 1635 05/05/22 2010 05/06/22 0057 05/06/22 0513 05/06/22 0807  GLUCAP 173* 146* 145* 160* 178*     Recent Results (from the past 240 hour(s))  Urine Culture     Status: None   Collection Time: 05/02/22 12:41 PM   Specimen: Urine, Clean Catch  Result Value Ref Range Status   Specimen Description URINE, CLEAN CATCH  Final   Special Requests NONE  Final   Culture   Final    NO GROWTH Performed at [facility], [place] [address] [place], [place] [address]    Report Status 05/03/2022 FINAL  Final  MRSA Next Gen by PCR, Nasal     Status: None   Collection Time: 05/04/22  7:28 AM   Specimen: Nasal Mucosa; Nasal Swab  Result Value Ref Range Status  MRSA by PCR Next Gen NOT DETECTED NOT DETECTED Final    Comment: (NOTE) The GeneXpert MRSA Assay (FDA  approved for NASAL specimens only), is one component of Sharnise Blough comprehensive MRSA colonization surveillance program. It is not intended to diagnose MRSA infection nor to guide or monitor treatment for MRSA infections. Test performance is not FDA approved in patients less than 58 years old. Performed at [facility], [place] [address], [place], [place] [address]   Culture, blood (Routine X 2) w Reflex to ID Panel     Status: None (Preliminary result)   Collection Time: 05/04/22  8:39 AM   Specimen: BLOOD RIGHT FOREARM  Result Value Ref Range Status   Specimen Description BLOOD RIGHT FOREARM  Final   Special Requests   Final    BOTTLES DRAWN AEROBIC AND ANAEROBIC Blood Culture adequate volume   Culture   Final    NO GROWTH 2 DAYS Performed at [facility], [place] [address] [place], [place] [address]    Report Status PENDING  Incomplete  Culture, blood (Routine X 2) w Reflex to ID Panel     Status: None (Preliminary result)   Collection Time: 05/04/22  8:49 AM   Specimen: BLOOD RIGHT WRIST  Result Value Ref Range Status   Specimen Description BLOOD RIGHT WRIST  Final   Special Requests   Final    BOTTLES DRAWN AEROBIC AND ANAEROBIC Blood Culture adequate volume   Culture   Final    NO GROWTH 2 DAYS Performed at [facility], [address], [place], [place] [address]    Report Status PENDING  Incomplete         Radiology Studies: DG Abd 1 View  Result Date: 05/04/2022 CLINICAL DATA:  Epigastric abdominal pain.  Nausea. EXAM: ABDOMEN - 1 VIEW COMPARISON:  CT, 05/02/2022. FINDINGS: No bowel dilation to suggest obstruction. Aortic, iliac and femoral artery vascular calcifications. No evidence of renal or ureteral stones. No acute skeletal abnormality. IMPRESSION: 1. No evidence of bowel obstruction. 2. Findings of pancreatitis noted on the recent prior CT are not visualized radiographically. Electronically Signed   By: Lajean Manes M.D.   On: 05/04/2022 13:00   DG CHEST  PORT 1 VIEW  Result Date: 05/04/2022 CLINICAL DATA:  Shortness of breath EXAM: PORTABLE CHEST 1 VIEW COMPARISON:  05/04/2022, CT 04/30/2022, FINDINGS: Post sternotomy changes. Esophageal tube is looped at the lower chest within Clorene Nerio hiatal hernia, tip below the diaphragm and probably within the distal stomach. Cardiomegaly with vascular congestion and hazy lung fields likely due to mild edema. There is Fredia Chittenden small left-sided effusion. IMPRESSION: 1. Cardiomegaly with mild vascular congestion and hazy pulmonary opacity possible edema. Small left-sided effusion. 2. Esophageal tube is looped at the lower chest within Kimmy Parish hiatal hernia with the tip position below the diaphragm and probably within the distal stomach. Electronically Signed   By: Donavan Foil M.D.   On: 05/04/2022 21:23   DG Abd Portable 1V  Result Date: 05/04/2022 CLINICAL DATA:  [id number].  Feeding tube placement EXAM: PORTABLE ABDOMEN - 1 VIEW COMPARISON:  X-ray abdomen 62/2/23 2:25 p.m., CT abdomen pelvis 05/02/2022, CT abdomen pelvis 04/11/2022 FINDINGS: Hiatal hernia. Interval advancement of an enteric tube with tip overlying the expected region of the gastric lumen. The bowel gas pattern is normal. No radio-opaque calculi or other significant radiographic abnormality are seen. Aortic calcification. IMPRESSION: 1. Enteric tube with tip overlying the expected region of the gastric lumen. 2. Hiatal hernia. 3.  Aortic Atherosclerosis (ICD10-I70.0). Electronically Signed   By: Iven Finn M.D.   On: 05/04/2022 15:51   DG Abd Portable 1V  Result Date: 05/04/2022 CLINICAL DATA:  Feeding tube placement. EXAM: PORTABLE ABDOMEN - 1 VIEW COMPARISON:  05/04/2022 at 12:32 p.m. FINDINGS: Enteric tube tip projects in the medial left upper quadrant consistent with positioning at or just below the expected location of the gastroesophageal junction. Normal bowel gas pattern. IMPRESSION: 1. Enteric feeding tube tip lies at the expected location of the gastroesophageal  junction. This will need to be further inserted, at least 20 cm, to allow adequate length for the tube to into the duodenum. Electronically Signed   By: Lajean Manes M.D.   On: 05/04/2022 14:41   ECHOCARDIOGRAM COMPLETE  Result Date: 05/04/2022    ECHOCARDIOGRAM REPORT   Patient Name:   Russell Hebert Date of Exam: 05/04/2022 Medical Rec #:  [id number]         Height:       67.0 in Accession #:    [id number]        Weight:       171.1 lb Date of Birth:  [birth year]          BSA:          1.892 m Patient Age:    48 years          BP:           160/94 mmHg Patient Gender: M                 HR:           104 bpm. Exam Location:  Inpatient Procedure: 2D Echo, Cardiac Doppler and Color Doppler Indications:    Other abnormalities of the heart  History:        Patient has prior history of Echocardiogram examinations. CAD,                 Prior CABG, TIA; Risk Factors:Hypertension.  Sonographer:    Jyl Heinz Referring Phys: [phone] Sanvi Ehler CALDWELL POWELL JR  Sonographer Comments: Image acquisition challenging due to uncooperative patient. No Pedoff- pt would not allow me to take any more images. IMPRESSIONS  1. Left ventricular ejection fraction, by estimation, is 55 to 60%. The left ventricle has normal function. The left ventricle has no regional wall motion abnormalities. Left ventricular diastolic parameters are indeterminate.  2. Right ventricular systolic function is normal. The right ventricular size is normal.  3. The mitral valve is normal in structure. No evidence of mitral valve regurgitation.  4. There is moderate calcification of the aortic valve. Aortic valve regurgitation is not visualized. Mild aortic valve stenosis.  5. 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  6. Unable to visualize. Comparison(s): No significant change from prior study. FINDINGS  Left Ventricle: Left ventricular ejection fraction, by estimation, is 55 to 60%. The left ventricle has normal function. The left ventricle has no regional  wall motion abnormalities. The left ventricular internal cavity size was normal in size. There is  no left ventricular hypertrophy. Left ventricular diastolic parameters are indeterminate. Right Ventricle: The right ventricular size is normal. Right ventricular systolic function is normal. Left Atrium: Left atrial size was normal in size. Right Atrium: Right atrial size was normal in size. Pericardium: There is no evidence of pericardial effusion. Mitral Valve: The mitral valve is normal in structure. There is mild calcification of the mitral valve leaflet(s). No evidence of mitral valve regurgitation. Tricuspid  Valve: The tricuspid valve is normal in structure. Tricuspid valve regurgitation is not demonstrated. Aortic Valve: There is moderate calcification of the aortic valve. Aortic valve regurgitation is not visualized. Mild aortic stenosis is present. Aortic valve mean gradient measures 13.5 mmHg. Aortic valve peak gradient measures 22.0 mmHg. Aortic valve area, by VTI measures 1.44 cm. Pulmonic Valve: The pulmonic valve was normal in structure. Pulmonic valve regurgitation is not visualized. Aorta: Small ascending aortic aneurysm 4.2 cm. Venous: Unable to visualize. IAS/Shunts: The interatrial septum was not well visualized.  LEFT VENTRICLE PLAX 2D LVIDd:         4.20 cm      Diastology LVIDs:         3.00 cm      LV e' medial:    5.13 cm/s LV PW:         0.80 cm      LV E/e' medial:  24.6 LV IVS:        0.90 cm      LV e' lateral:   11.80 cm/s LVOT diam:     2.00 cm      LV E/e' lateral: 10.7 LV SV:         54 LV SV Index:   29 LVOT Area:     3.14 cm  LV Volumes (MOD) LV vol d, MOD A2C: 89.2 ml LV vol d, MOD A4C: 106.0 ml LV vol s, MOD A2C: 35.6 ml LV vol s, MOD A4C: 38.0 ml LV SV MOD A2C:     53.6 ml LV SV MOD A4C:     106.0 ml LV SV MOD BP:      60.6 ml RIGHT VENTRICLE RV Basal diam:  3.30 cm RV S prime:     20.40 cm/s TAPSE (M-mode): 1.7 cm LEFT ATRIUM             Index        RIGHT ATRIUM           Index  LA diam:        3.20 cm 1.69 cm/m   RA Area:     15.90 cm LA Vol (A2C):   34.2 ml 18.07 ml/m  RA Volume:   46.40 ml  24.52 ml/m LA Vol (A4C):   53.3 ml 28.17 ml/m LA Biplane Vol: 42.8 ml 22.62 ml/m  AORTIC VALVE AV Area (Vmax):    1.54 cm AV Area (Vmean):   1.48 cm AV Area (VTI):     1.44 cm AV Vmax:           234.50 cm/s AV Vmean:          180.000 cm/s AV VTI:            0.374 m AV Peak Grad:      22.0 mmHg AV Mean Grad:      13.5 mmHg LVOT Vmax:         115.00 cm/s LVOT Vmean:        84.900 cm/s LVOT VTI:          0.172 m LVOT/AV VTI ratio: 0.46  AORTA Ao Root diam: 3.30 cm Ao Asc diam:  4.20 cm MITRAL VALVE MV Area (PHT): 5.70 cm     SHUNTS MV Decel Time: 133 msec     Systemic VTI:  0.17 m MV E velocity: 126.00 cm/s  Systemic Diam: 2.00 cm Phineas Inches Electronically signed by Phineas Inches Signature Date/Time: 05/04/2022/10:58:25 AM    Final  Scheduled Meds:  carbidopa-levodopa  1 tablet Oral TID   clopidogrel  75 mg Oral Daily   enoxaparin (LOVENOX) injection  40 mg Subcutaneous Y05R   folic acid  1 mg Oral Daily   mouth rinse  15 mL Mouth Rinse BID   metoprolol tartrate  12.5 mg Oral Once   metoprolol tartrate  25 mg Oral BID   multivitamin with minerals  1 tablet Per Tube Daily   pantoprazole (PROTONIX) IV  40 mg Intravenous Q24H   phosphorus  500 mg Oral QID   [START ON 05/13/2022] thiamine injection  100 mg Intravenous Daily   Continuous Infusions:  dextrose 5 % with KCl 20 mEq / L     feeding supplement (VITAL 1.5 CAL) 1,000 mL (05/05/22 1138)   piperacillin-tazobactam (ZOSYN)  IV 3.375 g (05/06/22 0925)   potassium PHOSPHATE IVPB (in mmol) 30 mmol (05/06/22 0455)   thiamine injection 500 mg (05/06/22 0554)   Followed by   Derrill Memo ON 05/08/2022] thiamine injection       LOS: 5 days    Time spent: over 30 min    Fayrene Helper, MD [facility]   To contact the attending provider between 7A-7P or the covering provider during after hours 7P-7A, please log  into the web site [email or web address] and access using universal [place] password for that web site. If you do not have the password, please call the hospital operator.  05/06/2022, 9:41 AM

## 2022-05-06 NOTE — Progress Notes (Signed)
Red MEWS again, MD was notified. Rapid team was called in, possible withdrawal from ETOH, MD was informed of the Rapid team evaluation. MD ordered labs, xray CT scan and additional meds. Continue to monitor pt.   05/06/22 0811  Assess: MEWS Score  Temp 100.1 F (37.8 C)  BP 129/90  MAP (mmHg) 102  Pulse Rate (!) 124  Resp (!) 28 (2nd check- RN notified)  SpO2 99 %  O2 Device Nasal Cannula  O2 Flow Rate (L/min) 3 L/min  Assess: MEWS Score  MEWS Temp 0  MEWS Systolic 0  MEWS Pulse 2  MEWS RR 2  MEWS LOC 0  MEWS Score 4  MEWS Score Color Red  Assess: if the MEWS score is Yellow or Red  Were vital signs taken at a resting state? Yes  Focused Assessment No change from prior assessment  Does the patient meet 2 or more of the SIRS criteria? No  Does the patient have a confirmed or suspected source of infection? No  MEWS guidelines implemented *See Row Information* No, previously red, continue vital signs every 4 hours  Treat  Pain Scale 0-10  Pain Score 0  Assess: SIRS CRITERIA  SIRS Temperature  0  SIRS Pulse 1  SIRS Respirations  1  SIRS WBC 0  SIRS Score Sum  2

## 2022-05-06 NOTE — Progress Notes (Signed)
Subjective: Patient was transferred to 2 central today. He is tachycardic, tachypneic and febrile. He just received down from a CAT scan of abdomen and pelvis.  Objective: Vital signs in last 24 hours: Temp:  [98.1 F (36.7 C)-101.9 F (38.8 C)] 100.1 F (37.8 C) (06/04 0811) Pulse Rate:  [124-129] 124 (06/04 0811) Resp:  [20-28] 28 (06/04 0811) BP: (104-142)/(62-90) 129/90 (06/04 0811) SpO2:  [92 %-100 %] 99 % (06/04 0811) Weight:  [71.4 kg] 71.4 kg (06/04 0500) Weight change:  Last BM Date : 05/06/22  PE: Tachypneic, on 4 L oxygen by nasal cannula, able to speak in few words not full sentences GENERAL: Tachycardic, NG tube feeding  ABDOMEN: Distended, nontender, sluggish bowel sounds EXTREMITIES: No deformity  Lab Results: Results for orders placed or performed during the hospital encounter of 04/18/2022 (from the past 48 hour(s))  Comprehensive metabolic panel     Status: Abnormal   Collection Time: 05/04/22  4:30 PM  Result Value Ref Range   Sodium 144 135 - 145 mmol/L   Potassium 3.6 3.5 - 5.1 mmol/L   Chloride 115 (H) 98 - 111 mmol/L   CO2 21 (L) 22 - 32 mmol/L   Glucose, Bld 81 70 - 99 mg/dL    Comment: Glucose reference range applies only to samples taken after fasting for at least 8 hours.   BUN 34 (H) 8 - 23 mg/dL   Creatinine, Ser 1.29 (H) 0.61 - 1.24 mg/dL   Calcium 7.9 (L) 8.9 - 10.3 mg/dL   Total Protein 5.2 (L) 6.5 - 8.1 g/dL   Albumin 2.2 (L) 3.5 - 5.0 g/dL   AST 42 (H) 15 - 41 U/L   ALT 9 0 - 44 U/L   Alkaline Phosphatase 49 38 - 126 U/L   Total Bilirubin 1.1 0.3 - 1.2 mg/dL   GFR, Estimated 57 (L) >60 mL/min    Comment: (NOTE) Calculated using the CKD-EPI Creatinine Equation (2021)    Anion gap 8 5 - 15    Comment: Performed at [facility], [place] [address] [place], [place] [address]  Glucose, capillary     Status: None   Collection Time: 05/04/22  8:18 PM  Result Value Ref Range   Glucose-Capillary 79 70 - 99 mg/dL    Comment: Glucose  reference range applies only to samples taken after fasting for at least 8 hours.  Glucose, capillary     Status: Abnormal   Collection Time: 05/04/22 11:35 PM  Result Value Ref Range   Glucose-Capillary 108 (H) 70 - 99 mg/dL    Comment: Glucose reference range applies only to samples taken after fasting for at least 8 hours.  Comprehensive metabolic panel     Status: Abnormal   Collection Time: 05/05/22  1:23 AM  Result Value Ref Range   Sodium 146 (H) 135 - 145 mmol/L   Potassium 3.5 3.5 - 5.1 mmol/L   Chloride 116 (H) 98 - 111 mmol/L   CO2 23 22 - 32 mmol/L   Glucose, Bld 103 (H) 70 - 99 mg/dL    Comment: Glucose reference range applies only to samples taken after fasting for at least 8 hours.   BUN 32 (H) 8 - 23 mg/dL   Creatinine, Ser 1.20 0.61 - 1.24 mg/dL   Calcium 8.0 (L) 8.9 - 10.3 mg/dL   Total Protein 5.5 (L) 6.5 - 8.1 g/dL   Albumin 2.3 (L) 3.5 - 5.0 g/dL   AST 41 15 - 41 U/L  ALT 10 0 - 44 U/L   Alkaline Phosphatase 50 38 - 126 U/L   Total Bilirubin 0.8 0.3 - 1.2 mg/dL   GFR, Estimated >60 >60 mL/min    Comment: (NOTE) Calculated using the CKD-EPI Creatinine Equation (2021)    Anion gap 7 5 - 15    Comment: Performed at [facility] [address], [place], [place] [address]  CBC with Differential/Platelet     Status: Abnormal   Collection Time: 05/05/22  1:23 AM  Result Value Ref Range   WBC 5.2 4.0 - 10.5 K/uL   RBC 4.12 (L) 4.22 - 5.81 MIL/uL   Hemoglobin 11.8 (L) 13.0 - 17.0 g/dL   HCT 36.4 (L) 39.0 - 52.0 %   MCV 88.3 80.0 - 100.0 fL   MCH 28.6 26.0 - 34.0 pg   MCHC 32.4 30.0 - 36.0 g/dL   RDW 15.2 11.5 - 15.5 %   Platelets 232 150 - 400 K/uL   nRBC 0.0 0.0 - 0.2 %   Neutrophils Relative % 80 %   Neutro Abs 4.2 1.7 - 7.7 K/uL   Lymphocytes Relative 7 %   Lymphs Abs 0.4 (L) 0.7 - 4.0 K/uL   Monocytes Relative 11 %   Monocytes Absolute 0.6 0.1 - 1.0 K/uL   Eosinophils Relative 0 %   Eosinophils Absolute 0.0 0.0 - 0.5 K/uL   Basophils Relative  1 %   Basophils Absolute 0.0 0.0 - 0.1 K/uL   WBC Morphology DOHLE BODIES    RBC Morphology MORPHOLOGY UNREMARKABLE    Smear Review MORPHOLOGY UNREMARKABLE    Immature Granulocytes 1 %   Abs Immature Granulocytes 0.03 0.00 - 0.07 K/uL    Comment: Performed at [facility], [place] [address], [place], [place] [address]  Magnesium     Status: None   Collection Time: 05/05/22  1:23 AM  Result Value Ref Range   Magnesium 2.4 1.7 - 2.4 mg/dL    Comment: Performed at [facility] [address], [place], [place] [address]  Phosphorus     Status: Abnormal   Collection Time: 05/05/22  1:23 AM  Result Value Ref Range   Phosphorus 2.1 (L) 2.5 - 4.6 mg/dL    Comment: Performed at [facility] [address], [place], [place] [address]  Brain natriuretic peptide     Status: Abnormal   Collection Time: 05/05/22  1:23 AM  Result Value Ref Range   B Natriuretic Peptide 158.3 (H) 0.0 - 100.0 pg/mL    Comment: Performed at [facility] [address], [place], [place] [address]  Hemoglobin A1c     Status: None   Collection Time: 05/05/22  1:23 AM  Result Value Ref Range   Hgb A1c MFr Bld 5.5 4.8 - 5.6 %    Comment: (NOTE) Pre diabetes:          5.7%-6.4%  Diabetes:              >6.4%  Glycemic control for   <7.0% adults with diabetes    Mean Plasma Glucose 111.15 mg/dL    Comment: Performed at [facility] [address] [place],  [address]  Glucose, capillary     Status: Abnormal   Collection Time: 05/05/22  4:18 AM  Result Value Ref Range   Glucose-Capillary 114 (H) 70 - 99 mg/dL    Comment: Glucose reference range applies only to samples taken after fasting for at least  8 hours.  Glucose, capillary     Status: Abnormal   Collection Time: 05/05/22  7:50 AM  Result Value Ref Range   Glucose-Capillary 129 (H) 70 - 99 mg/dL    Comment: Glucose reference range applies only to samples taken after fasting for at least 8 hours.   Comment 1 Notify RN    Glucose, capillary     Status: None   Collection Time: 05/05/22 11:12 AM  Result Value Ref Range   Glucose-Capillary 89 70 - 99 mg/dL    Comment: Glucose reference range applies only to samples taken after fasting for at least 8 hours.   Comment 1 Notify RN   Basic metabolic panel     Status: Abnormal   Collection Time: 05/05/22  3:54 PM  Result Value Ref Range   Sodium 144 135 - 145 mmol/L   Potassium 3.2 (L) 3.5 - 5.1 mmol/L   Chloride 112 (H) 98 - 111 mmol/L   CO2 22 22 - 32 mmol/L   Glucose, Bld 165 (H) 70 - 99 mg/dL    Comment: Glucose reference range applies only to samples taken after fasting for at least 8 hours.   BUN 28 (H) 8 - 23 mg/dL   Creatinine, Ser 1.10 0.61 - 1.24 mg/dL   Calcium 7.6 (L) 8.9 - 10.3 mg/dL   GFR, Estimated >60 >60 mL/min    Comment: (NOTE) Calculated using the CKD-EPI Creatinine Equation (2021)    Anion gap 10 5 - 15    Comment: Performed at [facility] [address], [place], [place] [address]  Glucose, capillary     Status: Abnormal   Collection Time: 05/05/22  4:35 PM  Result Value Ref Range   Glucose-Capillary 173 (H) 70 - 99 mg/dL    Comment: Glucose reference range applies only to samples taken after fasting for at least 8 hours.   Comment 1 Notify RN   Glucose, capillary     Status: Abnormal   Collection Time: 05/05/22  8:10 PM  Result Value Ref Range   Glucose-Capillary 146 (H) 70 - 99 mg/dL    Comment: Glucose reference range applies only to samples taken after fasting for at least 8 hours.  Glucose, capillary     Status: Abnormal   Collection Time: 05/06/22 12:57 AM  Result Value Ref Range   Glucose-Capillary 145 (H) 70 - 99 mg/dL    Comment: Glucose reference range applies only to samples taken after fasting for at least 8 hours.  CBC with Differential/Platelet     Status: Abnormal   Collection Time: 05/06/22  1:09 AM  Result Value Ref Range   WBC 6.0 4.0 - 10.5 K/uL   RBC 3.95 (L) 4.22 - 5.81 MIL/uL   Hemoglobin 11.3  (L) 13.0 - 17.0 g/dL   HCT 34.2 (L) 39.0 - 52.0 %   MCV 86.6 80.0 - 100.0 fL   MCH 28.6 26.0 - 34.0 pg   MCHC 33.0 30.0 - 36.0 g/dL   RDW 15.3 11.5 - 15.5 %   Platelets 206 150 - 400 K/uL   nRBC 0.0 0.0 - 0.2 %   Neutrophils Relative % 70 %   Neutro Abs 4.2 1.7 - 7.7 K/uL   Lymphocytes Relative 16 %   Lymphs Abs 1.0 0.7 - 4.0 K/uL   Monocytes Relative 14 %   Monocytes Absolute 0.8 0.1 - 1.0 K/uL   Eosinophils Relative 0 %   Eosinophils Absolute 0.0 0.0 - 0.5 K/uL   Basophils  Relative 0 %   Basophils Absolute 0.0 0.0 - 0.1 K/uL   nRBC 0 0 /100 WBC   Abs Immature Granulocytes 0.00 0.00 - 0.07 K/uL    Comment: Performed at [facility], [place] [address], [place], [place] [address]  Comprehensive metabolic panel     Status: Abnormal   Collection Time: 05/06/22  1:09 AM  Result Value Ref Range   Sodium 149 (H) 135 - 145 mmol/L   Potassium 3.2 (L) 3.5 - 5.1 mmol/L   Chloride 115 (H) 98 - 111 mmol/L   CO2 26 22 - 32 mmol/L   Glucose, Bld 142 (H) 70 - 99 mg/dL    Comment: Glucose reference range applies only to samples taken after fasting for at least 8 hours.   BUN 27 (H) 8 - 23 mg/dL   Creatinine, Ser 1.15 0.61 - 1.24 mg/dL   Calcium 7.6 (L) 8.9 - 10.3 mg/dL   Total Protein 5.2 (L) 6.5 - 8.1 g/dL   Albumin 1.8 (L) 3.5 - 5.0 g/dL   AST 34 15 - 41 U/L   ALT 19 0 - 44 U/L   Alkaline Phosphatase 43 38 - 126 U/L   Total Bilirubin 0.8 0.3 - 1.2 mg/dL   GFR, Estimated >60 >60 mL/min    Comment: (NOTE) Calculated using the CKD-EPI Creatinine Equation (2021)    Anion gap 8 5 - 15    Comment: Performed at [facility], [place] [address], [place], [place] [address]  Magnesium     Status: None   Collection Time: 05/06/22  1:09 AM  Result Value Ref Range   Magnesium 2.1 1.7 - 2.4 mg/dL    Comment: Performed at [facility] [address] [place] [address] [place], [place] [address]  Phosphorus     Status: Abnormal   Collection Time: 05/06/22  1:09 AM  Result Value Ref Range    Phosphorus 1.7 (L) 2.5 - 4.6 mg/dL    Comment: Performed at [facility] [address] [place], [place] [address]  Glucose, capillary     Status: Abnormal   Collection Time: 05/06/22  5:13 AM  Result Value Ref Range   Glucose-Capillary 160 (H) 70 - 99 mg/dL    Comment: Glucose reference range applies only to samples taken after fasting for at least 8 hours.   Comment 1 Notify RN   Lactic acid, plasma     Status: None   Collection Time: 05/06/22  6:05 AM  Result Value Ref Range   Lactic Acid, Venous 1.9 0.5 - 1.9 mmol/L    Comment: Performed at [facility] [address] [place], [place] [address]  SARS Coronavirus 2 by RT PCR (hospital order, performed in [facility] hospital lab) *cepheid single result test* Anterior Nasal Swab     Status: None   Collection Time: 05/06/22  7:41 AM   Specimen: Anterior Nasal Swab  Result Value Ref Range   SARS Coronavirus 2 by RT PCR NEGATIVE NEGATIVE    Comment: (NOTE) SARS-CoV-2 target nucleic acids are NOT DETECTED.  The SARS-CoV-2 RNA is generally detectable in upper and lower respiratory specimens during the acute phase of infection. The lowest concentration of SARS-CoV-2 viral copies this assay can detect is 250 copies / mL. A negative result does not preclude SARS-CoV-2 infection and should not be used as the sole basis for treatment or other patient management decisions.  A negative result may occur with improper specimen collection / handling, submission  of specimen other than nasopharyngeal swab, presence of viral mutation(s) within the areas targeted by this assay, and inadequate number of viral copies (<250 copies / mL). A negative result must be combined with clinical observations, patient history, and epidemiological information.  Fact Sheet for Patients:   https://www.patel.info/  Fact Sheet for Healthcare Providers: https://hall.com/  This test is not yet approved or   cleared by the [place] FDA and has been authorized for detection and/or diagnosis of SARS-CoV-2 by FDA under an Emergency Use Authorization (EUA).  This EUA will remain in effect (meaning this test can be used) for the duration of the COVID-19 declaration under Section 564(b)(1) of the Act, 21 U.S.C. section 360bbb-3(b)(1), unless the authorization is terminated or revoked sooner.  Performed at [facility], [place] [address], [place], [place] [address]   Glucose, capillary     Status: Abnormal   Collection Time: 05/06/22  8:07 AM  Result Value Ref Range   Glucose-Capillary 178 (H) 70 - 99 mg/dL    Comment: Glucose reference range applies only to samples taken after fasting for at least 8 hours.  Lactic acid, plasma     Status: Abnormal   Collection Time: 05/06/22  8:49 AM  Result Value Ref Range   Lactic Acid, Venous 2.1 (HH) 0.5 - 1.9 mmol/L    Comment: CRITICAL RESULT CALLED TO, READ BACK BY AND VERIFIED WITH: L.GUIRAL,RN 05/06/2022 AT 0933 A.HUGHES Performed at [facility], [place] [address], [place], [place] [address]   Brain natriuretic peptide     Status: Abnormal   Collection Time: 05/06/22  8:49 AM  Result Value Ref Range   B Natriuretic Peptide 162.9 (H) 0.0 - 100.0 pg/mL    Comment: Performed at [facility] [address], [place], [place] [address]  Glucose, capillary     Status: Abnormal   Collection Time: 05/06/22 11:55 AM  Result Value Ref Range   Glucose-Capillary 119 (H) 70 - 99 mg/dL    Comment: Glucose reference range applies only to samples taken after fasting for at least 8 hours.    Studies/Results: DG CHEST PORT 1 VIEW  Result Date: 05/04/2022 CLINICAL DATA:  Shortness of breath EXAM: PORTABLE CHEST 1 VIEW COMPARISON:  05/04/2022, CT 04/25/2022, FINDINGS: Post sternotomy changes. Esophageal tube is looped at the lower chest within a hiatal hernia, tip below the diaphragm and probably within the distal stomach. Cardiomegaly with  vascular congestion and hazy lung fields likely due to mild edema. There is a small left-sided effusion. IMPRESSION: 1. Cardiomegaly with mild vascular congestion and hazy pulmonary opacity possible edema. Small left-sided effusion. 2. Esophageal tube is looped at the lower chest within a hiatal hernia with the tip position below the diaphragm and probably within the distal stomach. Electronically Signed   By: Donavan Foil M.D.   On: 05/04/2022 21:23   DG Abd Portable 1V  Result Date: 05/04/2022 CLINICAL DATA:  [id number].  Feeding tube placement EXAM: PORTABLE ABDOMEN - 1 VIEW COMPARISON:  X-ray abdomen 62/2/23 2:25 p.m., CT abdomen pelvis 05/02/2022, CT abdomen pelvis 04/14/2022 FINDINGS: Hiatal hernia. Interval advancement of an enteric tube with tip overlying the expected region of the gastric lumen. The bowel gas pattern is normal. No radio-opaque calculi or other significant radiographic abnormality are seen. Aortic calcification. IMPRESSION: 1. Enteric tube with tip overlying the expected region of the gastric lumen. 2. Hiatal hernia. 3.  Aortic Atherosclerosis (ICD10-I70.0). Electronically Signed   By: Clelia Croft.D.  On: 05/04/2022 15:51   DG Abd Portable 1V  Result Date: 05/04/2022 CLINICAL DATA:  Feeding tube placement. EXAM: PORTABLE ABDOMEN - 1 VIEW COMPARISON:  05/04/2022 at 12:32 p.m. FINDINGS: Enteric tube tip projects in the medial left upper quadrant consistent with positioning at or just below the expected location of the gastroesophageal junction. Normal bowel gas pattern. IMPRESSION: 1. Enteric feeding tube tip lies at the expected location of the gastroesophageal junction. This will need to be further inserted, at least 20 cm, to allow adequate length for the tube to into the duodenum. Electronically Signed   By: Lajean Manes M.D.   On: 05/04/2022 14:41    Medications: I have reviewed the patient's current medications.  Assessment: Severe pancreatitis, presumed to be related to  significant alcohol use Febrile, temperature 1 1.9 F today, tachycardic, tachypneic, hypoxic  Potassium 3.2 Sodium 149 BUN 27, creatinine 1.15, GFR more than 60 Normal LFTs Normal WBC, hemoglobin 11.3 CT abdomen pelvis has been performed, results pending Due to clinical deterioration, will need to consider complication of pancreatitis such as pseudocyst formation, necrosis or abscess. Possibility of worsening of bronchopneumonia needs to be considered as well. Possibility of worsening tachycardia from alcohol withdrawal  Plan: Follow results of CAT scan Continue supportive management with NG tube feeding, continuation of IV Zosyn, pantoprazole once a day, thiamine, multivitamin and folic acid, electrolyte supplementation as needed.  Ronnette Juniper, MD 05/06/2022, 1:47 PM

## 2022-05-07 DIAGNOSIS — K859 Acute pancreatitis without necrosis or infection, unspecified: Secondary | ICD-10-CM | POA: Diagnosis not present

## 2022-05-07 LAB — GLUCOSE, CAPILLARY
Glucose-Capillary: 159 mg/dL — ABNORMAL HIGH (ref 70–99)
Glucose-Capillary: 127 mg/dL — ABNORMAL HIGH (ref 70–99)
Glucose-Capillary: 139 mg/dL — ABNORMAL HIGH (ref 70–99)
Glucose-Capillary: 150 mg/dL — ABNORMAL HIGH (ref 70–99)
Glucose-Capillary: 176 mg/dL — ABNORMAL HIGH (ref 70–99)

## 2022-05-07 LAB — URINALYSIS, ROUTINE W REFLEX MICROSCOPIC
Bacteria, UA: NONE SEEN
Bilirubin Urine: NEGATIVE
Glucose, UA: 50 mg/dL — AB
Hgb urine dipstick: NEGATIVE
Ketones, ur: NEGATIVE mg/dL
Leukocytes,Ua: NEGATIVE
Nitrite: NEGATIVE
Protein, ur: 100 mg/dL — AB
Specific Gravity, Urine: 1.023 (ref 1.005–1.030)
pH: 7 (ref 5.0–8.0)

## 2022-05-07 LAB — COMPREHENSIVE METABOLIC PANEL
ALT: 16 U/L (ref 0–44)
AST: 37 U/L (ref 15–41)
Albumin: 1.7 g/dL — ABNORMAL LOW (ref 3.5–5.0)
Alkaline Phosphatase: 43 U/L (ref 38–126)
Anion gap: 6 (ref 5–15)
BUN: 23 mg/dL (ref 8–23)
CO2: 26 mmol/L (ref 22–32)
Calcium: 7.5 mg/dL — ABNORMAL LOW (ref 8.9–10.3)
Chloride: 113 mmol/L — ABNORMAL HIGH (ref 98–111)
Creatinine, Ser: 1.08 mg/dL (ref 0.61–1.24)
GFR, Estimated: >60 mL/min (ref >60)
Glucose, Bld: 163 mg/dL — ABNORMAL HIGH (ref 70–99)
Potassium: 3.3 mmol/L — ABNORMAL LOW (ref 3.5–5.1)
Sodium: 145 mmol/L (ref 135–145)
Total Bilirubin: 0.6 mg/dL (ref 0.3–1.2)
Total Protein: 4.9 g/dL — ABNORMAL LOW (ref 6.5–8.1)

## 2022-05-07 LAB — MAGNESIUM: Magnesium: 2 mg/dL (ref 1.7–2.4)

## 2022-05-07 LAB — BASIC METABOLIC PANEL
Anion gap: 8 (ref 5–15)
BUN: 26 mg/dL — ABNORMAL HIGH (ref 8–23)
CO2: 25 mmol/L (ref 22–32)
Calcium: 8 mg/dL — ABNORMAL LOW (ref 8.9–10.3)
Chloride: 114 mmol/L — ABNORMAL HIGH (ref 98–111)
Creatinine, Ser: 1.11 mg/dL (ref 0.61–1.24)
GFR, Estimated: >60 mL/min (ref >60)
Glucose, Bld: 180 mg/dL — ABNORMAL HIGH (ref 70–99)
Potassium: 4.2 mmol/L (ref 3.5–5.1)
Sodium: 147 mmol/L — ABNORMAL HIGH (ref 135–145)

## 2022-05-07 LAB — BRAIN NATRIURETIC PEPTIDE: B Natriuretic Peptide: 115.7 pg/mL — ABNORMAL HIGH (ref 0.0–100.0)

## 2022-05-07 LAB — CBC WITH DIFFERENTIAL/PLATELET
Abs Immature Granulocytes: 0.1 10*3/uL — ABNORMAL HIGH (ref 0.00–0.07)
Basophils Absolute: 0.1 10*3/uL (ref 0.0–0.1)
Basophils Relative: 1 %
Eosinophils Absolute: 0.1 10*3/uL (ref 0.0–0.5)
Eosinophils Relative: 1 %
HCT: 33.9 % — ABNORMAL LOW (ref 39.0–52.0)
Hemoglobin: 10.9 g/dL — ABNORMAL LOW (ref 13.0–17.0)
Immature Granulocytes: 1 %
Lymphocytes Relative: 8 %
Lymphs Abs: 0.8 10*3/uL (ref 0.7–4.0)
MCH: 28.3 pg (ref 26.0–34.0)
MCHC: 32.2 g/dL (ref 30.0–36.0)
MCV: 88.1 fL (ref 80.0–100.0)
Monocytes Absolute: 0.8 10*3/uL (ref 0.1–1.0)
Monocytes Relative: 8 %
Neutro Abs: 8.2 10*3/uL — ABNORMAL HIGH (ref 1.7–7.7)
Neutrophils Relative %: 81 %
Platelets: 185 10*3/uL (ref 150–400)
RBC: 3.85 MIL/uL — ABNORMAL LOW (ref 4.22–5.81)
RDW: 15.5 % (ref 11.5–15.5)
WBC: 10.1 10*3/uL (ref 4.0–10.5)
nRBC: 0 % (ref 0.0–0.2)

## 2022-05-07 LAB — PROCALCITONIN: Procalcitonin: 2.08 ng/mL

## 2022-05-07 LAB — PHOSPHORUS: Phosphorus: 2.6 mg/dL (ref 2.5–4.6)

## 2022-05-07 MED ORDER — KETOROLAC TROMETHAMINE 15 MG/ML IJ SOLN: 15.0000 mg | Freq: Four times a day (QID) | INTRAMUSCULAR | Status: DC | PRN

## 2022-05-07 MED ORDER — FUROSEMIDE 10 MG/ML IJ SOLN: 40.0000 mg | Freq: Once | INTRAMUSCULAR | Status: DC

## 2022-05-07 MED ORDER — POTASSIUM CHLORIDE CRYS ER 20 MEQ PO TBCR
40.0000 meq | EXTENDED_RELEASE_TABLET | ORAL | Status: AC
  Administered 2022-05-07 (×2): 40 meq via ORAL
  Filled 2022-05-07 (×2): qty 2

## 2022-05-07 MED ORDER — POTASSIUM CL IN DEXTROSE 5% 20 MEQ/L IV SOLN
20.0000 meq | INTRAVENOUS | Status: DC
  Administered 2022-05-07: 20 meq via INTRAVENOUS
  Filled 2022-05-07: qty 1000

## 2022-05-07 MED ORDER — DEXTROSE 5 % IV SOLN: INTRAVENOUS | Status: DC

## 2022-05-07 MED ORDER — POTASSIUM CL IN DEXTROSE 5% 20 MEQ/L IV SOLN
20.0000 meq | INTRAVENOUS | Status: DC
  Filled 2022-05-07: qty 1000

## 2022-05-07 MED ORDER — FUROSEMIDE 10 MG/ML IJ SOLN
40.0000 mg | Freq: Once | INTRAMUSCULAR | Status: AC
  Administered 2022-05-07: 40 mg via INTRAVENOUS
  Filled 2022-05-07: qty 4

## 2022-05-07 NOTE — Progress Notes (Signed)
PT Cancellation Note  Patient Details Name: FRANKI STEMEN MRN: [id number] DOB: 10-16-1946   Cancelled Treatment:    Reason Eval/Treat Not Completed: (P) Medical issues which prohibited therapy (defer due to unstable vitals per RN requesting no OOB and pt fatigued after working with OT.) Will continue efforts next date per PT plan of care as schedule permits.   Rahm Minix M Jennene Downie 05/07/2022, 1:08 PM

## 2022-05-07 NOTE — Progress Notes (Addendum)
Patient is tachypneic and tachycardic. Patient confused but is still able to tell me he is not hurting. Paged [facility] about his IVF, got orders to dc. Vtials 125/82, 99.65F, HR 118, RR 31. Will continue to monitor patient. Closely.     0430: On call MD Opyd at bedside to assess. No new interventions. Will continue to monitor closely.

## 2022-05-07 NOTE — Progress Notes (Signed)
[facility] Progress Note  Russell Hebert 76 y.o. 1946-01-09  CC:  Severe pancreatitis   Subjective: On 4L oxygen by nasal cannula, able to speak in few words, not full sentences.   ROS : Review of Systems  Gastrointestinal:  Negative for abdominal pain.     Objective: Vital signs in last 24 hours: Vitals:   05/07/22 0810 05/07/22 1125  BP: 108/83 116/66  Pulse: (!) 126 93  Resp:  (!) 41  Temp:  99 F (37.2 C)  SpO2:  96%    Physical Exam:  General:  Arouseable to voice, somnolent, no acute distress, responds minimally to questions  Head:  Normocephalic, atraumatic        Heart:  Regular rate and rhythm, S1, S2 normal  Abdomen:   Soft, non-tender, bowel sounds active all four quadrants   Extremities: No deformity       Lab Results: Recent Labs    05/06/22 0109 05/06/22 1416 05/07/22 0011  NA 149* 144 145  K 3.2* 3.9 3.3*  CL 115* 113* 113*  CO2 '26 22 26  '$ GLUCOSE 142* 113* 163*  BUN 27* 26* 23  CREATININE 1.15 0.95 1.08  CALCIUM 7.6* 7.3* 7.5*  MG 2.1  --  2.0  PHOS 1.7*  --  2.6   Recent Labs    05/06/22 0109 05/07/22 0011  AST 34 37  ALT 19 16  ALKPHOS 43 43  BILITOT 0.8 0.6  PROT 5.2* 4.9*  ALBUMIN 1.8* 1.7*   Recent Labs    05/06/22 0109 05/07/22 0011  WBC 6.0 10.1  NEUTROABS 4.2 8.2*  HGB 11.3* 10.9*  HCT 34.2* 33.9*  MCV 86.6 88.1  PLT 206 185   No results for input(s): LABPROT, INR in the last 72 hours.    Assessment Severe pancreatitis - CT A/P 05/06/2022 shows no significant change in severe acute pancreatitis, with retroperitoneal fluid tracking inferiorly within the right anterior and posterior pararenal spaces.  Increased moderate bilateral pleural effusions and dependent lower lobe atelectasis.  Stable large hiatal hernia.  - RUQ [place] 04/30/2022 shows cholelithiasis with borderline gallbladder wall thickening.  No biliary dilation or visible choledocholithiasis.    - Normal LFTs, bilirubin 0.6 - No  leukocytosis, hemoglobin 10.9. - Lipase 112 on 05/04/2022, improved from 6,730 on admission  SIRS  - Fever, tachycardia, tachypnea - On Zosyn  Acute respiratory failure with hypoxia - On 4L oxygen by nasal cannula  Acute kidney injury  - IVF on hold  Acute metabolic encephalopathy - Possibly related to opiates, acute hospitalization (delirium), pain, AKI  Plan: CT without change in pancreatitis, normal LFTs, lipase improved, no evidence of pseudocyst or abscess. Differential includes alcohol-induced vs gallstone pancreatitis. Continue supportive care including IV Zosyn and daily PPI. Continue NG tube feeding. [facility] will follow.  Angelique Holm PA-C 05/07/2022, 1:25 PM  Contact #  [phone]

## 2022-05-07 NOTE — Progress Notes (Signed)
Nutrition Follow-up  DOCUMENTATION CODES:   Severe malnutrition in context of social or environmental circumstances  INTERVENTION:   Continue tube feeds via Cortrak: - Vital 1.5 @ 60 ml/hr (1440 ml/day)  Tube feeding regimen provides 2160 kcal, 97 grams of protein, and 1100 ml of H2O.   - Continue MVI with minerals daily  - Encourage PO intake as able and provide feeding assistance as needed  NUTRITION DIAGNOSIS:   Severe Malnutrition related to social / environmental circumstances (etoh use, inadequate PO) as evidenced by severe fat depletion, severe muscle depletion.  Ongoing, being addressed via diet advancement and tube feeds  GOAL:   Patient will meet greater than or equal to 90% of their needs  Met via TF  MONITOR:   PO intake, Labs, Weight trends, TF tolerance, I & O's  REASON FOR ASSESSMENT:   New TF, Consult Enteral/tube feeding initiation and management  ASSESSMENT:   Pt with hx of parkinson's, GERD, HTN, and CAD presented to ED with severe back pain. Found to have acute pancreatitis.  06/02 - Cortrak placed (tip gastric) 06/03 - diet advanced to dysphagia 1 with nectar-thick liquids  Spoke with pt briefly at bedside. Pt sleeping at time of RD visit. Noted untouched dysphagia 1/nectar breakfast meal tray at bedside. Pt awoke briefly to RD voice and touch. RD palpated abdominal region and distension seems improved. Pt denies any abdominal pain at this time. Tube feeds infusing at goal rate.  Suspect lethargy and confusion are main factors impacting pt's PO intake at this point. Will continue tube feeds at goal rate.  Admit weight: 71.8 kg Current weight: 72.6 kg  Pt with mild pitting edema to BLE.  Current TF: Vital 1.5 @ 60 ml/hr  Meal Completion: 0%  Medications reviewed and include: sinemet, folic acid, MVI with minerals, IV protonix, klor-con 40 mEq x 2, IV thiamine, IV abx IVF: D5 with KCl 20 mEq @ 50 ml/hr  Labs reviewed: potassium  3.3 CBG's: 119-159 x 24 hours  Diet Order:   Diet Order             DIET - DYS 1 Room service appropriate? Yes with Assist; Fluid consistency: Nectar Thick  Diet effective now                   EDUCATION NEEDS:   Not appropriate for education at this time  Skin:  Skin Assessment: Reviewed RN Assessment  Last BM:  05/06/22  Height:   Ht Readings from Last 1 Encounters:  05/04/22 _0  (1.702 m)    Weight:   Wt Readings from Last 1 Encounters:  05/07/22 72.6 kg    Ideal Body Weight:  67.3 kg  BMI:  Body mass index is 25.07 kg/m.  Estimated Nutritional Needs:   Kcal:  2000-2200 kcal/d  Protein:  100-115 g/d  Fluid:  >2L/d    Gustavus Bryant, MS, RD, LDN Inpatient Clinical Dietitian Please see [facility] for contact information.

## 2022-05-07 NOTE — Progress Notes (Signed)
Speech Language Pathology Treatment: Dysphagia  Patient Details Name: Russell Hebert MRN: [id number] DOB: 09-20-1946 Today's Date: 05/07/2022 Time: 1410-3013 SLP Time Calculation (min) (ACUTE ONLY): 22 min  Assessment / Plan / Recommendation Clinical Impression  Pt with worsened mental status since evaluated on 6/3. Oral suctioning equipment was set up and oral care provided - tongue with thick thrush-like surface, not improved with oral care. Pt accepted several ice chips - leading to inconsistent coughing.  He ate several bites of pudding, requiring verbal/tactile cues to attend and initiate a swallow response.  He was tachypneic throughout session - up to low 40s.  Provided rest breaks as needed between bites.  Recommend continuing frequent oral care; encourage at least some oral intake each day.  SLP will follow for readiness for diet advancement as mental status improves. D/W RN.   HPI HPI: 76 y.o. male admitted with pancreatitis, acute respiratory failure with hypoxia, multilobar pna, AKI, encephalopathy. Cortrak 6/2. Transferred to [facility] on 6/4 due to tachycardia, tachypnea. PMHx parkinsonian syndrome, GERD, hypertension, CAD s/p CABG, history of fall in December resulting in lumbar spine fracture.      SLP Plan  Continue with current plan of care      Recommendations for follow up therapy are one component of a multi-disciplinary discharge planning process, led by the attending physician.  Recommendations may be updated based on patient status, additional functional criteria and insurance authorization.    Recommendations  Diet recommendations: Dysphagia 1 (puree);Nectar-thick liquid Liquids provided via: Cup;Teaspoon Medication Administration: Via alternative means Supervision: Full supervision/cueing for compensatory strategies Compensations: Minimize environmental distractions;Small sips/bites                Oral Care Recommendations: Oral care BID Assistance recommended  at discharge: Frequent or constant Supervision/Assistance SLP Visit Diagnosis: Dysphagia, oropharyngeal phase (R13.12) Plan: Continue with current plan of care         Russell Cowans L. Tivis Ringer, [place] CCC/SLP Acute Rehabilitation Services Office number [phone] Pager [phone]   Russell Hebert  05/07/2022, 2:49 PM

## 2022-05-07 NOTE — Evaluation (Signed)
Occupational Therapy Evaluation Patient Details Name: Russell Hebert MRN: [id number] DOB: 06/21/1946 Today's Date: 05/07/2022   History of Present Illness 76 y.o. male admitted with pancreatitis, acute respiratory failure with hypoxia, multilobar pna, AKI, encephalopathy. Cortrak 6/2. Transferred to [facility]  on 6/4 due to tachycardic, tachypneic and febrile. PMHx parkinsonian syndrome, GERD, hypertension, CAD s/p CABG, history of fall in December resulting in lumbar spine fracture.   Clinical Impression   This 76 yo male admitted with above presents to acute OT with PLOF per his report of being totally independent with ADLs and most IADLs, driving and ambulating with SPC. He currently is total A for all basic ADLs at bed level and he has tachycardia, increased RR, and decreased sats. He will continue to benefit from acute OT with follow up at SNF. Eval limited to bed level today at request of RN due to vitals.     Recommendations for follow up therapy are one component of a multi-disciplinary discharge planning process, led by the attending physician.  Recommendations may be updated based on patient status, additional functional criteria and insurance authorization.   Follow Up Recommendations  Skilled nursing-short term rehab (<3 hours/day)    Assistance Recommended at Discharge Frequent or constant Supervision/Assistance  Patient can return home with the following A lot of help with walking and/or transfers;A lot of help with bathing/dressing/bathroom;Assistance with cooking/housework;Assistance with feeding;Help with stairs or ramp for entrance;Assist for transportation;Direct supervision/assist for financial management;Direct supervision/assist for medications management    Functional Status Assessment  Patient has had a recent decline in their functional status and demonstrates the ability to make significant improvements in function in a reasonable and predictable amount of time.  Equipment  Recommendations  Other (comment) (TBD next venue)       Precautions / Restrictions Precautions Precautions: Fall Precaution Comments: monitor vitals (HR high, RR high, Sats low) Restrictions Weight Bearing Restrictions: No      Mobility Bed Mobility Overal bed mobility: Needs Assistance Bed Mobility: Rolling Rolling: Mod assist, Min assist (Mod A left, min A right)                      ADL either performed or assessed with clinical judgement   ADL Overall ADL's : Needs assistance/impaired Eating/Feeding: Total assistance;Bed level   Grooming: Wash/dry face;Moderate assistance;Bed level Grooming Details (indicate cue type and reason): VCs and A for thoroughness Upper Body Bathing: Total assistance;Bed level   Lower Body Bathing: Total assistance;Bed level   Upper Body Dressing : Total assistance;Bed level   Lower Body Dressing: Total assistance;Bed level                       Vision Patient Visual Report: No change from baseline              Pertinent Vitals/Pain Pain Assessment Pain Assessment: No/denies pain     Hand Dominance Right   Extremity/Trunk Assessment Upper Extremity Assessment Upper Extremity Assessment: Generalized weakness           Communication Communication Communication:  (soft spoken)   Cognition Arousal/Alertness: Awake/alert Behavior During Therapy: Flat affect Overall Cognitive Status: Impaired/Different from baseline Area of Impairment: Orientation, Following commands, Safety/judgement, Awareness, Problem solving                 Orientation Level: Disoriented to, Place, Time, Situation (1996, could not tell me month or where he was)     Following Commands: Follows one step commands inconsistently, Follows  one step commands with increased time Safety/Judgement: Decreased awareness of safety, Decreased awareness of deficits Awareness: Intellectual Problem Solving: Slow processing, Difficulty  sequencing, Requires verbal cues, Requires tactile cues       General Comments  HR remained in 120s throughout, pt on 5 liters of O2 with sats dropping as low as 88 with just him talking, RR ranged from 27-44            Home Living Family/patient expects to be discharged to:: Private residence Living Arrangements: Alone   Type of Home: House Home Access: Stairs to enter [employer] of Steps: 3 Entrance Stairs-Rails: Right;Left;Can reach both Home Layout: One level     Bathroom Shower/Tub: Teacher, early years/pre: Standard     Home Equipment: Conservation officer, nature (2 wheels);Rollator (4 wheels);Cane - quad;Tub bench;BSC/3in1;Wheelchair - manual          Prior Functioning/Environment Prior Level of Function : Independent/Modified Independent;History of Falls (last six months)             Mobility Comments: pt reports he drives and primarily uses his rollator when he goes out of the home. Also uses rollator in home ADLs Comments: Pt reports full independence with ADLs (Per PT eval 2 days ago)        OT Problem List: Decreased strength;Decreased range of motion;Impaired balance (sitting and/or standing);Decreased safety awareness;Decreased cognition      OT Treatment/Interventions: Self-care/ADL training;DME and/or AE instruction;Patient/family education;Balance training    OT Goals(Current goals can be found in the care plan section) Acute Rehab OT Goals Patient Stated Goal: agreeable to work with therapy OT Goal Formulation: With patient Time For Goal Achievement: 05/21/22 Potential to Achieve Goals: Fair  OT Frequency: Min 2X/week       AM-PAC OT "6 Clicks" Daily Activity     Outcome Measure Help from another person eating meals?: Total Help from another person taking care of personal grooming?: Total Help from another person toileting, which includes using toliet, bedpan, or urinal?: Total Help from another person bathing (including  washing, rinsing, drying)?: Total Help from another person to put on and taking off regular upper body clothing?: Total Help from another person to put on and taking off regular lower body clothing?: Total 6 Click Score: 6   End of Session Nurse Communication: Mobility status (how is vitals did during session)  Activity Tolerance:  (limited by increased/decreased vitals) Patient left: in bed;with call bell/phone within reach;with chair alarm set  OT Visit Diagnosis: Other abnormalities of gait and mobility (R26.89);Muscle weakness (generalized) (M62.81);Other symptoms and signs involving cognitive function                Time: 1240-1253 OT Time Calculation (min): 13 min Charges:  OT General Charges $OT Visit: 1 Visit OT Evaluation $OT Eval Moderate Complexity: Bennett, OTR/L Acute [employer] [facility] [phone] Office [phone]    Almon Register 05/07/2022, 2:08 PM

## 2022-05-07 NOTE — Progress Notes (Addendum)
PROGRESS NOTE    Russell Hebert  SWH:[id number] DOB: 01-Apr-1946 DOA: 04/09/2022 PCP: Alroy Dust, L.Marlou Sa, MD  Chief Complaint  Patient presents with   Back Pain    Brief Narrative:  Russell Hebert is Russell Hebert 76 y.o. male with medical history significant of parkinsonian syndrome, GERD, hypertension, CAD s/p CABG, history of fall in December resulting in lumbar spine fracture, presented to ED with Russell Hebert complaint of back pain.  Lived independently and walked with walker prior to admission.  Drank 3-4 beers 2-3x Kristalynn Coddington week, but last beer 4 days piror to admission.  CT with findings concerning for pancreatitis.  Hospital course complicated by delirium and uncontrolled pain.    GI consulted 6/2.   See below for additional details     Assessment & Plan:   Principal Problem:   Pancreatitis Active Problems:   SIRS (systemic inflammatory response syndrome) (HCC)   Acute respiratory failure with hypoxia (HCC)   Sinus tachycardia   Acute metabolic encephalopathy   AKI (acute kidney injury) ([place])   Metabolic acidosis   Hypernatremia   Hyperkalemia   Elevated LFTs   Hypotension   Alcohol abuse   Ascending aortic aneurysm (HCC)   Hypoglycemia   Hiatal hernia   Dysphagia   Parkinsonism ([place])   History of recent steroid use   Anxiety   Protein-calorie malnutrition, severe (HCC)   Hypertension   GERD (gastroesophageal reflux disease)   Hx of CABG   Carotid artery disease (HCC)   Hypertensive urgency   Hypokalemia   Leukocytosis   Assessment and Plan: * Pancreatitis CT at presentation with moderate non complicated pancreatitis Repeat CT with progressive acute pancreatitis Given RUQ [place] with cholelithiasis, suspect this is most likely gallstone pancreatitis.  Etoh pancreatitis also on ddx (though he reported to admitting provider not drinking for several days prior to admission - will clarify when he's better able).  Normal triglycerides.  No obvious meds that stand out as cause. Holding IVF  (ok for hypotonic fluids with hypernatremia) at this point with concerns for volume overload, diuresing as tolerated Abdominal exam [name] bit better today, still distended, but less pain on exam Repeat CT scan as above, severe acute pancreatitis cortrak placed 6/2 - tube feeds per RD, seems to be tolerating this well.  Dysphagia 1 diet, nectar thick liquids as tolerated.  Pain management, bowel regimen Appreciate GI recommendations   SIRS (systemic inflammatory response syndrome) (HCC) Fever, tachycardia, tachypnea Related to pneumonia vs pancreatitis  Blood cultures, repeat (prior from 6/2 NGTD) UA pending collection CXR with low lung volumes and LLL atelectasis CT abd/pelvis without sig change in severe acute pancreatitis, retroperitoneal fluid tracking inferiorly within R anterior and posterior pararenal spaces, increased moderate bilateral effusions and dependent lower lobe atelectasis covid negative Lactic 1.9, repeat 2.1 -> unclear significance, will follow Negative urine strep, negative MRSA PCR abx broadened to zosyn    Acute respiratory failure with hypoxia ([place]) Repeat CXR 6/4 as above Broaden abx to zosyn for fever Follow blood cultures NG (repeat pending), sputum cx if able, urine strep negative, MRSA PCR negative suspect component related to volume overload, follow BNP (elevated) and echo (poor quality study, normal EF, indeterminate diastolic parameters) Continue lasix daily as tolerated Continue I/O, daily weights  AKI (acute kidney injury) ([place]) CT without hydro Given imaging, likely contrast induced nephropathy, hemodynamically mediated with pancreatitis IVF on hold (ok for hypotonic fluids with his hypernatremia recently) Lasix daily as tolerated Strict I/O, daily weights   Acute metabolic encephalopathy Suspect  this is related to opiates, acute hospitalization, pain, acute kidney injury etc He's moving all extremities without any appreciated focal deficits - I  think everything (slowed/delayed responses/speech/hallucinations) at this time can be attributed to opiates and hospital delirium  ? Of whether this could be related to some etoh withdrawal as well, but per admission hx, doesn't quite line up (unclear how accurate) - hold off on ciwa protocol given hospital day 6 now  More encephalopathic this AM during my eval, he'd recently had fever, possible contributor? Follow with opiates  Delirium precautions High dose thiamine Workup additionally if persistent or worsening  Sinus tachycardia EKG with sinus tach, possible q in II, aVF appear chronic Seems likely related to pain from pancreatitis, respiratory status Now being diuresed as noted above Echo poor quality, normal EF Frequent PVC's, will start low dose metop - sinus tach, suspect cause pancreatitis/resp status, but given persistence will give Aldahir Litaker little beta blocker but will continue to treat underlying cause    Hypernatremia Continue hypotonic fluids  Metabolic acidosis resolved  Alcohol abuse Per admission H&P drinks 3-4 beers 2-3x/week, last drink 4 days prior to presentation Hx to GI is that he "drinks heavy alcohol on regular basis"  Thiamine, folate, mvi   Hypotension Related to opiates improved  Elevated LFTs Elevated alk phos and ast, mild Improved, follow  Hyperkalemia S/p lokelma Follow, now hypo  Hypoglycemia Due to poor PO intake D5 fluids, follow  Cortrak, tube feeds started A1c 5.5  Ascending aortic aneurysm (HCC) 4.2 cm, needs annual follow up imaging  Dysphagia He's pocketing pills, related to encephalopathy SLP eval, dysphagia 1/nectar thickened liquids  Hiatal hernia Esophageal air fluid level suggests dysmotility or GERD PPI daily  Parkinsonism (HCC) sinemet  Protein-calorie malnutrition, severe (HCC) RD, tube feeds  History of recent steroid use Per med rec, he's no longer taking this, will d/c       DVT prophylaxis:  lovenox Code Status: DNR Family Communication: daughter 84/5 Disposition:   Status is: Inpatient Remains inpatient appropriate because: additional w/u needed   Consultants:  none  Procedures:  none  Antimicrobials:  Anti-infectives (From admission, onward)    Start     Dose/Rate Route Frequency Ordered Stop   05/06/22 0915  piperacillin-tazobactam (ZOSYN) IVPB 3.375 g        3.375 g 12.5 mL/hr over 240 Minutes Intravenous Every 8 hours 05/06/22 0818     05/04/22 0830  Ampicillin-Sulbactam (UNASYN) 3 g in sodium chloride 0.9 % 100 mL IVPB  Status:  Discontinued        3 g 200 mL/hr over 30 Minutes Intravenous Every 6 hours 05/04/22 0740 05/06/22 0807       Subjective: confused  Objective: Vitals:   05/07/22 0347 05/07/22 0500 05/07/22 0746 05/07/22 0810  BP: 125/82 125/82 130/82 108/83  Pulse: (!) 117 95  (!) 126  Resp: (!) 33 (!) 28    Temp: 99.6 F (37.6 C) 99.1 F (37.3 C) (!) 102.5 F (39.2 C)   TempSrc: Oral Oral Oral   SpO2: 95%     Weight:  72.6 kg    Height:        Intake/Output Summary (Last 24 hours) at 05/07/2022 0950 Last data filed at 05/06/2022 2300 Gross per 24 hour  Intake 0 ml  Output 1450 ml  Net -1450 ml   Filed Weights   05/04/22 0600 05/06/22 0500 05/07/22 0500  Weight: 77.6 kg 71.4 kg 72.6 kg    Examination:  General: No acute  distress. Cardiovascular: tachycardic, regular Lungs: tachypneic, increased wob noted Abdomen: mildly distended, less tender Neurological: disoriented, difficult to get him to answer questions today, mostly staring blankly - moving all extremities Extremities: no LEE    Data Reviewed: I have personally reviewed following labs and imaging studies  CBC: Recent Labs  Lab 05/03/22 0603 05/04/22 0412 05/05/22 0123 05/06/22 0109 05/07/22 0011  WBC 9.5 5.6 5.2 6.0 10.1  NEUTROABS 8.5* 5.1 4.2 4.2 8.2*  HGB 12.9* 11.2* 11.8* 11.3* 10.9*  HCT 40.8 35.2* 36.4* 34.2* 33.9*  MCV 91.9 90.3 88.3 86.6 88.1   PLT 340 249 232 206 841    Basic Metabolic Panel: Recent Labs  Lab 05/03/22 0603 05/03/22 1559 05/04/22 0412 05/04/22 1630 05/05/22 0123 05/05/22 1554 05/06/22 0109 05/06/22 1416 05/07/22 0011  NA 141   < > 144   < > 146* 144 149* 144 145  K 5.4*   < > 4.2   < > 3.5 3.2* 3.2* 3.9 3.3*  CL 118*   < > 118*   < > 116* 112* 115* 113* 113*  CO2 18*   < > 19*   < > 23 22 26 22 26   GLUCOSE 92   < > 64*   < > 103* 165* 142* 113* 163*  BUN 39*   < > 38*   < > 32* 28* 27* 26* 23  CREATININE 2.51*   < > 1.46*   < > 1.20 1.10 1.15 0.95 1.08  CALCIUM 8.0*   < > 7.9*   < > 8.0* 7.6* 7.6* 7.3* 7.5*  MG 1.6*  --  2.2  --  2.4  --  2.1  --  2.0  PHOS 3.7  --  2.8  --  2.1*  --  1.7*  --  2.6   < > = values in this interval not displayed.    GFR: Estimated Creatinine Clearance: 53.6 mL/min (by C-G formula based on SCr of 1.08 mg/dL).  Liver Function Tests: Recent Labs  Lab 05/04/22 0412 05/04/22 1630 05/05/22 0123 05/06/22 0109 05/07/22 0011  AST 36 42* 41 34 37  ALT 6 9 10 19 16   ALKPHOS 48 49 50 43 43  BILITOT 1.4* 1.1 0.8 0.8 0.6  PROT 5.2* 5.2* 5.5* 5.2* 4.9*  ALBUMIN 2.1* 2.2* 2.3* 1.8* 1.7*    CBG: Recent Labs  Lab 05/06/22 0807 05/06/22 1155 05/06/22 1551 05/07/22 0406 05/07/22 0749  GLUCAP 178* 119* 132* 159* 127*     Recent Results (from the past 240 hour(s))  Urine Culture     Status: None   Collection Time: 05/02/22 12:41 PM   Specimen: Urine, Clean Catch  Result Value Ref Range Status   Specimen Description URINE, CLEAN CATCH  Final   Special Requests NONE  Final   Culture   Final    NO GROWTH Performed at [facility], [place] [address], [place], [place] [address]    Report Status 05/03/2022 FINAL  Final  MRSA Next Gen by PCR, Nasal     Status: None   Collection Time: 05/04/22  7:28 AM   Specimen: Nasal Mucosa; Nasal Swab  Result Value Ref Range Status   MRSA by PCR Next Gen NOT DETECTED NOT DETECTED Final    Comment: (NOTE) The GeneXpert  MRSA Assay (FDA approved for NASAL specimens only), is one component of Nechemia Chiappetta comprehensive MRSA colonization surveillance program. It is not intended to diagnose MRSA infection nor to guide or monitor treatment for MRSA infections.  Test performance is not FDA approved in patients less than 43 years old. Performed at [facility], [place] [address] [place], [place] [address]   Culture, blood (Routine X 2) w Reflex to ID Panel     Status: None (Preliminary result)   Collection Time: 05/04/22  8:39 AM   Specimen: BLOOD RIGHT FOREARM  Result Value Ref Range Status   Specimen Description BLOOD RIGHT FOREARM  Final   Special Requests   Final    BOTTLES DRAWN AEROBIC AND ANAEROBIC Blood Culture adequate volume   Culture   Final    NO GROWTH 3 DAYS Performed at [facility], [place] [address], [place], [place] [address]    Report Status PENDING  Incomplete  Culture, blood (Routine X 2) w Reflex to ID Panel     Status: None (Preliminary result)   Collection Time: 05/04/22  8:49 AM   Specimen: BLOOD RIGHT WRIST  Result Value Ref Range Status   Specimen Description BLOOD RIGHT WRIST  Final   Special Requests   Final    BOTTLES DRAWN AEROBIC AND ANAEROBIC Blood Culture adequate volume   Culture   Final    NO GROWTH 3 DAYS Performed at [facility], [address] [place], [place] [address]    Report Status PENDING  Incomplete  SARS Coronavirus 2 by RT PCR (hospital order, performed in [facility] hospital lab) *cepheid single result test* Anterior Nasal Swab     Status: None   Collection Time: 05/06/22  7:41 AM   Specimen: Anterior Nasal Swab  Result Value Ref Range Status   SARS Coronavirus 2 by RT PCR NEGATIVE NEGATIVE Final    Comment: (NOTE) SARS-CoV-2 target nucleic acids are NOT DETECTED.  The SARS-CoV-2 RNA is generally detectable in upper and lower respiratory specimens during the acute phase of infection. The lowest concentration of SARS-CoV-2 viral copies this assay  can detect is 250 copies / mL. Pedro Oldenburg negative result does not preclude SARS-CoV-2 infection and should not be used as the sole basis for treatment or other patient management decisions.  Taaliyah Delpriore negative result may occur with improper specimen collection / handling, submission of specimen other than nasopharyngeal swab, presence of viral mutation(s) within the areas targeted by this assay, and inadequate number of viral copies (<250 copies / mL). Nazario Russom negative result must be combined with clinical observations, patient history, and epidemiological information.  Fact Sheet for Patients:   https://www.patel.info/  Fact Sheet for Healthcare Providers: https://hall.com/  This test is not yet approved or  cleared by the [place] FDA and has been authorized for detection and/or diagnosis of SARS-CoV-2 by FDA under an Emergency Use Authorization (EUA).  This EUA will remain in effect (meaning this test can be used) for the duration of the COVID-19 declaration under Section 564(b)(1) of the Act, 21 U.S.C. section 360bbb-3(b)(1), unless the authorization is terminated or revoked sooner.  Performed at [facility], [place] [address] [place], [place] [address]   Culture, blood (Routine X 2) w Reflex to ID Panel     Status: None (Preliminary result)   Collection Time: 05/06/22  8:50 AM   Specimen: BLOOD LEFT HAND  Result Value Ref Range Status   Specimen Description BLOOD LEFT HAND  Final   Special Requests   Final    BOTTLES DRAWN AEROBIC AND ANAEROBIC Blood Culture adequate volume   Culture   Final    NO GROWTH < 24 HOURS Performed at [facility], [place] [address], [place], [place] [address]    Report Status PENDING  Incomplete  Culture, blood (Routine X 2) w Reflex to ID Panel     Status: None (Preliminary result)   Collection Time: 05/06/22  8:50 AM   Specimen: BLOOD  Result Value Ref Range Status   Specimen Description BLOOD RIGHT  ANTECUBITAL  Final   Special Requests   Final    BOTTLES DRAWN AEROBIC AND ANAEROBIC Blood Culture adequate volume   Culture   Final    NO GROWTH < 24 HOURS Performed at [facility], [place] [address] [place], [place] [address]    Report Status PENDING  Incomplete         Radiology Studies: CT ABDOMEN PELVIS W CONTRAST  Result Date: 05/06/2022 CLINICAL DATA:  Follow-up severe acute pancreatitis. EXAM: CT ABDOMEN AND PELVIS WITH CONTRAST TECHNIQUE: Multidetector CT imaging of the abdomen and pelvis was performed using the standard protocol following bolus administration of intravenous contrast. RADIATION DOSE REDUCTION: This exam was performed according to the departmental dose-optimization program which includes automated exposure control, adjustment of the mA and/or kV according to patient size and/or use of iterative reconstruction technique. CONTRAST:  127m OMNIPAQUE IOHEXOL 300 MG/ML  SOLN COMPARISON:  05/02/2022 FINDINGS: Lower Chest: Increased moderate bilateral pleural effusions and dependent lower lobe atelectasis compared to prior study. Hepatobiliary: No hepatic masses identified. Small contracted gallbladder. No evidence of biliary ductal dilatation. Pancreas: Severe acute pancreatitis shows no significant change in appearance since previous study. No evidence of pancreatic necrosis. Retroperitoneal fluid is again seen tracking inferiorly in the anterior and posterior pararenal spaces, right side greater than left, without significant change. No well-formed pseudocysts are identified. Spleen: Within normal limits in size and appearance. Adrenals/Urinary Tract: No masses identified. No evidence of ureteral calculi or hydronephrosis. Stomach/Bowel: Large hiatal hernia is again seen feeding tube is seen with tip in the gastric antrum. Mild reactive wall thickening is again seen involving small bowel loops in the central abdomen. Vascular/Lymphatic: No pathologically enlarged lymph  nodes. No acute vascular findings. Aortic atherosclerotic calcification incidentally noted. Reproductive:  No mass or other significant abnormality. Other:  None. Musculoskeletal: No suspicious bone lesions identified. Old compression fractures the T12, L1, L2 vertebral bodies unchanged. IMPRESSION: No significant change in severe acute pancreatitis, with retroperitoneal fluid tracking inferiorly within the right anterior and posterior pararenal spaces. Increased moderate bilateral pleural effusions and dependent lower lobe atelectasis. Stable large hiatal hernia. Aortic Atherosclerosis (ICD10-I70.0). Electronically Signed   By: JMarlaine HindM.D.   On: 05/06/2022 15:13   DG CHEST PORT 1 VIEW  Result Date: 05/06/2022 CLINICAL DATA:  Acute pancreatitis.  Coronary artery disease. EXAM: PORTABLE CHEST 1 VIEW COMPARISON:  05/04/2022 FINDINGS: Heart size is stable. Prior CABG again noted. Feeding tube is seen entering the stomach. The distal tip is below the field of view on this study. Hiatal hernia again noted. Low lung volumes are again seen. Atelectasis or infiltrate is seen in the retrocardiac left lower lobe, without significant change. Right lung is grossly clear. IMPRESSION: Stable low lung volumes and left lower lobe atelectasis versus infiltrate. Hiatal hernia. Electronically Signed   By: JMarlaine HindM.D.   On: 05/06/2022 08:49        Scheduled Meds:  carbidopa-levodopa  1 tablet Oral TID   clopidogrel  75 mg Oral Daily   enoxaparin (LOVENOX) injection  40 mg Subcutaneous [id number]  folic acid  1 mg Oral Daily   furosemide  40 mg Intravenous Once   mouth rinse  15 mL Mouth Rinse BID   metoprolol tartrate  25 mg Oral BID   multivitamin with minerals  1 tablet Per Tube Daily   pantoprazole (PROTONIX) IV  40 mg Intravenous Q24H   potassium chloride  40 mEq Oral Q4H   [START ON 05/13/2022] thiamine injection  100 mg Intravenous Daily   Continuous Infusions:  dextrose 5 % with KCl 20 mEq / L      feeding supplement (VITAL 1.5 CAL) 1,000 mL (05/07/22 0600)   piperacillin-tazobactam (ZOSYN)  IV 3.375 g (05/07/22 0603)   thiamine injection 500 mg (05/07/22 8182)   Followed by   Derrill Memo ON 05/08/2022] thiamine injection       LOS: 6 days    Time spent: over 30 min    Fayrene Helper, MD [facility]   To contact the attending provider between 7A-7P or the covering provider during after hours 7P-7A, please log into the web site [email or web address] and access using universal [place] password for that web site. If you do not have the password, please call the hospital operator.  05/07/2022, 9:50 AM

## 2022-05-08 ENCOUNTER — Inpatient Hospital Stay (HOSPITAL_COMMUNITY): Payer: Medicare PPO

## 2022-05-08 DIAGNOSIS — K859 Acute pancreatitis without necrosis or infection, unspecified: Secondary | ICD-10-CM | POA: Diagnosis not present

## 2022-05-08 DIAGNOSIS — Z7189 Other specified counseling: Secondary | ICD-10-CM

## 2022-05-08 HISTORY — PX: IR THORACENTESIS ASP PLEURAL SPACE W/IMG GUIDE: IMG5380

## 2022-05-08 LAB — CBC WITH DIFFERENTIAL/PLATELET
Abs Immature Granulocytes: 0.09 10*3/uL — ABNORMAL HIGH (ref 0.00–0.07)
Basophils Absolute: 0 10*3/uL (ref 0.0–0.1)
Basophils Relative: 0 %
Eosinophils Absolute: 0.1 10*3/uL (ref 0.0–0.5)
Eosinophils Relative: 1 %
HCT: 33.4 % — ABNORMAL LOW (ref 39.0–52.0)
Hemoglobin: 10.8 g/dL — ABNORMAL LOW (ref 13.0–17.0)
Immature Granulocytes: 1 %
Lymphocytes Relative: 7 %
Lymphs Abs: 0.9 10*3/uL (ref 0.7–4.0)
MCH: 28.6 pg (ref 26.0–34.0)
MCHC: 32.3 g/dL (ref 30.0–36.0)
MCV: 88.4 fL (ref 80.0–100.0)
Monocytes Absolute: 1.1 10*3/uL — ABNORMAL HIGH (ref 0.1–1.0)
Monocytes Relative: 8 %
Neutro Abs: 11.1 10*3/uL — ABNORMAL HIGH (ref 1.7–7.7)
Neutrophils Relative %: 83 %
Platelets: 236 10*3/uL (ref 150–400)
RBC: 3.78 MIL/uL — ABNORMAL LOW (ref 4.22–5.81)
RDW: 15.7 % — ABNORMAL HIGH (ref 11.5–15.5)
WBC: 13.2 10*3/uL — ABNORMAL HIGH (ref 4.0–10.5)
nRBC: 0 % (ref 0.0–0.2)

## 2022-05-08 LAB — BASIC METABOLIC PANEL
Anion gap: 11 (ref 5–15)
Anion gap: 8 (ref 5–15)
BUN: 27 mg/dL — ABNORMAL HIGH (ref 8–23)
BUN: 29 mg/dL — ABNORMAL HIGH (ref 8–23)
CO2: 25 mmol/L (ref 22–32)
CO2: 25 mmol/L (ref 22–32)
Calcium: 8.3 mg/dL — ABNORMAL LOW (ref 8.9–10.3)
Calcium: 8.2 mg/dL — ABNORMAL LOW (ref 8.9–10.3)
Chloride: 114 mmol/L — ABNORMAL HIGH (ref 98–111)
Chloride: 115 mmol/L — ABNORMAL HIGH (ref 98–111)
Creatinine, Ser: 1.14 mg/dL (ref 0.61–1.24)
Creatinine, Ser: 1.26 mg/dL — ABNORMAL HIGH (ref 0.61–1.24)
GFR, Estimated: >60 mL/min (ref >60)
GFR, Estimated: 59 mL/min — ABNORMAL LOW (ref >60)
Glucose, Bld: 144 mg/dL — ABNORMAL HIGH (ref 70–99)
Glucose, Bld: 188 mg/dL — ABNORMAL HIGH (ref 70–99)
Potassium: 4.9 mmol/L (ref 3.5–5.1)
Potassium: 4.6 mmol/L (ref 3.5–5.1)
Sodium: 150 mmol/L — ABNORMAL HIGH (ref 135–145)
Sodium: 148 mmol/L — ABNORMAL HIGH (ref 135–145)

## 2022-05-08 LAB — BODY FLUID CELL COUNT WITH DIFFERENTIAL
Eos, Fluid: 0 %
Lymphs, Fluid: 0 %
Monocyte-Macrophage-Serous Fluid: 7 % — ABNORMAL LOW (ref 50–90)
Neutrophil Count, Fluid: 93 % — ABNORMAL HIGH (ref 0–25)
Total Nucleated Cell Count, Fluid: 2970 cu mm — ABNORMAL HIGH (ref 0–1000)

## 2022-05-08 LAB — PROTEIN, PLEURAL OR PERITONEAL FLUID: Total protein, fluid: <3 g/dL

## 2022-05-08 LAB — COMPREHENSIVE METABOLIC PANEL
ALT: 21 U/L (ref 0–44)
AST: 31 U/L (ref 15–41)
Albumin: 1.7 g/dL — ABNORMAL LOW (ref 3.5–5.0)
Alkaline Phosphatase: 49 U/L (ref 38–126)
Anion gap: 5 (ref 5–15)
BUN: 27 mg/dL — ABNORMAL HIGH (ref 8–23)
CO2: 26 mmol/L (ref 22–32)
Calcium: 8 mg/dL — ABNORMAL LOW (ref 8.9–10.3)
Chloride: 117 mmol/L — ABNORMAL HIGH (ref 98–111)
Creatinine, Ser: 1.05 mg/dL (ref 0.61–1.24)
GFR, Estimated: >60 mL/min (ref >60)
Glucose, Bld: 194 mg/dL — ABNORMAL HIGH (ref 70–99)
Potassium: 4.1 mmol/L (ref 3.5–5.1)
Sodium: 148 mmol/L — ABNORMAL HIGH (ref 135–145)
Total Bilirubin: 0.4 mg/dL (ref 0.3–1.2)
Total Protein: 5.4 g/dL — ABNORMAL LOW (ref 6.5–8.1)

## 2022-05-08 LAB — GLUCOSE, CAPILLARY
Glucose-Capillary: 137 mg/dL — ABNORMAL HIGH (ref 70–99)
Glucose-Capillary: 138 mg/dL — ABNORMAL HIGH (ref 70–99)
Glucose-Capillary: 152 mg/dL — ABNORMAL HIGH (ref 70–99)
Glucose-Capillary: 156 mg/dL — ABNORMAL HIGH (ref 70–99)
Glucose-Capillary: 157 mg/dL — ABNORMAL HIGH (ref 70–99)
Glucose-Capillary: 174 mg/dL — ABNORMAL HIGH (ref 70–99)
Glucose-Capillary: 184 mg/dL — ABNORMAL HIGH (ref 70–99)

## 2022-05-08 LAB — PHOSPHORUS: Phosphorus: 2.2 mg/dL — ABNORMAL LOW (ref 2.5–4.6)

## 2022-05-08 LAB — PROCALCITONIN: Procalcitonin: 1.53 ng/mL

## 2022-05-08 LAB — C-REACTIVE PROTEIN: CRP: 24.3 mg/dL — ABNORMAL HIGH (ref <1.0)

## 2022-05-08 LAB — SEDIMENTATION RATE: Sed Rate: 97 mm/hr — ABNORMAL HIGH (ref 0–16)

## 2022-05-08 LAB — GLUCOSE, PLEURAL OR PERITONEAL FLUID: Glucose, Fluid: 75 mg/dL

## 2022-05-08 LAB — BRAIN NATRIURETIC PEPTIDE: B Natriuretic Peptide: 119.2 pg/mL — ABNORMAL HIGH (ref 0.0–100.0)

## 2022-05-08 LAB — MAGNESIUM: Magnesium: 2.2 mg/dL (ref 1.7–2.4)

## 2022-05-08 LAB — LACTATE DEHYDROGENASE, PLEURAL OR PERITONEAL FLUID: LD, Fluid: 1596 U/L — ABNORMAL HIGH (ref 3–23)

## 2022-05-08 LAB — LACTATE DEHYDROGENASE: LDH: 371 U/L — ABNORMAL HIGH (ref 98–192)

## 2022-05-08 MED ORDER — POTASSIUM CL IN DEXTROSE 5% 20 MEQ/L IV SOLN
20.0000 meq | INTRAVENOUS | Status: DC
  Administered 2022-05-08: 20 meq via INTRAVENOUS
  Filled 2022-05-08: qty 1000

## 2022-05-08 MED ORDER — HYDROMORPHONE HCL 1 MG/ML IJ SOLN: 0.5000 mg | INTRAMUSCULAR | Status: DC | PRN

## 2022-05-08 MED ORDER — DEXTROSE 5 % IV SOLN: INTRAVENOUS | Status: DC

## 2022-05-08 MED ORDER — POTASSIUM CL IN DEXTROSE 5% 20 MEQ/L IV SOLN
20.0000 meq | INTRAVENOUS | Status: DC
  Filled 2022-05-08: qty 1000

## 2022-05-08 MED ORDER — SODIUM CHLORIDE 0.9 % IV SOLN
1.0000 g | Freq: Two times a day (BID) | INTRAVENOUS | Status: DC
  Administered 2022-05-08 – 2022-05-09 (×2): 1 g via INTRAVENOUS
  Filled 2022-05-08 (×3): qty 20

## 2022-05-08 MED ORDER — LIDOCAINE HCL 1 % IJ SOLN
INTRAMUSCULAR | Status: AC
  Filled 2022-05-08: qty 20

## 2022-05-08 MED ORDER — METOPROLOL TARTRATE 5 MG/5ML IV SOLN: 2.5000 mg | Freq: Four times a day (QID) | INTRAVENOUS | Status: DC | PRN

## 2022-05-08 MED ORDER — ALBUMIN HUMAN 25 % IV SOLN
25.0000 g | Freq: Four times a day (QID) | INTRAVENOUS | Status: AC
  Administered 2022-05-08 – 2022-05-09 (×3): 25 g via INTRAVENOUS
  Filled 2022-05-08 (×4): qty 100

## 2022-05-08 MED ORDER — LIDOCAINE HCL (PF) 1 % IJ SOLN
INTRAMUSCULAR | Status: DC | PRN
  Administered 2022-05-08: 10 mL

## 2022-05-08 MED ORDER — FUROSEMIDE 10 MG/ML IJ SOLN
40.0000 mg | Freq: Once | INTRAMUSCULAR | Status: AC
  Administered 2022-05-08: 40 mg via INTRAVENOUS
  Filled 2022-05-08: qty 4

## 2022-05-08 NOTE — Procedures (Signed)
PROCEDURE SUMMARY:  Successful US guided right thoracentesis. Yielded 200 ml of frothy yellow fluid. Pt tolerated procedure well. No immediate complications.  Specimen sent for labs. CXR ordered; no post-procedure pneumothorax identified.   EBL < 2 mL  Theresa Duty, NP 05/08/2022 12:51 PM

## 2022-05-08 NOTE — Consult Note (Signed)
NAME:  Russell Hebert, MRN:  [id number], DOB:  [birth year], LOS: 7 ADMISSION DATE:  04/24/2022, CONSULTATION DATE:  6/6 REFERRING MD:  Dr. Florene Glen, CHIEF COMPLAINT:  Empyema   History of Present Illness:  76 year old male with PMH as below, which is significant for CAD, CABG, hypercholesterolemia, HTN, ICH, and TIA. He was admitted to [facility] 5/30 with complaints of back pain radiating to the abdomen. Laboratory evaluation concerning for acute pancreatitis. He was admitted to the hospitalists service and treated with bowel rest and IVF resuscitaiton. Suspected to be related to gallstones considering prior ultrasound, however, no clear etiology discovered this admission. Course then became complicated by respiratory failure with hypoxia as well as SIRS/sepsis. Workup included imaging of the chest significant for bilateral pleural effusions. Antibiotics were broadened. He underwent thoracentesis 6/6, and subsequently had a decline in his respiratory function as well as mental status. Goals of care were discussed and he was clear about DNR status. No intubation. Repeat CXR without clear etiology. Thoracentesis yielded 200cc of frothy yellow fluid. Initial pleural fluid studies with neutrophil predominance. Preliminary cultures growing GNR. PCCM consulted for empyema.    Pertinent  Medical History   has a past medical history of Carotid artery disease ([place]), Coronary artery disease, Ejection fraction, GERD (gastroesophageal reflux disease), CABG, Hypercholesterolemia, Hypertension, Intracerebral bleed ([place]), and TIA (transient ischemic attack) (2006).   Significant Hospital Events: Including procedures, antibiotic start and stop dates in addition to other pertinent events   5/30 admit for pancreatitis 6/6 thoracentesis on the R. Bilateral effusions. Labs looking like empyema. Clinically worse after thora. PCCM consulted.   Interim History / Subjective:    Objective   Blood pressure 117/75, pulse  (!) 125, temperature (!) 101.3 F (38.5 C), temperature source Axillary, resp. rate (!) 27, height '5\' 7"'$  (1.702 m), weight 72 kg, SpO2 93 %.    FiO2 (%):  [40 %] 40 %   Intake/Output Summary (Last 24 hours) at 05/08/2022 1921 Last data filed at 05/08/2022 1811 Gross per 24 hour  Intake 5440.43 ml  Output 2350 ml  Net 3090.43 ml   Filed Weights   05/06/22 0500 05/07/22 0500 05/08/22 0500  Weight: 71.4 kg 72.6 kg 72 kg    Examination: General: Frail elderly male in no distress.  HENT: [place]/AT, PERRL, No JVD Lungs: Coarse throughout. Diminished bases.  Cardiovascular: Tachy, regular, no MRG Abdomen: Soft, non-tender, non-distended Extremities: No acute deformity Neuro: Somnolent, arouses to touch, does not offer meaningful responses.   Resolved Hospital Problem list     Assessment & Plan:   Empyema on the R: s/p thoracentesis 6/6 with 200cc removed. Clinically worse after thoracentesis, but no complication on post-procedure film. Bedside ultrasound on my assessment with no fluid laterally superior to the diaphragm, just densely consolidated lung.  - no role for chest tube - continue antibiotics as you are - await cultures to finalize.  - PCCM will follow  Acute hypoxemic respiratory failure: worse today. Etiology unclear. In theory if due to effusion should have improved after thora, but worsened. No PTX or other procedural complication identified. ? Aspiration. Known dysphagia requiring Dys 1 diet and nectar liquids.  - Supplemental oxygen to keep says > 92% - heated high flow may be advantageous over venturi mask - Antibiotics as you are - Aspiration precautions - Based on hospitalist notes the patient would not want intubation  Acute pancreatitis: GI following. Symptoms and labs largely improved.  - Continue supportive care  Protein calorie malnutrition -  RD and SLP following.   Best Practice (right click and "Reselect all SmartList Selections" daily)   Per  primary  Labs   CBC: Recent Labs  Lab 05/04/22 0412 05/05/22 0123 05/06/22 0109 05/07/22 0011 05/08/22 0556  WBC 5.6 5.2 6.0 10.1 13.2*  NEUTROABS 5.1 4.2 4.2 8.2* 11.1*  HGB 11.2* 11.8* 11.3* 10.9* 10.8*  HCT 35.2* 36.4* 34.2* 33.9* 33.4*  MCV 90.3 88.3 86.6 88.1 88.4  PLT 249 232 206 185 681    Basic Metabolic Panel: Recent Labs  Lab 05/04/22 0412 05/04/22 1630 05/05/22 0123 05/05/22 1554 05/06/22 0109 05/06/22 1416 05/07/22 0011 05/07/22 1659 05/08/22 0556 05/08/22 1253 05/08/22 1815  NA 144   < > 146*   < > 149*   < > 145 147* 148* 150* 148*  K 4.2   < > 3.5   < > 3.2*   < > 3.3* 4.2 4.1 4.9 4.6  CL 118*   < > 116*   < > 115*   < > 113* 114* 117* 114* 115*  CO2 19*   < > 23   < > 26   < > '26 25 26 25 25  '$ GLUCOSE 64*   < > 103*   < > 142*   < > 163* 180* 194* 144* 188*  BUN 38*   < > 32*   < > 27*   < > 23 26* 27* 27* 29*  CREATININE 1.46*   < > 1.20   < > 1.15   < > 1.08 1.11 1.05 1.14 1.26*  CALCIUM 7.9*   < > 8.0*   < > 7.6*   < > 7.5* 8.0* 8.0* 8.3* 8.2*  MG 2.2  --  2.4  --  2.1  --  2.0  --  2.2  --   --   PHOS 2.8  --  2.1*  --  1.7*  --  2.6  --  2.2*  --   --    < > = values in this interval not displayed.   GFR: Estimated Creatinine Clearance: 45.9 mL/min (A) (by C-G formula based on SCr of 1.26 mg/dL (H)). Recent Labs  Lab 05/05/22 0123 05/06/22 0109 05/06/22 0605 05/06/22 0849 05/06/22 1416 05/07/22 0011 05/07/22 1659 05/08/22 0556  PROCALCITON  --   --   --   --   --   --  2.08 1.53  WBC 5.2 6.0  --   --   --  10.1  --  13.2*  LATICACIDVEN  --   --  1.9 2.1* 1.7  --   --   --     Liver Function Tests: Recent Labs  Lab 05/04/22 1630 05/05/22 0123 05/06/22 0109 05/07/22 0011 05/08/22 0556  AST 42* 41 34 37 31  ALT '9 10 19 16 21  '$ ALKPHOS 49 50 43 43 49  BILITOT 1.1 0.8 0.8 0.6 0.4  PROT 5.2* 5.5* 5.2* 4.9* 5.4*  ALBUMIN 2.2* 2.3* 1.8* 1.7* 1.7*   Recent Labs  Lab 05/04/22 0412  LIPASE 112*   No results for input(s):  AMMONIA in the last 168 hours.  ABG    Component Value Date/Time   HCO3 20.7 10/02/2007 1034   TCO2 17 (L) 04/22/2022 1416     Coagulation Profile: No results for input(s): INR, PROTIME in the last 168 hours.  Cardiac Enzymes: No results for input(s): CKTOTAL, CKMB, CKMBINDEX, TROPONINI in the last 168 hours.  HbA1C: Hgb A1c MFr Bld  Date/Time Value Ref  Range Status  05/05/2022 01:23 AM 5.5 4.8 - 5.6 % Final    Comment:    (NOTE) Pre diabetes:          5.7%-6.4%  Diabetes:              >6.4%  Glycemic control for   <7.0% adults with diabetes     CBG: Recent Labs  Lab 05/08/22 0347 05/08/22 0802 05/08/22 1246 05/08/22 1746 05/08/22 1911  GLUCAP 174* 157* 137* 184* 138*    Review of Systems:   Patient is encephalopathic and/or intubated. Therefore history has been obtained from chart review.    Past Medical History:  He,  has a past medical history of Carotid artery disease ([place]), Coronary artery disease, Ejection fraction, GERD (gastroesophageal reflux disease), CABG, Hypercholesterolemia, Hypertension, Intracerebral bleed ([place]), and TIA (transient ischemic attack) (2006).   Surgical History:   Past Surgical History:  Procedure Laterality Date   CORONARY ARTERY BYPASS GRAFT  2000   Acute lateral MI and bare-metal stent circumflex..(10/08) Cath 10/14/08; LIMA to the LAD patent ; all vein grafts patent; OM stent patent..some sluggish distal flow.   IR THORACENTESIS ASP PLEURAL SPACE W/IMG GUIDE  05/08/2022     Social History:   reports that he has never smoked. He has never used smokeless tobacco. He reports current alcohol use. He reports that he does not use drugs.   Family History:  His family history includes Heart disease in his mother; Heart failure in his father and mother.   Allergies Allergies  Allergen Reactions   Morphine Anaphylaxis   Celebrex [Celecoxib] Diarrhea     Home Medications  Prior to Admission medications   Medication Sig Start  Date End Date Taking? Authorizing Provider  acetaminophen (TYLENOL) 500 MG tablet Take 1,000 mg by mouth every 6 (six) hours as needed for headache (pain).   Yes [provider]  atorvastatin (LIPITOR) 40 MG tablet Take 40 mg by mouth every morning. 02/19/22  Yes [provider]  carbidopa-levodopa (SINEMET IR) 25-100 MG tablet TAKE 1 TABLET THREE TIMES DAILY AT 7AM, 11AM AND 4PM Patient taking differently: Take 1 tablet by mouth every morning. 10/30/21  Yes Tat, Eustace Quail, DO  citalopram (CELEXA) 10 MG tablet Take 10 mg by mouth every morning.   Yes [provider]  clopidogrel (PLAVIX) 75 MG tablet Take 75 mg by mouth every morning.   Yes [provider]  diazepam (VALIUM) 5 MG tablet Take 5 mg by mouth daily as needed (dizziness).   Yes [provider]  omeprazole (PRILOSEC) 20 MG capsule Take 20 mg by mouth every morning.   Yes [provider]  traMADol (ULTRAM) 50 MG tablet Take 50 mg by mouth every 6 (six) hours as needed (pain).   Yes [provider]  carbidopa-levodopa (SINEMET CR) 50-200 MG tablet Take 1 tablet by mouth at bedtime. Patient not taking: Reported on 04/12/2022 11/14/21   Tat, Eustace Quail, DO  predniSONE (DELTASONE) 20 MG tablet 2 tablet Patient not taking: Reported on 05/02/2022 10/29/21   [provider]     Critical care time: N/a     Georgann Housekeeper, AGACNP-BC [place] for personal pager PCCM on call pager [phone] until 7pm. Please call Elink 7p-7a. [phone]  05/08/2022 9:40 PM

## 2022-05-08 NOTE — TOC Progression Note (Signed)
Transition of Care [facility]) - Progression Note    Patient Details  Name: Russell Hebert MRN: [id number] Date of Birth: 1946-08-13  [facility] CM/SW Contact  Reece Agar, [place] Phone Number: 05/08/2022, 11:27 AM  Clinical Narrative:    CSW spoke to pt daughter about SNF offers, she will review offers and contact CSW with a decision.    Expected Discharge Plan: [place] Barriers to Discharge: Continued Medical Work up, SNF Pending bed offer  Expected Discharge Plan and Services Expected Discharge Plan: [place] In-house Referral: Clinical Social Work   Post Acute Care Choice: [place] Living arrangements for the past 2 months: Single Family Home                                       Social Determinants of Health (SDOH) Interventions    Readmission Risk Interventions     View : No data to display.

## 2022-05-08 NOTE — Progress Notes (Addendum)
**Note De-Identified vi Obfusction** PROGRESS NOTE    Russell Hebert  EQA:[id number] DOB: 1945-12-17 DOA: 04/07/2022 PCP: Russell Hebert, L.Mrlou S, MD  Chief Complint  Ptient presents with   Bck Pin    Brief Nrrtive:  Russell Hebert is  76 y.o. mle with medicl history significnt of prkinsonin syndrome, GERD, hypertension, CAD s/p CABG, history of fll in December resulting in lumbr spine frcture, presented to ED with  complint of bck pin.  Lived independently nd wlked with wlker prior to dmission.  Drnk 3-4 beers 2-3x  week, but lst beer 4 dys piror to dmission.    CT with findings concerning for pncretitis.  Hospitl course complicted by delirium nd uncontrolled pin.    GI consulted 6/2.  He's hd recurrent fevers.  Pin is improved t this point.  He hs cortrk for nutrition.  Repet imging notble for severe pncretitis nd bilterl effusions.  Currently on ntibiotics for concern for pneumoni.  IR for thorcentesis 6/6.  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  Etoh pncretitis lso on ddx (though he reported to dmitting provider not drinking for severl dys prior to dmission - will clrify when he's better ble).  Norml triglycerides.  No obvious meds tht stnd out s cuse. Holding further isotonic IVF with concern for his volume nd resp sttus (ok for hypotonic fluids with hyperntremi) t this point with concerns for volume/effusions, diuresing s tolerted Abdominl exm  bit better tody, still distended, but less pin on exm Repet CT scn s bove, severe cute pncretitis cortrk plced 6/2 - tube feeds per RD, seems to be tolerting this well.  Dysphgi 1 diet, nectr thick liquids s tolerted.  Pin mngement, bowel regimen Apprecite GI recommendtions - will discuss with generl surgery once he's more stble for lp chole eventully, he's not stble for this now (see GI note from 6/5, now signed off).     Gols of cre, counseling/discussion Addendum 4:09 PM  Looks worse resp sttus wise fter thorcentesis Repet CXR, initil without pneumothorx Incresed wob, grunting, lots of upper irwy sounds Discussed with dughter regrding his decline.  He'd be pproprite for intubtion, mechnicl ventiltion with this decline in resp sttus/AMS, but we discussed his DNR would indicte tht ws not something he'd wnt.  Dr. Dereth Hebert hd discussed on dmission with ptient nd he ws reportedly cler with regrds to his DNR.  His dughter is wre tht he hs  DNR.  Discussed we'd follow his CXR, follow these results nd discuss further fterwrds.  Will give opites for **Note De-Identified vi Obfusction** ir hunger right now to help with his comfort.  I'm concerned bout his overll prognosis nd potentil for survivl.  She sked if her brother should come in from out of town nd I recommended he do tht.  Discussed hving pllitive cre involved for further gols of cre discussion in cse of need for trnsition to comfort mesures, she's greeble.    Given dditionl info with regrds to his empyem, mybe worth seeing how he does with dditionl tx given our dditionl info.  Sepsis ([place]) Fever, tchycrdi, tchypne.  Leukocytosis developing tody. Relted to pneumoni vs pncretitis  Blood cultures NGTD, repet (prior from 6/2 NGTD) UA negtive nitrite, LE CT chest (ordered 6/5, not done until tody when ordered stt) tody with moderte effusions with telectsis in the lower lobes, upper bdominl inflmmtion corresponding to known cute pncretitis CT bd/pelvis without sig chnge in severe cute pncretitis, retroperitonel fluid trcking inferiorly within R nterior nd posterior prrenl spces, incresed moderte bilterl effusions nd dependent lower lobe telectsis covid negtive Negtive urine strep, negtive MRSA PCR unsyn 6/2 - 6/3 bx brodened to zosyn (6/4-present).  Still with persistent fevers.  Plnning for thorcentesis.  7:01 PM ddendum thor with grm negtive rods, he's been brodened to meropenem, repet CXR low volume with probbly tiny L effusion.  Discussed with pulm, they'll hve someone see him tonight.   Acute respirtory filure with hypoxi (HCC) Repet CXR 6/4 s bove [place]den bx to zosyn for fever Follow blood cultures NG (repet pending), sputum cx if ble, urine strep negtive, MRSA PCR negtive CT with moderte effusions with telectsis in the lower lobes Continue lsix dily s tolerted with effusions, elevted BNP --- ddendum, hold further lsix, tril lbumin given hypolbuminemi Continue I/O, dily weights Will consult IR for thorcentesis with his resp distress to see if this helps llevite  AKI (cute kidney injury) ([place]) CT without hydro Given imging, likely contrst induced nephropthy, hemodynmiclly medited with pncretitis Getting hypotonic fluids with his hyperntremi Lsix dily s tolerted - ddendum hold further t this time, try some lbumin with  hypolbuminemi Strict I/O, dily weights   Acute metbolic encephlopthy Suspect this is relted to opites, cute hospitliztion, pin, cute kidney injury etc He's moving ll extremities without ny pprecited focl deficits - I think everything (slowed/delyed responses/speech/hllucintions) t this time cn be ttributed to opites nd hospitl delirium.  Out of window for etoh withdrwl now. Continued encephlopthy, wxing nd wning Follow with opites  Delirium precutions High dose thimine Workup dditionlly if persistent or worsening  Sinus tchycrdi EKG with sinus tch, possible q in II, VF pper chronic Seems likely relted to pin from pncretitis, respirtory sttus Now being diuresed s noted bove Echo poor qulity, norml EF Frequent PVC's, will strt low dose metop - sinus tch, suspect cuse pncretitis/resp sttus, but given persistence will give  little bet blocker but will continue to tret underlying cuse    Hyperntremi Continue hypotonic fluids  Metbolic cidosis resolved  Alcohol buse Per dmission H&P drinks 3-4 beers 2-3x/week, lst drink 4 dys prior to presenttion Hx to GI is tht he "drinks hevy lcohol on regulr bsis"  Thimine, folte, mvi   Hypotension Relted to opites improved  Elevted LFTs Elevted lk phos nd st, mild Improved, follow  Hyperklemi S/p lokelm Follow, now hypo  Hypoglycemi Due to poor PO intke D5 fluids, follow  Cortrk, tube feeds strted A1c 5.5  Ascending ortic neurysm (HCC) 4.2 cm, needs nnul follow up imging  Dysphgi SLP evl, dysphgi 1/nectr thickened  liquids  Hiatal hernia Esophageal air fluid level suggests dysmotility or GERD PPI daily  Parkinsonism (HCC) sinemet  Protein-calorie malnutrition, severe (HCC) RD, tube feeds  History of recent steroid use Per med rec, he's no longer taking this, will d/c       DVT prophylaxis: lovenox Code  Status: DNR Family Communication: daughter 6/6 Disposition:   Status is: Inpatient Remains inpatient appropriate because: additional w/u needed   Consultants:  none  Procedures:  none  Antimicrobials:  Anti-infectives (From admission, onward)    Start     Dose/Rate Route Frequency Ordered Stop   05/08/22 2200  meropenem (MERREM) 1 g in sodium chloride 0.9 % 100 mL IVPB        1 g 200 mL/hr over 30 Minutes Intravenous Every 12 hours 05/08/22 1815     05/06/22 0915  piperacillin-tazobactam (ZOSYN) IVPB 3.375 g  Status:  Discontinued        3.375 g 12.5 mL/hr over 240 Minutes Intravenous Every 8 hours 05/06/22 0818 05/08/22 1813   05/04/22 0830  Ampicillin-Sulbactam (UNASYN) 3 g in sodium chloride 0.9 % 100 mL IVPB  Status:  Discontinued        3 g 200 mL/hr over 30 Minutes Intravenous Every 6 hours 05/04/22 0740 05/06/22 0807       Subjective: Remains confused  Objective: Vitals:   05/08/22 1605 05/08/22 1720 05/08/22 1818 05/08/22 1900  BP:    117/75  Pulse: (!) 107 (!) 106 (!) 125 (!) 125  Resp: (!) 26 (!) 30 (!) 34 (!) 27  Temp:    (!) 101.3 F (38.5 C)  TempSrc:    Axillary  SpO2: 94% 94% 93% 93%  Weight:      Height:        Intake/Output Summary (Last 24 hours) at 05/08/2022 1931 Last data filed at 05/08/2022 1811 Gross per 24 hour  Intake 2429.39 ml  Output 1950 ml  Net 479.39 ml   Filed Weights   05/06/22 0500 05/07/22 0500 05/08/22 0500  Weight: 71.4 kg 72.6 kg 72 kg    Examination:  General: No acute distress. Cardiovascular: RRR Lungs: tachypneic, increased wob Abdomen: distended, no notable TTP with palpation Neurological: remains confused, answering some questions. Moves all extremities 4. Cranial nerves II through XII grossly intact. Extremities: No clubbing or cyanosis. No edema.  Data Reviewed: I have personally reviewed following labs and imaging studies  CBC: Recent Labs  Lab 05/04/22 0412 05/05/22 0123 05/06/22 0109  05/07/22 0011 05/08/22 0556  WBC 5.6 5.2 6.0 10.1 13.2*  NEUTROABS 5.1 4.2 4.2 8.2* 11.1*  HGB 11.2* 11.8* 11.3* 10.9* 10.8*  HCT 35.2* 36.4* 34.2* 33.9* 33.4*  MCV 90.3 88.3 86.6 88.1 88.4  PLT 249 232 206 185 831    Basic Metabolic Panel: Recent Labs  Lab 05/04/22 0412 05/04/22 1630 05/05/22 0123 05/05/22 1554 05/06/22 0109 05/06/22 1416 05/07/22 0011 05/07/22 1659 05/08/22 0556 05/08/22 1253 05/08/22 1815  NA 144   < > 146*   < > 149*   < > 145 147* 148* 150* 148*  K 4.2   < > 3.5   < > 3.2*   < > 3.3* 4.2 4.1 4.9 4.6  CL 118*   < > 116*   < > 115*   < > 113* 114* 117* 114* 115*  CO2 19*   < > 23   < > 26   < > _0 GLUCOSE 64*   < > 103*   < >  142*   < > 163* 180* 194* 144* 188*  BUN 38*   < > 32*   < > 27*   < > 23 26* 27* 27* 29*  CREATININE 1.46*   < > 1.20   < > 1.15   < > 1.08 1.11 1.05 1.14 1.26*  CALCIUM 7.9*   < > 8.0*   < > 7.6*   < > 7.5* 8.0* 8.0* 8.3* 8.2*  MG 2.2  --  2.4  --  2.1  --  2.0  --  2.2  --   --   PHOS 2.8  --  2.1*  --  1.7*  --  2.6  --  2.2*  --   --    < > = values in this interval not displayed.    GFR: Estimated Creatinine Clearance: 45.9 mL/min (Cerys Winget) (by C-G formula based on SCr of 1.26 mg/dL (H)).  Liver Function Tests: Recent Labs  Lab 05/04/22 1630 05/05/22 0123 05/06/22 0109 05/07/22 0011 05/08/22 0556  AST 42* 41 34 37 31  ALT _0 ALKPHOS 49 50 43 43 49  BILITOT 1.1 0.8 0.8 0.6 0.4  PROT 5.2* 5.5* 5.2* 4.9* 5.4*  ALBUMIN 2.2* 2.3* 1.8* 1.7* 1.7*    CBG: Recent Labs  Lab 05/08/22 0347 05/08/22 0802 05/08/22 1246 05/08/22 1746 05/08/22 1911  GLUCAP 174* 157* 137* 184* 138*     Recent Results (from the past 240 hour(s))  Urine Culture     Status: None   Collection Time: 05/02/22 12:41 PM   Specimen: Urine, Clean Catch  Result Value Ref Range Status   Specimen Description URINE, CLEAN CATCH  Final   Special Requests NONE  Final   Culture   Final    NO GROWTH Performed at [facility], [place] [address] [place], [place] [address]    Report Status 05/03/2022 FINAL  Final  MRSA Next Gen by PCR, Nasal     Status: None   Collection Time: 05/04/22  7:28 AM   Specimen: Nasal Mucosa; Nasal Swab  Result Value Ref Range Status   MRSA by PCR Next Gen NOT DETECTED NOT DETECTED Final    Comment: (NOTE) The GeneXpert MRSA Assay (FDA approved for NASAL specimens only), is one component of Pati Thinnes comprehensive MRSA colonization surveillance program. It is not intended to diagnose MRSA infection nor to guide or monitor treatment for MRSA infections. Test performance is not FDA approved in patients less than 40 years old. Performed at [facility], [place] [address] [place], [place] [address]   Culture, blood (Routine X 2) w Reflex to ID Panel     Status: None (Preliminary result)   Collection Time: 05/04/22  8:39 AM   Specimen: BLOOD RIGHT FOREARM  Result Value Ref Range Status   Specimen Description BLOOD RIGHT FOREARM  Final   Special Requests   Final    BOTTLES DRAWN AEROBIC AND ANAEROBIC Blood Culture adequate volume   Culture   Final    NO GROWTH 4 DAYS Performed at [facility], [place] [address] [place], [place] [address]    Report Status PENDING  Incomplete  Culture, blood (Routine X 2) w Reflex to ID Panel     Status: None (Preliminary result)   Collection Time: 05/04/22  8:49 AM   Specimen: BLOOD RIGHT WRIST  Result Value Ref Range Status   Specimen Description BLOOD RIGHT WRIST  Final   Special Requests  Final    BOTTLES DRAWN AEROBIC AND ANAEROBIC Blood Culture adequate volume   Culture   Final    NO GROWTH 4 DAYS Performed at [facility], [place] [address] [place], [place] [address]    Report Status PENDING  Incomplete  SARS Coronavirus 2 by RT PCR (hospital order, performed in [facility] hospital lab) *cepheid single result test* Anterior Nasal Swab     Status: None   Collection Time: 05/06/22  7:41 AM   Specimen: Anterior Nasal Swab   Result Value Ref Range Status   SARS Coronavirus 2 by RT PCR NEGATIVE NEGATIVE Final    Comment: (NOTE) SARS-CoV-2 target nucleic acids are NOT DETECTED.  The SARS-CoV-2 RNA is generally detectable in upper and lower respiratory specimens during the acute phase of infection. The lowest concentration of SARS-CoV-2 viral copies this assay can detect is 250 copies / mL. Juanjose Mojica negative result does not preclude SARS-CoV-2 infection and should not be used as the sole basis for treatment or other patient management decisions.  Gearold Wainer negative result may occur with improper specimen collection / handling, submission of specimen other than nasopharyngeal swab, presence of viral mutation(s) within the areas targeted by this assay, and inadequate number of viral copies (<250 copies / mL). Saniyah Mondesir negative result must be combined with clinical observations, patient history, and epidemiological information.  Fact Sheet for Patients:   https://www.patel.info/  Fact Sheet for Healthcare Providers: https://hall.com/  This test is not yet approved or  cleared by the [place] FDA and has been authorized for detection and/or diagnosis of SARS-CoV-2 by FDA under an Emergency Use Authorization (EUA).  This EUA will remain in effect (meaning this test can be used) for the duration of the COVID-19 declaration under Section 564(b)(1) of the Act, 21 U.S.C. section 360bbb-3(b)(1), unless the authorization is terminated or revoked sooner.  Performed at [facility], [place] [address], [place], [place] [address]   Culture, blood (Routine X 2) w Reflex to ID Panel     Status: None (Preliminary result)   Collection Time: 05/06/22  8:50 AM   Specimen: BLOOD LEFT HAND  Result Value Ref Range Status   Specimen Description BLOOD LEFT HAND  Final   Special Requests   Final    BOTTLES DRAWN AEROBIC AND ANAEROBIC Blood Culture adequate volume   Culture   Final    NO GROWTH 2  DAYS Performed at [facility], [place] [address] [place], [place] [address]    Report Status PENDING  Incomplete  Culture, blood (Routine X 2) w Reflex to ID Panel     Status: None (Preliminary result)   Collection Time: 05/06/22  8:50 AM   Specimen: BLOOD  Result Value Ref Range Status   Specimen Description BLOOD RIGHT ANTECUBITAL  Final   Special Requests   Final    BOTTLES DRAWN AEROBIC AND ANAEROBIC Blood Culture adequate volume   Culture   Final    NO GROWTH 2 DAYS Performed at [facility], [place] [address] [place], [place] [address]    Report Status PENDING  Incomplete  Body fluid culture w Gram Stain     Status: None (Preliminary result)   Collection Time: 05/08/22 12:39 PM   Specimen: Lung, Right; Pleural Fluid  Result Value Ref Range Status   Specimen Description PLEURAL  Final   Special Requests RIGHT LUNG  Final   Gram Stain   Final    ABUNDANT WBC PRESENT, PREDOMINANTLY PMN GRAM  NEGATIVE RODS CYTOSPIN SMEAR Performed at [facility], [place] [address], [place], [place] [address]    Culture PENDING  Incomplete   Report Status PENDING  Incomplete         Radiology Studies: DG Chest 1 View  Result Date: 05/08/2022 CLINICAL DATA:  Status post right thoracentesis EXAM: CHEST  1 VIEW COMPARISON:  Same day CT FINDINGS: Stable cardiomediastinal contours status post sternotomy and CABG. Enteric tube loops within patient's hiatal hernia and extends below the diaphragm. Interval decrease in right-sided pleural effusion status post thoracentesis. Persistent small left pleural effusion. Streaky bilateral perihilar and bibasilar opacities, likely atelectasis. No pneumothorax. IMPRESSION: 1. Decreased right pleural effusion status post thoracentesis. No pneumothorax. 2. Persistent small left pleural effusion. 3. Streaky bilateral perihilar and bibasilar opacities, likely atelectasis. Electronically Signed   By: Davina Poke D.O.   On: 05/08/2022 12:41   CT  CHEST WO CONTRAST  Result Date: 05/08/2022 CLINICAL DATA:  Respiratory illness, nondiagnostic xray. Shortness of breath. EXAM: CT CHEST WITHOUT CONTRAST TECHNIQUE: Multidetector CT imaging of the chest was performed following the standard protocol without IV contrast. RADIATION DOSE REDUCTION: This exam was performed according to the departmental dose-optimization program which includes automated exposure control, adjustment of the mA and/or kV according to patient size and/or use of iterative reconstruction technique. COMPARISON:  CTA chest, abdomen, and pelvis 04/12/2022. CT abdomen and pelvis 05/06/2022. FINDINGS: Cardiovascular: Aortic and coronary atherosclerosis. Unchanged aneurysmal dilatation of the ascending aorta with maximal diameter of 4.4 cm. Status post CABG. Normal heart size. No pericardial effusion. Mediastinum/Nodes: No enlarged axillary, mediastinal, or hilar lymph nodes identified within limitation of noncontrast technique. Unchanged large sliding hiatal hernia. Unremarkable thyroid. Lungs/Pleura: Moderate pleural effusions, right larger than left, are largely new from 04/17/2022 but are stable to perhaps mildly larger than on 05/06/2022. Associated compressive atelectasis in the lower lobes. Small focus of subpleural ground-glass opacity anteriorly in the right middle lobe, new from 04/19/2022 but unchanged from 05/06/2022 and likely infectious/inflammatory. Upper Abdomen: Partially visualized upper abdominal inflammation corresponding to known acute pancreatitis, more fully evaluated on the recent abdominal CT. Feeding tube terminating in the gastric body. Musculoskeletal: Partially visualized chronic thoracolumbar compression fractures. IMPRESSION: 1. Moderate pleural effusions with atelectasis in the lower lobes. 2. Partially visualized upper abdominal inflammation corresponding to known acute pancreatitis. 3. Large hiatal hernia. 4. Unchanged 4.4 cm ascending aortic aneurysm. Recommend  annual imaging followup by CTA or MRA. This recommendation follows 2010 ACCF/AHA/AATS/ACR/ASA/SCA/SCAI/SIR/STS/SVM Guidelines for the Diagnosis and Management of Patients with Thoracic Aortic Disease. Circulation. 2010; 121: G836-O294. Aortic aneurysm NOS (ICD10-I71.9) 5. Aortic Atherosclerosis (ICD10-I70.0). Electronically Signed   By: Logan Bores M.D.   On: 05/08/2022 09:05   DG CHEST PORT 1 VIEW  Result Date: 05/08/2022 CLINICAL DATA:  Shortness of breath. EXAM: PORTABLE CHEST 1 VIEW COMPARISON:  05/08/2022, earlier same day FINDINGS: 1551 hours. Low volume film. The cardio pericardial silhouette is enlarged. Minimal basilar atelectasis with probable tiny left pleural effusion. Feeding tube is again noted to be looped in the patient's large hiatal hernia. Bones are diffusely demineralized. Telemetry leads overlie the chest. IMPRESSION: 1. Low volume film with bibasilar atelectasis and probable tiny left pleural effusion. 2. Feeding tube remains looped in the patient's large hiatal hernia although distal tip is not included on the film. Electronically Signed   By: Misty Stanley M.D.   On: 05/08/2022 16:03   IR THORACENTESIS ASP PLEURAL SPACE W/IMG GUIDE  Result Date: 05/08/2022 INDICATION: Patient with bilateral pleural effusions  presents today for Kiannah Grunow diagnostic and therapeutic thoracentesis. EXAM: ULTRASOUND GUIDED THORACENTESIS MEDICATIONS: 1% lidocaine 10 mL COMPLICATIONS: None immediate. PROCEDURE: An ultrasound guided thoracentesis was thoroughly discussed with the patient and questions answered. The benefits, risks, alternatives and complications were also discussed. The patient understands and wishes to proceed with the procedure. Written consent was obtained. Ultrasound was performed to localize and mark an adequate pocket of fluid in the right chest. The area was then prepped and draped in the normal sterile fashion. 1% Lidocaine was used for local anesthesia. Under ultrasound guidance Shaylee Stanislawski 6 Fr  Safe-T-Centesis catheter was introduced. Thoracentesis was performed. The catheter was removed and Donyae Kilner dressing applied. FINDINGS: [facility] total of approximately 200 mL of frothy yellow fluid was removed. Samples were sent to the laboratory as requested by the clinical team. IMPRESSION: Successful ultrasound guided right thoracentesis yielding 200 mL of pleural fluid. Read by: Soyla Dryer, NP Electronically Signed   By: Ruthann Cancer M.D.   On: 05/08/2022 14:27        Scheduled Meds:  carbidopa-levodopa  1 tablet Oral TID   clopidogrel  75 mg Oral Daily   enoxaparin (LOVENOX) injection  40 mg Subcutaneous T24M   folic acid  1 mg Oral Daily   lidocaine       mouth rinse  15 mL Mouth Rinse BID   metoprolol tartrate  25 mg Oral BID   multivitamin with minerals  1 tablet Per Tube Daily   pantoprazole (PROTONIX) IV  40 mg Intravenous Q24H   [START ON 05/13/2022] thiamine injection  100 mg Intravenous Daily   Continuous Infusions:  albumin human 25 g (05/08/22 1811)   dextrose 125 mL/hr at 05/08/22 1618   feeding supplement (VITAL 1.5 CAL) Stopped (05/08/22 1603)   meropenem (MERREM) IV     thiamine injection 250 mg (05/08/22 0903)     LOS: 7 days    Time spent: over 30 min    Fayrene Helper, MD [facility]   To contact the attending provider between 7A-7P or the covering provider during after hours 7P-7A, please log into the web site [email or web address] and access using universal [place] password for that web site. If you do not have the password, please call the hospital operator.  05/08/2022, 7:31 PM

## 2022-05-08 NOTE — Progress Notes (Signed)
Physical Therapy Treatment Patient Details Name: Russell Hebert MRN: [id number] DOB: [birth year] Today's Date: 05/08/2022   History of Present Illness 76 y.o. male admitted with pancreatitis, acute respiratory failure with hypoxia, multilobar pna, AKI, encephalopathy. Cortrak 6/2. Transferred to [facility]  on 6/4 due to tachycardic, tachypneic and febrile. Pending thoracentesis 05/08/22. PMHx parkinsonian syndrome, GERD, hypertension, CAD s/p CABG, history of fall in December resulting in lumbar spine fracture.    PT Comments    Patient not progressing towards PT goals today. Session focused on functional mobility and standing. Requires Max A for bed mobility and Max A of 2 for standing from EOB. Initially requires Min A for sitting balance digressing to mod A with right lateral lean due to fatigue. HR up to 138 bpm, Sp02 dropped to 87% on 4L/min 02  and RR up to 46 with activity. Pt oriented x2 today. Pt with increased WOB at rest, worsened with exertion. Noted to be a mouth breather. Fatigues quickly. Discharge recommendation updated to SNF per family request and likely more appropriate due to inability to tolerate intensity of AIR. Will follow.   Recommendations for follow up therapy are one component of a multi-disciplinary discharge planning process, led by the attending physician.  Recommendations may be updated based on patient status, additional functional criteria and insurance authorization.  Follow Up Recommendations  Skilled nursing-short term rehab (<3 hours/day)     Assistance Recommended at Discharge Frequent or constant Supervision/Assistance  Patient can return home with the following Two people to help with walking and/or transfers;Two people to help with bathing/dressing/bathroom;Assistance with cooking/housework;Assistance with feeding;Direct supervision/assist for medications management;Direct supervision/assist for financial management;Assist for transportation;Help with stairs or  ramp for entrance   Equipment Recommendations  Other (comment) (defer to SNF)    Recommendations for Other Services       Precautions / Restrictions Precautions Precautions: Fall;Other (comment) Precaution Comments: monitor vitals (HR high, RR high, Sats low), bil mitts, flexiseal Restrictions Weight Bearing Restrictions: No     Mobility  Bed Mobility Overal bed mobility: Needs Assistance Bed Mobility: Supine to Sit, Sit to Supine     Supine to sit: Max assist, HOB elevated Sit to supine: Mod assist, +2 for physical assistance   General bed mobility comments: Assist with LEs, trunk and scooting bottom to EOB with little initiation of movement despite cues and increased time. Right lateral lean. Assist to bring LEs back into bed and lower trunk    Transfers Overall transfer level: Needs assistance Equipment used: Rolling walker (2 wheels) Transfers: Sit to/from Stand Sit to Stand: Max assist, +2 physical assistance           General transfer comment: Max A of 2 to stand from EOB with assist for anterior weight shift, blocking right foot and upright. Pt flexed at hips/knees. Posterior lean through hips. Attempted x2 however pt with increased WOB and fatigue.    Ambulation/Gait               General Gait Details: Unable   Marine scientist [place] (Stroke Patients Only)       Balance Overall balance assessment: Needs assistance Sitting-balance support: Feet supported, Bilateral upper extremity supported Sitting balance-Leahy Scale: Poor Sitting balance - Comments: Pt with right lateral lean, difficulty maintaining upright balance requiring Min A and digressing to Mod A when fatigued. Postural control: Right lateral lean Standing balance support: During functional activity Standing balance-Leahy  Scale: Poor Standing balance comment: Requires Max A of 2 and use of RW. Stood for 20 seconds, difficulty getting  upright.                            Cognition Arousal/Alertness: Awake/alert Behavior During Therapy: Flat affect Overall Cognitive Status: Impaired/Different from baseline Area of Impairment: Orientation, Following commands, Safety/judgement, Awareness, Problem solving                 Orientation Level: Disoriented to, Time, Situation ("january")     Following Commands: Follows one step commands inconsistently, Follows one step commands with increased time Safety/Judgement: Decreased awareness of safety, Decreased awareness of deficits Awareness: Intellectual Problem Solving: Slow processing, Difficulty sequencing, Requires verbal cues, Requires tactile cues General Comments: Requires repetition to answer questions/follow commands, Slow processing. Difficulty with initiation of movement, motor planning, following commands consistently.Knows he is in the hospital at [facility].        Exercises      General Comments General comments (skin integrity, edema, etc.): HR up to 138 bpm during activity, Sp02 dropped to 87% on 4L/min 02 [place], RR up to 46 with activity.      Pertinent Vitals/Pain Pain Assessment Pain Assessment: Faces Faces Pain Scale: Hurts little more Pain Location: generalized with movement Pain Descriptors / Indicators: Grimacing, Discomfort Pain Intervention(s): Monitored during session, Repositioned, Limited activity within patient's tolerance    Home Living                          Prior Function            PT Goals (current goals can now be found in the care plan section) Progress towards PT goals: Not progressing toward goals - comment (due to worsening cognition/vitals)    Frequency    Min 2X/week      PT Plan Discharge plan needs to be updated    Co-evaluation              AM-PAC PT "6 Clicks" Mobility   Outcome Measure  Help needed turning from your back to your side while in a flat bed without using bedrails?: A  Lot Help needed moving from lying on your back to sitting on the side of a flat bed without using bedrails?: Total Help needed moving to and from a bed to a chair (including a wheelchair)?: Total Help needed standing up from a chair using your arms (e.g., wheelchair or bedside chair)?: Total Help needed to walk in hospital room?: Total Help needed climbing 3-5 steps with a railing? : Total 6 Click Score: 7    End of Session Equipment Utilized During Treatment: Oxygen;Gait belt Activity Tolerance: Treatment limited secondary to medical complications (Comment) (tachycardia, 02, RR) Patient left: in bed;with call bell/phone within reach;with bed alarm set Nurse Communication: Mobility status PT Visit Diagnosis: Unsteadiness on feet (R26.81);Muscle weakness (generalized) (M62.81);History of falling (Z91.81);Difficulty in walking, not elsewhere classified (R26.2)     Time: 6144-3154 PT Time Calculation (min) (ACUTE ONLY): 24 min  Charges:  $Therapeutic Activity: 23-37 mins                     Marisa Severin, PT, DPT Acute Rehabilitation Services Secure chat preferred Office [phone]      Russell Hebert 05/08/2022, 12:20 PM

## 2022-05-08 NOTE — Assessment & Plan Note (Addendum)
Addendum 4:09 PM  Looks worse resp status wise after thoracentesis Repeat CXR, initial without pneumothorax Increased wob, grunting, lots of upper airway sounds Discussed with daughter regarding his decline.  He'd be appropriate for intubation, mechanical ventilation with this decline in resp status/AMS, but we discussed his DNR would indicate that was not something he'd want.  Dr. Deretha Emory had discussed on admission with patient and he was reportedly clear with regards to his DNR.  His daughter is aware that he has Russell Hebert DNR.  Discussed we'd follow his CXR, follow these results and discuss further afterwards.  Will give opiates for air hunger right now to help with his comfort.  I'm concerned about his overall prognosis and potential for survival.  She asked if her brother should come in from out of town and I recommended he do that.  Discussed having palliative care involved for further goals of care discussion in case of need for transition to comfort measures, she's agreeable.   Given additional info with regards to his empyema, maybe worth seeing how he does with additional tx given our additional info.

## 2022-05-09 ENCOUNTER — Inpatient Hospital Stay (HOSPITAL_COMMUNITY): Payer: Medicare PPO

## 2022-05-09 DIAGNOSIS — Z7189 Other specified counseling: Secondary | ICD-10-CM

## 2022-05-09 DIAGNOSIS — Z515 Encounter for palliative care: Secondary | ICD-10-CM

## 2022-05-09 DIAGNOSIS — K859 Acute pancreatitis without necrosis or infection, unspecified: Secondary | ICD-10-CM | POA: Diagnosis not present

## 2022-05-09 LAB — CBC WITH DIFFERENTIAL/PLATELET
Abs Immature Granulocytes: 0.1 10*3/uL — ABNORMAL HIGH (ref 0.00–0.07)
Basophils Absolute: 0 10*3/uL (ref 0.0–0.1)
Basophils Relative: 0 %
Eosinophils Absolute: 0.1 10*3/uL (ref 0.0–0.5)
Eosinophils Relative: 1 %
HCT: 31.7 % — ABNORMAL LOW (ref 39.0–52.0)
Hemoglobin: 9.8 g/dL — ABNORMAL LOW (ref 13.0–17.0)
Immature Granulocytes: 1 %
Lymphocytes Relative: 9 %
Lymphs Abs: 1 10*3/uL (ref 0.7–4.0)
MCH: 28.4 pg (ref 26.0–34.0)
MCHC: 30.9 g/dL (ref 30.0–36.0)
MCV: 91.9 fL (ref 80.0–100.0)
Monocytes Absolute: 1 10*3/uL (ref 0.1–1.0)
Monocytes Relative: 9 %
Neutro Abs: 8.6 10*3/uL — ABNORMAL HIGH (ref 1.7–7.7)
Neutrophils Relative %: 80 %
Platelets: 220 10*3/uL (ref 150–400)
RBC: 3.45 MIL/uL — ABNORMAL LOW (ref 4.22–5.81)
RDW: 15.8 % — ABNORMAL HIGH (ref 11.5–15.5)
WBC: 10.7 10*3/uL — ABNORMAL HIGH (ref 4.0–10.5)
nRBC: 0 % (ref 0.0–0.2)

## 2022-05-09 LAB — GLUCOSE, CAPILLARY
Glucose-Capillary: 149 mg/dL — ABNORMAL HIGH (ref 70–99)
Glucose-Capillary: 184 mg/dL — ABNORMAL HIGH (ref 70–99)
Glucose-Capillary: 172 mg/dL — ABNORMAL HIGH (ref 70–99)
Glucose-Capillary: 223 mg/dL — ABNORMAL HIGH (ref 70–99)

## 2022-05-09 LAB — COMPREHENSIVE METABOLIC PANEL
ALT: 20 U/L (ref 0–44)
AST: 29 U/L (ref 15–41)
Albumin: 2.2 g/dL — ABNORMAL LOW (ref 3.5–5.0)
Alkaline Phosphatase: 42 U/L (ref 38–126)
Anion gap: 7 (ref 5–15)
BUN: 31 mg/dL — ABNORMAL HIGH (ref 8–23)
CO2: 25 mmol/L (ref 22–32)
Calcium: 8.2 mg/dL — ABNORMAL LOW (ref 8.9–10.3)
Chloride: 117 mmol/L — ABNORMAL HIGH (ref 98–111)
Creatinine, Ser: 1.13 mg/dL (ref 0.61–1.24)
GFR, Estimated: >60 mL/min (ref >60)
Glucose, Bld: 174 mg/dL — ABNORMAL HIGH (ref 70–99)
Potassium: 4.2 mmol/L (ref 3.5–5.1)
Sodium: 149 mmol/L — ABNORMAL HIGH (ref 135–145)
Total Bilirubin: 0.8 mg/dL (ref 0.3–1.2)
Total Protein: 5.4 g/dL — ABNORMAL LOW (ref 6.5–8.1)

## 2022-05-09 LAB — PROCALCITONIN: Procalcitonin: 1.5 ng/mL

## 2022-05-09 LAB — PHOSPHORUS: Phosphorus: 3 mg/dL (ref 2.5–4.6)

## 2022-05-09 LAB — MAGNESIUM: Magnesium: 2.5 mg/dL — ABNORMAL HIGH (ref 1.7–2.4)

## 2022-05-09 LAB — CULTURE, BLOOD (ROUTINE X 2)
Culture: NO GROWTH
Culture: NO GROWTH
Special Requests: ADEQUATE
Special Requests: ADEQUATE

## 2022-05-09 LAB — CYTOLOGY - NON PAP

## 2022-05-09 MED ORDER — FOLIC ACID 1 MG PO TABS: 1.0000 mg | ORAL_TABLET | Freq: Every day | ORAL | Status: DC

## 2022-05-09 MED ORDER — SODIUM CHLORIDE 0.9 % IV SOLN
0.5000 mg/h | INTRAVENOUS | Status: DC
  Administered 2022-05-09: 0.5 mg/h via INTRAVENOUS
  Filled 2022-05-09 (×2): qty 2.5

## 2022-05-09 MED ORDER — ONDANSETRON HCL 4 MG/2ML IJ SOLN: 4.0000 mg | Freq: Four times a day (QID) | INTRAMUSCULAR | Status: DC | PRN

## 2022-05-09 MED ORDER — ALBUTEROL SULFATE (2.5 MG/3ML) 0.083% IN NEBU: 2.5000 mg | INHALATION_SOLUTION | RESPIRATORY_TRACT | Status: DC | PRN

## 2022-05-09 MED ORDER — HALOPERIDOL LACTATE 2 MG/ML PO CONC
0.5000 mg | ORAL | Status: DC | PRN
  Filled 2022-05-09: qty 5

## 2022-05-09 MED ORDER — HALOPERIDOL LACTATE 5 MG/ML IJ SOLN: 0.5000 mg | INTRAMUSCULAR | Status: DC | PRN

## 2022-05-09 MED ORDER — BIOTENE DRY MOUTH MT LIQD: 15.0000 mL | OROMUCOSAL | Status: DC | PRN

## 2022-05-09 MED ORDER — HALOPERIDOL 0.5 MG PO TABS
0.5000 mg | ORAL_TABLET | ORAL | Status: DC | PRN
  Filled 2022-05-09: qty 1

## 2022-05-09 MED ORDER — ONDANSETRON 4 MG PO TBDP: 4.0000 mg | ORAL_TABLET | Freq: Four times a day (QID) | ORAL | Status: DC | PRN

## 2022-05-09 MED ORDER — ACETAMINOPHEN 650 MG RE SUPP
650.0000 mg | Freq: Four times a day (QID) | RECTAL | Status: DC | PRN
Start: 2022-05-09 — End: 2022-05-09

## 2022-05-09 MED ORDER — GLYCOPYRROLATE 1 MG PO TABS
1.0000 mg | ORAL_TABLET | ORAL | Status: DC | PRN
  Filled 2022-05-09: qty 1

## 2022-05-09 MED ORDER — ACETAMINOPHEN 325 MG PO TABS
650.0000 mg | ORAL_TABLET | Freq: Four times a day (QID) | ORAL | Status: DC | PRN
Start: 2022-05-09 — End: 2022-05-09

## 2022-05-09 MED ORDER — POLYVINYL ALCOHOL 1.4 % OP SOLN: 1.0000 [drp] | Freq: Four times a day (QID) | OPHTHALMIC | Status: DC | PRN

## 2022-05-09 MED ORDER — CHLORPROMAZINE HCL 25 MG PO TABS: 25.0000 mg | ORAL_TABLET | Freq: Four times a day (QID) | ORAL | Status: DC | PRN

## 2022-05-09 MED ORDER — CARBIDOPA-LEVODOPA 25-100 MG PO TABS
1.0000 | ORAL_TABLET | Freq: Three times a day (TID) | ORAL | Status: DC
Start: 2022-05-09 — End: 2022-05-10
  Filled 2022-05-09 (×4): qty 1

## 2022-05-09 MED ORDER — GLYCOPYRROLATE 0.2 MG/ML IJ SOLN: 0.2000 mg | INTRAMUSCULAR | Status: DC | PRN

## 2022-05-09 MED ORDER — PANTOPRAZOLE SODIUM 40 MG PO TBEC: 40.0000 mg | DELAYED_RELEASE_TABLET | Freq: Every day | ORAL | Status: DC

## 2022-05-09 MED ORDER — TEMAZEPAM 7.5 MG PO CAPS: 7.5000 mg | ORAL_CAPSULE | Freq: Every evening | ORAL | Status: DC | PRN

## 2022-05-09 MED ORDER — PANTOPRAZOLE 2 MG/ML SUSPENSION: 40.0000 mg | Freq: Every day | ORAL | Status: DC

## 2022-05-09 MED ORDER — MORPHINE 100MG IN NS 100ML (1MG/ML) PREMIX INFUSION: 2.0000 mg/h | INTRAVENOUS | Status: DC

## 2022-05-09 MED ORDER — METOPROLOL TARTRATE 25 MG PO TABS
25.0000 mg | ORAL_TABLET | Freq: Two times a day (BID) | ORAL | Status: DC
Start: 2022-05-09 — End: 2022-05-09

## 2022-05-09 MED ORDER — CLOPIDOGREL BISULFATE 75 MG PO TABS
75.0000 mg | ORAL_TABLET | Freq: Every day | ORAL | Status: DC
Start: 2022-05-10 — End: 2022-05-09

## 2022-05-09 NOTE — Progress Notes (Signed)
Inpatient Diabetes Program Recommendations  AACE/ADA: New Consensus Statement on Inpatient Glycemic Control (2015)  Target Ranges:  Prepandial:   less than 140 mg/dL      Peak postprandial:   less than 180 mg/dL (1-2 hours)      Critically ill patients:  140 - 180 mg/dL   Lab Results  Component Value Date   GLUCAP 184 (H) 05/09/2022   HGBA1C 5.5 05/05/2022    Review of Glycemic Control  Latest Reference Range & Units 05/08/22 08:02 05/08/22 12:46 05/08/22 17:46 05/08/22 19:11 05/08/22 23:28 05/09/22 03:03 05/09/22 07:55  Glucose-Capillary 70 - 99 mg/dL 157 (H) 137 (H) 184 (H) 138 (H) 156 (H) 149 (H) 184 (H)  (H): Data is abnormally high  Diabetes history: No DM history Outpatient Diabetes medications: None Current orders for Inpatient glycemic control: cbgs Q4H, Vital tube feeds at 60 ml/hr  Inpatient Diabetes Program Recommendations:    If appropriate, might consider:  Glycemic control order set using Novolog 0-9 units Q4H  Will continue to follow while inpatient.  Thank you, Reche Dixon, MSN, [place] Diabetes Coordinator Inpatient Diabetes Program [phone] (team pager from 8a-5p)

## 2022-05-09 NOTE — IPAL (Signed)
  Interdisciplinary Goals of Care Family Meeting   Date carried out: 05/09/2022  Location of the meeting: Unit  Member's involved: Physician, Bedside Registered Nurse, and Family Member or next of kin  Durable Power of Attorney or acting medical decision maker:  Yes discussed with Russell Hebert, her family and Russell Hebert attendance    Discussion: We discussed goals of care for Russell Hebert .    Russell has had some progressive decline, BP a little softer, more sterorous breahting Long d/w family re: unlikely unmodifiable terminal trajectory  Code status: Full DNR  Disposition: In-patient comfort care Unless we are surprised in next 48 hours, I think Russell will die in house  Time spent for the meeting:   [place], MD  05/09/2022, 5:01 PM

## 2022-05-09 NOTE — Progress Notes (Addendum)
This chaplain responded to PMT consult for spiritual care presence and prayer. The chaplain reviewed the chart notes and checked in with the Russell Hebert. RN-Chat before the visit.  The Russell Hebert. opened his eyes to the call of his name. The Russell Hebert. eyes remained opened throughout the visit while holding the Russell Hebert. hand. The Russell Hebert. prayed with the Russell Hebert. before leaving the Russell Hebert. bedside.  ** The chaplain introduced herself to Carol's brother-Chris and another visitor-Kaylee as the chaplain was leaving the room. Both were appreciative of the visit.  Chaplain Sallyanne Kuster [phone]

## 2022-05-09 NOTE — Progress Notes (Signed)
On and off productive cough, congested  deep suctioning done with suction cath. With thick white sputum in minimal amount. Tube feeding held  MD aware . Continue to monitor.

## 2022-05-09 NOTE — Progress Notes (Addendum)
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 Gevena Cotton Reflux, HTN Prior admissions for syncope/AKI in the setting of orthostasis--- recent cardiology office visit 01/2022 discontinued Coreg because of hypotension and prior dizziness  Drinks 3-4 beers 2-3 times a week  Admit 5/30 radiating abdominal pain-initial concern for ruptured AAA CT abdomen pelvis moderate noncomplicated pancreatitis right upper quadrant [place] = cholelithiasis 6/2: GI consulted-lipase 6700 AST 68 CBD 5 mm-repeat CT progressive worsening edema inflammation around pancreatic tail without necrosis-acute severe pancreatitis diagnosed in the setting of heavy EtOH-core track placed for nutrition 6/4 rapid response called 2/2 tachycardia tachypnea febrile?  Pneumonia-speech consulted secondary to dysphagia 6/6: On x-ray found to have moderate effusion and atelectasis?  Effusion/empyema-thoracentesis performed by IR gram-negative rods--pulmonology consulted--secondary to acute respiratory failure-not a candidate for open chest tube placement  Hospital-Problem based course  Sepsis secondary to hypoxic respiratory failure from empyema/bilateral effusions Leukocytosis developed 6/7 concern for pneumonia on CT chest 6/6 Status post R thoracentesis 200 mL frothy yellow fluid Thoracentesis showed gram-negative rods Antibiotics since 6/2 currently meropenem-White count down from 13-10 Given no pocket for drain as well as very low albumin not a very appropriate candidate for further procedure per  pulmonary Hypoxic respiratory failure type I Secondary to moderate effusions gram-negative rods Supportive management continue antibiotics Acute metabolic encephalopathy during hospitalization with waxing waning encephalopathy Underlying heavy EtOH on a regular basis prior to admission Delirium felt to be secondary to be multifactorial opiates hospitalization AKI Patient was given high-dose thiamine injections to 50 daily for 5 doses and then 100 daily STOP ALL OPIATES--STOP DIAZEPAM--see if this improves mentation Acute severe acute pancreatitis with cholelithiasis--this progressed during hospitalization Appreciate of of GI input--not a candidate at this time for lap chole Core track placed 6/2 dysphagia 1 diet Toradol 15 every 6 as needed pain Dilaudid sparingly 0.5 every 2 as needed IV AKI on admit + contrast-induced nephropathy and SIRS causing pancreatitis  Lasix now off at this time given progressing worsening respiratory symptoms Albumin 25 mg 3 doses given Continue D5 125 cc/H  Underlying parkinsonism Continue carbidopa 1 3 times daily   DVT prophylaxis: lovenox Code Status: DNAR Family Communication: daughter Elgie Collard [phone] at bedside Disposition:  Status is: Inpatient Remains inpatient appropriate because:   sick   Consultants:    Procedures:   Antimicrobials: meropenem currently    Subjective:  Listless WOB is increased Has coretrakc in I saw him this am and again this pm, and he is notable to make sense  This is a huge change from usual baseline  Objective: Vitals:   05/08/22 2132 05/08/22 2300 05/09/22 0300 05/09/22 0315  BP: 112/63 118/67 120/69   Pulse: (!) 120 84 (!) 114 (!) 113  Resp: (!) 28 (!) 26 (!) 29 (!) 26  Temp:  99.6 F (37.6 C) 98.9 F (37.2 C)   TempSrc:  Axillary Axillary   SpO2: 93% 94% 92% 94%  Weight:   72.3 kg   Height:        Intake/Output Summary (Last 24 hours) at 05/09/2022 0710 Last data filed at 05/09/2022  0630 Gross per 24 hour  Intake 1492.38 ml  Output  2300 ml  Net -807.62 ml   Filed Weights   05/07/22 0500 05/08/22 0500 05/09/22 0300  Weight: 72.6 kg 72 kg 72.3 kg    Examination:  Eomi ncat  No ict no pallor Cta coarse with wheezes Abd soft --epig tenderness No le edema Neuro intact  Data Reviewed: personally reviewed   CBC    Component Value Date/Time   WBC 10.7 (H) 05/09/2022 0026   RBC 3.45 (L) 05/09/2022 0026   HGB 9.8 (L) 05/09/2022 0026   HCT 31.7 (L) 05/09/2022 0026   PLT 220 05/09/2022 0026   MCV 91.9 05/09/2022 0026   MCH 28.4 05/09/2022 0026   MCHC 30.9 05/09/2022 0026   RDW 15.8 (H) 05/09/2022 0026   LYMPHSABS 1.0 05/09/2022 0026   MONOABS 1.0 05/09/2022 0026   EOSABS 0.1 05/09/2022 0026   BASOSABS 0.0 05/09/2022 0026      Latest Ref Rng & Units 05/09/2022   12:26 AM 05/08/2022    6:15 PM 05/08/2022   12:53 PM  CMP  Glucose 70 - 99 mg/dL 174   188   144    BUN 8 - 23 mg/dL '31   29   27    '$ Creatinine 0.61 - 1.24 mg/dL 1.13   1.26   1.14    Sodium 135 - 145 mmol/L 149   148   150    Potassium 3.5 - 5.1 mmol/L 4.2   4.6   4.9    Chloride 98 - 111 mmol/L 117   115   114    CO2 22 - 32 mmol/L '25   25   25    '$ Calcium 8.9 - 10.3 mg/dL 8.2   8.2   8.3    Total Protein 6.5 - 8.1 g/dL 5.4      Total Bilirubin 0.3 - 1.2 mg/dL 0.8      Alkaline Phos 38 - 126 U/L 42      AST 15 - 41 U/L 29      ALT 0 - 44 U/L 20         Radiology Studies: DG Chest 1 View  Result Date: 05/08/2022 CLINICAL DATA:  Status post right thoracentesis EXAM: CHEST  1 VIEW COMPARISON:  Same day CT FINDINGS: Stable cardiomediastinal contours status post sternotomy and CABG. Enteric tube loops within patient's hiatal hernia and extends below the diaphragm. Interval decrease in right-sided pleural effusion status post thoracentesis. Persistent small left pleural effusion. Streaky bilateral perihilar and bibasilar opacities, likely atelectasis. No pneumothorax. IMPRESSION: 1. Decreased right  pleural effusion status post thoracentesis. No pneumothorax. 2. Persistent small left pleural effusion. 3. Streaky bilateral perihilar and bibasilar opacities, likely atelectasis. Electronically Signed   By: Davina Poke D.O.   On: 05/08/2022 12:41   CT CHEST WO CONTRAST  Result Date: 05/08/2022 CLINICAL DATA:  Respiratory illness, nondiagnostic xray. Shortness of breath. EXAM: CT CHEST WITHOUT CONTRAST TECHNIQUE: Multidetector CT imaging of the chest was performed following the standard protocol without IV contrast. RADIATION DOSE REDUCTION: This exam was performed according to the departmental dose-optimization program which includes automated exposure control, adjustment of the mA and/or kV according to patient size and/or use of iterative reconstruction technique. COMPARISON:  CTA chest, abdomen, and pelvis 04/28/2022. CT abdomen and pelvis 05/06/2022. FINDINGS: Cardiovascular: Aortic and coronary atherosclerosis. Unchanged aneurysmal dilatation of the ascending aorta with maximal diameter of 4.4 cm. Status post CABG. Normal heart size. No pericardial effusion. Mediastinum/Nodes: No enlarged axillary, mediastinal, or hilar lymph nodes identified within limitation of  noncontrast technique. Unchanged large sliding hiatal hernia. Unremarkable thyroid. Lungs/Pleura: Moderate pleural effusions, right larger than left, are largely new from 04/23/2022 but are stable to perhaps mildly larger than on 05/06/2022. Associated compressive atelectasis in the lower lobes. Small focus of subpleural ground-glass opacity anteriorly in the right middle lobe, new from 04/06/2022 but unchanged from 05/06/2022 and likely infectious/inflammatory. Upper Abdomen: Partially visualized upper abdominal inflammation corresponding to known acute pancreatitis, more fully evaluated on the recent abdominal CT. Feeding tube terminating in the gastric body. Musculoskeletal: Partially visualized chronic thoracolumbar compression fractures.  IMPRESSION: 1. Moderate pleural effusions with atelectasis in the lower lobes. 2. Partially visualized upper abdominal inflammation corresponding to known acute pancreatitis. 3. Large hiatal hernia. 4. Unchanged 4.4 cm ascending aortic aneurysm. Recommend annual imaging followup by CTA or MRA. This recommendation follows 2010 ACCF/AHA/AATS/ACR/ASA/SCA/SCAI/SIR/STS/SVM Guidelines for the Diagnosis and Management of Patients with Thoracic Aortic Disease. Circulation. 2010; 121: Q119-E174. Aortic aneurysm NOS (ICD10-I71.9) 5. Aortic Atherosclerosis (ICD10-I70.0). Electronically Signed   By: Logan Bores M.D.   On: 05/08/2022 09:05   DG CHEST PORT 1 VIEW  Result Date: 05/08/2022 CLINICAL DATA:  Shortness of breath. EXAM: PORTABLE CHEST 1 VIEW COMPARISON:  05/08/2022, earlier same day FINDINGS: 1551 hours. Low volume film. The cardio pericardial silhouette is enlarged. Minimal basilar atelectasis with probable tiny left pleural effusion. Feeding tube is again noted to be looped in the patient's large hiatal hernia. Bones are diffusely demineralized. Telemetry leads overlie the chest. IMPRESSION: 1. Low volume film with bibasilar atelectasis and probable tiny left pleural effusion. 2. Feeding tube remains looped in the patient's large hiatal hernia although distal tip is not included on the film. Electronically Signed   By: Misty Stanley M.D.   On: 05/08/2022 16:03   IR THORACENTESIS ASP PLEURAL SPACE W/IMG GUIDE  Result Date: 05/08/2022 INDICATION: Patient with bilateral pleural effusions presents today for a diagnostic and therapeutic thoracentesis. EXAM: ULTRASOUND GUIDED THORACENTESIS MEDICATIONS: 1% lidocaine 10 mL COMPLICATIONS: None immediate. PROCEDURE: An ultrasound guided thoracentesis was thoroughly discussed with the patient and questions answered. The benefits, risks, alternatives and complications were also discussed. The patient understands and wishes to proceed with the procedure. Written consent was  obtained. Ultrasound was performed to localize and mark an adequate pocket of fluid in the right chest. The area was then prepped and draped in the normal sterile fashion. 1% Lidocaine was used for local anesthesia. Under ultrasound guidance a 6 Fr Safe-T-Centesis catheter was introduced. Thoracentesis was performed. The catheter was removed and a dressing applied. FINDINGS: A total of approximately 200 mL of frothy yellow fluid was removed. Samples were sent to the laboratory as requested by the clinical team. IMPRESSION: Successful ultrasound guided right thoracentesis yielding 200 mL of pleural fluid. Read by: Soyla Dryer, NP Electronically Signed   By: Ruthann Cancer M.D.   On: 05/08/2022 14:27     Scheduled Meds:  carbidopa-levodopa  1 tablet Per Tube TID   [START ON 05/20/22] clopidogrel  75 mg Per Tube Daily   enoxaparin (LOVENOX) injection  40 mg Subcutaneous Q24H   [START ON 0/07/1447] folic acid  1 mg Per Tube Daily   mouth rinse  15 mL Mouth Rinse BID   metoprolol tartrate  25 mg Per Tube BID   multivitamin with minerals  1 tablet Per Tube Daily   pantoprazole sodium  40 mg Per Tube Daily   [START ON 05/13/2022] thiamine injection  100 mg Intravenous Daily   Continuous Infusions:  dextrose 125 mL/hr at  05/08/22 1618   feeding supplement (VITAL 1.5 CAL) Stopped (05/08/22 1603)   meropenem (MERREM) IV Stopped (05/08/22 2315)   thiamine injection 250 mg (05/08/22 0903)     LOS: 8 days   Time spent: Dixon, MD [facility] To contact the attending provider between 7A-7P or the covering provider during after hours 7P-7A, please log into the web site [email or web address] and access using universal [place] password for that web site. If you do not have the password, please call the hospital operator.  05/09/2022, 7:10 AM

## 2022-05-09 NOTE — Progress Notes (Signed)
SLP Cancellation Note  Patient Details Name: Russell Hebert MRN: [id number] DOB: 06-20-1946   Cancelled treatment:       Reason Eval/Treat Not Completed: Patient not medically ready. Pt continues to worsen. Open mouth breathing with mask. Pt already with tenuous tolerance of minimal PO. Now appropriately NPO. Pt will need to improve from a respiratory standpoint prior to further PO. Pt should be oral care QID given NPO status and high risk status   Yesmin Mutch, Katherene Ponto 05/09/2022, 12:10 PM

## 2022-05-09 NOTE — Consult Note (Signed)
Palliative Medicine Inpatient Consult Note  Consulting Provider: Elodia Hebert., MD  Reason for consult:   [place] Palliative Medicine Consult  Reason for Consult? goals of care, declining with pancreatitis   05/09/2022  HPI:  Per intake H&P --> Russell Hebert is a 76 y.o. male with medical history significant of parkinsonian syndrome, GERD, hypertension, CAD s/p CABG, history of fall in December resulting in lumbar spine fracture, presented to ED with a complaint of back pain.  (+) Pancreatitis. Newly identified R sided empyema. Palliative care was asked to get involved to further address goals of care in the setting of failure to thrive.   Clinical Assessment/Goals of Care:  *Please note that this is a verbal dictation therefore any spelling or grammatical errors are due to the "[employer]" system interpretation.  I have reviewed medical records including EPIC notes, labs and imaging, received report from bedside RN, assessed the patient.    I met with Russell Hebert to further discuss diagnosis prognosis, GOC, EOL wishes, disposition and options.   I introduced Palliative Medicine as specialized medical care for people living with serious illness. It focuses on providing relief from the symptoms and stress of a serious illness. The goal is to improve quality of life for both the patient and the family.  Medical History Review and Understanding:  Reviewed patient's past medical history of parkinsonian syndrome, GERD, hypertension, CAD s/p CABG, & lumbar spine fracture.  Social History:  Russell Hebert is from [place], [place].  He is widowed.  He has 2 children, 7 grandchildren, and 2 great-grandchildren.  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  Functional and Nutritional State:  Prior to admission Russell Hebert was fully functional living independently, still driving.  Had a  good appetite.    Advance Directives:  A detailed discussion was had today regarding advanced directives.  Patient's daughter, Russell Hebert is his primary Media planner.   Code Status:  Patient is an established DNAR/DNI code status. Clarified that even with this in place we continue to provide all supportive measures to improve patients health.   Discussion:  Reviewed [name]'s seven day hospitalization. Discussed that it has been challenging in the setting of his pancreatitis, empyema, and sepsis. We reviewed that his mental state is declined and his VS are tenuous. It appears that [name] is having a hard time breathing - discussed that he is on a high amount of support oxygen.   Reviewed that it is very likely that [name] will continue to decline. I shared that he may very well not make it out of the hospital. Patients daughter was very tearful but understanding. She shares that she "just needs to doctor to tell her what to do." I shared that I will request the primary medical team speak to her. I shared that if [name] continues to worsen it would be reasonable to transition our focus to comfort care. She understood this.   In the meanwhile continue trial of interventions. If [name] neglects to improve then have advised family to strongly consider making him comfortable.   Discussed the importance of continued conversation with family and their  medical providers regarding overall plan of care and treatment options, ensuring decisions are within the context of the patients values and GOCs.  Decision Maker: Russell Hebert (daughter) [phone]  SUMMARY OF RECOMMENDATIONS   DNAR/DNI   Continue trial of treatments --> if patient should decline further family  is open to conversations regarding comfort care  Appreciate primary team provided patients daughter an update --> She is interested to hear primary perspective and wants someone to "tell her what to do"  Ongoing Palliative care  support  Code Status/Advance Care Planning: DNAR/DNI   Palliative Prophylaxis:  Aspiration, Bowel Regimen, Delirium Protocol, Frequent Pain Assessment, Oral Care, Palliative Wound Care, and Turn Reposition  Additional Recommendations (Limitations, Scope, Preferences): Continue current support  Psycho-social/Spiritual:  Desire for further Chaplaincy support: Yes - very faithful Additional Recommendations: Education on acute disease burden   Prognosis: Very limited presently due to multiple acute disease processes  Discharge Planning: Discharge plan is unclear  Vitals:   05/09/22 0300 05/09/22 0315  BP: 120/69   Pulse: (!) 114 (!) 113  Resp: (!) 29 (!) 26  Temp: 98.9 F (37.2 C)   SpO2: 92% 94%    Intake/Output Summary (Last 24 hours) at 05/09/2022 0650 Last data filed at 05/09/2022 7737 Gross per 24 hour  Intake 1492.38 ml  Output 2300 ml  Net -807.62 ml   Last Weight  Most recent update: 05/09/2022  3:06 AM    Weight  72.3 kg (159 lb 6.3 oz)            Gen:  [name] in NAD HEENT: Dry mucous membranes CV: Irregular rate and rhythm PULM: ON 10LPM FM ABD: soft/nontender EXT: BLE mottling Neuro: Opens eyes, somnolent  PPS: 10%   This conversation/these recommendations were discussed with patient primary care team, Dr. Verlon Au  Billing based on MDM: High  Problems Addressed: One acute or chronic illness or injury that poses a threat to life or bodily function  Amount and/or Complexity of Data: Category 3:Discussion of management or test interpretation with external physician/other qualified health care professional/appropriate source (not separately reported)  Risks: Decision not to resuscitate or to de-escalate care because of poor prognosis ______________________________________________________ [place] Team Team Cell Phone: [phone] Please utilize secure chat with additional questions, if there is no  response within 30 minutes please call the above phone number  Palliative Medicine Team providers are available by phone from 7am to 7pm daily and can be reached through the team cell phone.  Should this patient require assistance outside of these hours, please call the patient's attending physician.

## 2022-05-09 NOTE — Progress Notes (Signed)
NAME:  Russell Hebert, MRN:  [id number], DOB:  19-Dec-1945, LOS: 28 ADMISSION DATE:  04/30/2022, CONSULTATION DATE: 6/6 REFERRING MD: Dr. Florene Glen, CHIEF COMPLAINT: Empyema  History of Present Illness:  76 year old male with PMH as below, which is significant for CAD, CABG, hypercholesterolemia, HTN, ICH, and TIA. He was admitted to [facility] 5/30 with complaints of back pain radiating to the abdomen. Laboratory evaluation concerning for acute pancreatitis. He was admitted to the hospitalists service and treated with bowel rest and IVF resuscitaiton. Suspected to be related to gallstones considering prior ultrasound, however, no clear etiology discovered this admission. Course then became complicated by respiratory failure with hypoxia as well as SIRS/sepsis. Workup included imaging of the chest significant for bilateral pleural effusions. Antibiotics were broadened. He underwent thoracentesis 6/6, and subsequently had a decline in his respiratory function as well as mental status. Goals of care were discussed and he was clear about DNR status. No intubation. Repeat CXR without clear etiology. Thoracentesis yielded 200cc of frothy yellow fluid. Initial pleural fluid studies with neutrophil predominance. Preliminary cultures growing GNR. PCCM consulted for empyema.    Pertinent  Medical History   has a past medical history of Carotid artery disease ([place]), Coronary artery disease, Ejection fraction, GERD (gastroesophageal reflux disease), CABG, Hypercholesterolemia, Hypertension, Intracerebral bleed ([place]), and TIA (transient ischemic attack) (2006).  Significant Hospital Events: Including procedures, antibiotic start and stop dates in addition to other pertinent events   5/30-admitted for pancreatitis 6/6 right thoracentesis  Interim History / Subjective:  Increased work of breathing Daughter at bedside Patient does appear comfortable  Objective   Blood pressure 124/65, pulse (!) 116, temperature  99.6 F (37.6 C), temperature source Axillary, resp. rate (!) 29, height '5\' 7"'$  (1.702 m), weight 72.3 kg, SpO2 95 %.    FiO2 (%):  [40 %] 40 %   Intake/Output Summary (Last 24 hours) at 05/09/2022 1615 Last data filed at 05/09/2022 1600 Gross per 24 hour  Intake 2622.38 ml  Output 1900 ml  Net 722.38 ml   Filed Weights   05/07/22 0500 05/08/22 0500 05/09/22 0300  Weight: 72.6 kg 72 kg 72.3 kg    Examination: General: Elderly, does not appear to be in extremis, increased work of breathing HENT: Moist oral mucosa Lungs: Air movement bilaterally Cardiovascular: S1-S2 appreciated Abdomen: Soft, bowel sounds appreciated Extremities: No clubbing, no edema Neuro: Awake and alert GU:   Resolved Hospital Problem list     Assessment & Plan:  Empyema with gram-negative rod in pleural fluid drained from the right pleural space  Hypoxemic respiratory failure -Continue oxygen supplementation  For empyema continue antibiotics  Acute pancreatitis -Continue supportive care -GI following  Goals of care discussion ongoing at present  Best Practice (right click and "Reselect all SmartList Selections" daily)   Diet/type: tubefeeds DVT prophylaxis: LMWH GI prophylaxis: PPI Lines: N/A Foley:  N/A Code Status:  DNR Last date of multidisciplinary goals of care discussion [pending]  Labs   CBC: Recent Labs  Lab 05/05/22 0123 05/06/22 0109 05/07/22 0011 05/08/22 0556 05/09/22 0026  WBC 5.2 6.0 10.1 13.2* 10.7*  NEUTROABS 4.2 4.2 8.2* 11.1* 8.6*  HGB 11.8* 11.3* 10.9* 10.8* 9.8*  HCT 36.4* 34.2* 33.9* 33.4* 31.7*  MCV 88.3 86.6 88.1 88.4 91.9  PLT 232 206 185 236 062    Basic Metabolic Panel: Recent Labs  Lab 05/05/22 0123 05/05/22 1554 05/06/22 0109 05/06/22 1416 05/07/22 0011 05/07/22 1659 05/08/22 0556 05/08/22 1253 05/08/22 1815 05/09/22 0026  NA  146*   < > 149*   < > 145 147* 148* 150* 148* 149*  K 3.5   < > 3.2*   < > 3.3* 4.2 4.1 4.9 4.6 4.2  CL 116*   < >  115*   < > 113* 114* 117* 114* 115* 117*  CO2 23   < > 26   < > '26 25 26 25 25 25  '$ GLUCOSE 103*   < > 142*   < > 163* 180* 194* 144* 188* 174*  BUN 32*   < > 27*   < > 23 26* 27* 27* 29* 31*  CREATININE 1.20   < > 1.15   < > 1.08 1.11 1.05 1.14 1.26* 1.13  CALCIUM 8.0*   < > 7.6*   < > 7.5* 8.0* 8.0* 8.3* 8.2* 8.2*  MG 2.4  --  2.1  --  2.0  --  2.2  --   --  2.5*  PHOS 2.1*  --  1.7*  --  2.6  --  2.2*  --   --  3.0   < > = values in this interval not displayed.   GFR: Estimated Creatinine Clearance: 51.2 mL/min (by C-G formula based on SCr of 1.13 mg/dL). Recent Labs  Lab 05/06/22 0109 05/06/22 0605 05/06/22 0849 05/06/22 1416 05/07/22 0011 05/07/22 1659 05/08/22 0556 05/09/22 0026  PROCALCITON  --   --   --   --   --  2.08 1.53 1.50  WBC 6.0  --   --   --  10.1  --  13.2* 10.7*  LATICACIDVEN  --  1.9 2.1* 1.7  --   --   --   --     Liver Function Tests: Recent Labs  Lab 05/05/22 0123 05/06/22 0109 05/07/22 0011 05/08/22 0556 05/09/22 0026  AST 41 34 37 31 29  ALT '10 19 16 21 20  '$ ALKPHOS 50 43 43 49 42  BILITOT 0.8 0.8 0.6 0.4 0.8  PROT 5.5* 5.2* 4.9* 5.4* 5.4*  ALBUMIN 2.3* 1.8* 1.7* 1.7* 2.2*   Recent Labs  Lab 05/04/22 0412  LIPASE 112*   No results for input(s): AMMONIA in the last 168 hours.  ABG    Component Value Date/Time   HCO3 20.7 10/02/2007 1034   TCO2 17 (L) 04/07/2022 1416     Coagulation Profile: No results for input(s): INR, PROTIME in the last 168 hours.  Cardiac Enzymes: No results for input(s): CKTOTAL, CKMB, CKMBINDEX, TROPONINI in the last 168 hours.  HbA1C: Hgb A1c MFr Bld  Date/Time Value Ref Range Status  05/05/2022 01:23 AM 5.5 4.8 - 5.6 % Final    Comment:    (NOTE) Pre diabetes:          5.7%-6.4%  Diabetes:              >6.4%  Glycemic control for   <7.0% adults with diabetes     CBG: Recent Labs  Lab 05/08/22 1911 05/08/22 2328 05/09/22 0303 05/09/22 0755 05/09/22 1219  GLUCAP 138* 156* 149* 184* 172*     Review of Systems:   Unobtainable  Past Medical History:  He,  has a past medical history of Carotid artery disease ([place]), Coronary artery disease, Ejection fraction, GERD (gastroesophageal reflux disease), CABG, Hypercholesterolemia, Hypertension, Intracerebral bleed ([place]), and TIA (transient ischemic attack) (2006).   Surgical History:   Past Surgical History:  Procedure Laterality Date   CORONARY ARTERY BYPASS GRAFT  2000   Acute lateral MI  and bare-metal stent circumflex..(10/08) Cath 10/14/08; LIMA to the LAD patent ; all vein grafts patent; OM stent patent..some sluggish distal flow.   IR THORACENTESIS ASP PLEURAL SPACE W/IMG GUIDE  05/08/2022     Social History:   reports that he has never smoked. He has never used smokeless tobacco. He reports current alcohol use. He reports that he does not use drugs.   Family History:  His family history includes Heart disease in his mother; Heart failure in his father and mother.   Allergies Allergies  Allergen Reactions   Morphine Anaphylaxis   Celebrex [Celecoxib] Diarrhea     Home Medications  Prior to Admission medications   Medication Sig Start Date End Date Taking? Authorizing Provider  acetaminophen (TYLENOL) 500 MG tablet Take 1,000 mg by mouth every 6 (six) hours as needed for headache (pain).   Yes [provider]  atorvastatin (LIPITOR) 40 MG tablet Take 40 mg by mouth every morning. 02/19/22  Yes [provider]  carbidopa-levodopa (SINEMET IR) 25-100 MG tablet TAKE 1 TABLET THREE TIMES DAILY AT 7AM, 11AM AND 4PM Patient taking differently: Take 1 tablet by mouth every morning. 10/30/21  Yes Tat, Eustace Quail, DO  citalopram (CELEXA) 10 MG tablet Take 10 mg by mouth every morning.   Yes [provider]  clopidogrel (PLAVIX) 75 MG tablet Take 75 mg by mouth every morning.   Yes [provider]  diazepam (VALIUM) 5 MG tablet Take 5 mg by mouth daily as needed (dizziness).   Yes [provider]  omeprazole (PRILOSEC) 20 MG capsule Take 20 mg by mouth every morning.   Yes [provider]  traMADol (ULTRAM) 50 MG tablet Take 50 mg by mouth every 6 (six) hours as needed (pain).   Yes [provider]  carbidopa-levodopa (SINEMET CR) 50-200 MG tablet Take 1 tablet by mouth at bedtime. Patient not taking: Reported on 04/04/2022 11/14/21   Tat, Eustace Quail, DO  predniSONE (DELTASONE) 20 MG tablet 2 tablet Patient not taking: Reported on 05/02/2022 10/29/21   [provider]    Sherrilyn Rist, MD [place] [facility] Pager: See Shea Evans

## 2022-05-09 NOTE — Progress Notes (Signed)
Congested, mouth suctioned with small amount of thick white sputum. Oral care done. MD made aware , ivf rate decreased to 50 ml/hr.

## 2022-05-09 NOTE — TOC Progression Note (Signed)
Transition of Care [facility]) - Progression Note    Patient Details  Name: Russell Hebert MRN: [id number] Date of Birth: 1946-05-08  Transition of [facility]) CM/SW [place], [place] Phone Number: 05/09/2022, 1:31 PM  Clinical Narrative:    CSW noted Palliative is following and pt's disposition is still pending based on clinical improvement. CSW reached out to daughter to discuss DC needs, VM left. TOC will continue to follow for further needs.   Expected Discharge Plan: [place] Barriers to Discharge: Continued Medical Work up, SNF Pending bed offer  Expected Discharge Plan and Services Expected Discharge Plan: [place] In-house Referral: Clinical Social Work   Post Acute Care Choice: [place] Living arrangements for the past 2 months: Single Family Home                                       Social Determinants of Health (SDOH) Interventions    Readmission Risk Interventions     View : No data to display.

## 2022-05-09 NOTE — Progress Notes (Signed)
Placed on comfort measure  as ordered. Family at bedside.

## 2022-05-10 DIAGNOSIS — K859 Acute pancreatitis without necrosis or infection, unspecified: Secondary | ICD-10-CM | POA: Diagnosis not present

## 2022-05-10 MED ORDER — LORAZEPAM 2 MG/ML IJ SOLN: 1.0000 mg | INTRAMUSCULAR | Status: DC

## 2022-05-10 MED ORDER — LORAZEPAM 2 MG/ML IJ SOLN
INTRAMUSCULAR | Status: AC
  Administered 2022-05-10: 1 mg via INTRAVENOUS
  Filled 2022-05-10: qty 1

## 2022-05-10 MED ORDER — LORAZEPAM 2 MG/ML IJ SOLN
0.5000 mg | INTRAMUSCULAR | Status: DC | PRN
  Administered 2022-05-10: 1 mg via INTRAVENOUS

## 2022-05-11 LAB — CULTURE, BLOOD (ROUTINE X 2)
Culture: NO GROWTH
Culture: NO GROWTH
Special Requests: ADEQUATE
Special Requests: ADEQUATE

## 2022-05-11 LAB — BODY FLUID CULTURE W GRAM STAIN: Culture: NO GROWTH

## 2022-05-17 ENCOUNTER — Ambulatory Visit: Payer: Medicare PPO | Admitting: Neurology

## 2022-05-22 LAB — MISC LABCORP TEST (SEND OUT): Labcorp test code: 9985

## 2022-06-02 NOTE — Progress Notes (Signed)
This chaplain responded to the MD consult for EOL spiritual care. The chaplain reviewed the chart notes and was updated by PMT NP-Michelle and RN-Barb before the visit.  The Pt. is resting comfortably as the chaplain and RN introduced themselves to the family. The Pt. daughter-Carol is at the bedside with other family members. The chaplain updated the family on the previous day's visit and prayer with the Pt. The chaplain affirmed the family's presence today, holding the Pt. hand, and the sharing of a comforting voice.   Russell Hebert accepted the chaplain's invitation for F/U spiritual care.  Chaplain Sallyanne Kuster [phone]

## 2022-06-02 NOTE — Progress Notes (Signed)
Patient with increase WOB, '2mg'$  bolus dilaudid given, WOB eased and patient comfortable

## 2022-06-02 NOTE — Progress Notes (Signed)
Patient peacefully passed away at 1406. Family at the bedside. [facility] called and patient not acceptable for any donations due to sepsis with gram negative bacteria.

## 2022-06-02 NOTE — Death Summary Note (Addendum)
DEATH SUMMARY   Patient Details  Name: Russell Hebert MRN: [id number] DOB: 1946-04-22 XHB:ZJIRCVEL, L.Marlou Sa, MD Admission/Discharge Information   Admit Date:  May 11, 2022  Date of Death:  May 20, 2022  Time of Death:    Length of Stay: 9   Principle Cause of death:   Sepsis from Gram negative sepcticemia  Hospital Diagnoses: Principal Problem:   Pancreatitis Active Problems:   Sepsis ([place])   Goals of care, counseling/discussion   Acute respiratory failure with hypoxia ([place])   Sinus tachycardia   Acute metabolic encephalopathy   AKI (acute kidney injury) ([place])   Metabolic acidosis   Hypernatremia   Hyperkalemia   Elevated LFTs   Hypotension   Alcohol abuse   Ascending aortic aneurysm ([facility])   Hypoglycemia   Hiatal hernia   Dysphagia   Parkinsonism ([place])   History of recent steroid use   Anxiety   Protein-calorie malnutrition, severe (H[facility])   Hypertension   GERD (gastroesophageal reflux disease)   Hx of CABG   Carotid artery disease (H[facility])   Hypertensive urgency   Hypokalemia   Leukocytosis   Hospital Course:  76 year old male community dwelling 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 Gevena Cotton Reflux, HTN Prior admissions for syncope/AKI in the setting of orthostasis--- recent cardiology office visit 01/2022 discontinued Coreg because of hypotension and prior dizziness   Drinks 3-4 beers 2-3 times a week   Admit 05-12-2023 radiating abdominal pain-initial concern for ruptured AAA CT abdomen pelvis moderate noncomplicated pancreatitis right upper quadrant [place] = cholelithiasis 6/2: GI consulted-lipase 6700 AST 68 CBD 5 mm-repeat CT progressive worsening edema inflammation around pancreatic tail without  necrosis-acute severe pancreatitis diagnosed in the setting of heavy EtOH-core track placed for nutrition 6/4 rapid response called 2/2 tachycardia tachypnea febrile?  Pneumonia-speech consulted secondary to dysphagia 6/6: On x-ray found to have moderate effusion and atelectasis?  Effusion/empyema-thoracentesis performed by IR gram-negative rods--pulmonology consulted--secondary to acute respiratory failure-not a candidate for open chest tube placement 6/7: Patient seen and examined and clinically worsening-Long discussions with primary as well as palliative care led to decision regarding ultimately full comfort care including initiation of gtt. opiates for comfort and de-escalation of care 21-May-2023: Patient continued to decline and subsequently passed 2:06 PM         The results of significant diagnostics from this hospitalization (including imaging, microbiology, ancillary and laboratory) are listed below for reference.   Significant Diagnostic Studies: DG CHEST PORT 1 VIEW  Result Date: 05/09/2022 CLINICAL DATA:  Pleural effusion. EXAM: PORTABLE CHEST 1 VIEW COMPARISON:  May 08, 2022 FINDINGS: The heart size and mediastinal contours are stable. Heart size is enlarged. Feeding tube is identified looped in the large hiatal hernia unchanged. Mild atelectasis of left lung base is identified. Probable minimal left pleural effusion is noted. The visualized skeletal structures are stable. IMPRESSION: Mild atelectasis of left lung base. Probable minimal left pleural effusion. Electronically Signed   By: Abelardo Diesel M.D.   On: 05/09/2022 07:48   DG CHEST PORT 1 VIEW  Result Date: 05/08/2022 CLINICAL DATA:  Shortness of breath. EXAM: PORTABLE CHEST 1 VIEW COMPARISON:  05/08/2022, earlier same day FINDINGS: 1551 hours. Low volume film. The cardio pericardial silhouette is enlarged. Minimal basilar atelectasis with probable tiny left pleural effusion. Feeding tube is again noted to  be looped in the patient's  large hiatal hernia. Bones are diffusely demineralized. Telemetry leads overlie the chest. IMPRESSION: 1. Low volume film with bibasilar atelectasis and probable tiny left pleural effusion. 2. Feeding tube remains looped in the patient's large hiatal hernia although distal tip is not included on the film. Electronically Signed   By: Misty Stanley M.D.   On: 05/08/2022 16:03   IR THORACENTESIS ASP PLEURAL SPACE W/IMG GUIDE  Result Date: 05/08/2022 INDICATION: Patient with bilateral pleural effusions presents today for a diagnostic and therapeutic thoracentesis. EXAM: ULTRASOUND GUIDED THORACENTESIS MEDICATIONS: 1% lidocaine 10 mL COMPLICATIONS: None immediate. PROCEDURE: An ultrasound guided thoracentesis was thoroughly discussed with the patient and questions answered. The benefits, risks, alternatives and complications were also discussed. The patient understands and wishes to proceed with the procedure. Written consent was obtained. Ultrasound was performed to localize and mark an adequate pocket of fluid in the right chest. The area was then prepped and draped in the normal sterile fashion. 1% Lidocaine was used for local anesthesia. Under ultrasound guidance a 6 Fr Safe-T-Centesis catheter was introduced. Thoracentesis was performed. The catheter was removed and a dressing applied. FINDINGS: A total of approximately 200 mL of frothy yellow fluid was removed. Samples were sent to the laboratory as requested by the clinical team. IMPRESSION: Successful ultrasound guided right thoracentesis yielding 200 mL of pleural fluid. Read by: Soyla Dryer, NP Electronically Signed   By: Ruthann Cancer M.D.   On: 05/08/2022 14:27   DG Chest 1 View  Result Date: 05/08/2022 CLINICAL DATA:  Status post right thoracentesis EXAM: CHEST  1 VIEW COMPARISON:  Same day CT FINDINGS: Stable cardiomediastinal contours status post sternotomy and CABG. Enteric tube loops within patient's hiatal hernia and extends below the  diaphragm. Interval decrease in right-sided pleural effusion status post thoracentesis. Persistent small left pleural effusion. Streaky bilateral perihilar and bibasilar opacities, likely atelectasis. No pneumothorax. IMPRESSION: 1. Decreased right pleural effusion status post thoracentesis. No pneumothorax. 2. Persistent small left pleural effusion. 3. Streaky bilateral perihilar and bibasilar opacities, likely atelectasis. Electronically Signed   By: Davina Poke D.O.   On: 05/08/2022 12:41   CT CHEST WO CONTRAST  Result Date: 05/08/2022 CLINICAL DATA:  Respiratory illness, nondiagnostic xray. Shortness of breath. EXAM: CT CHEST WITHOUT CONTRAST TECHNIQUE: Multidetector CT imaging of the chest was performed following the standard protocol without IV contrast. RADIATION DOSE REDUCTION: This exam was performed according to the departmental dose-optimization program which includes automated exposure control, adjustment of the mA and/or kV according to patient size and/or use of iterative reconstruction technique. COMPARISON:  CTA chest, abdomen, and pelvis 04/23/2022. CT abdomen and pelvis 05/06/2022. FINDINGS: Cardiovascular: Aortic and coronary atherosclerosis. Unchanged aneurysmal dilatation of the ascending aorta with maximal diameter of 4.4 cm. Status post CABG. Normal heart size. No pericardial effusion. Mediastinum/Nodes: No enlarged axillary, mediastinal, or hilar lymph nodes identified within limitation of noncontrast technique. Unchanged large sliding hiatal hernia. Unremarkable thyroid. Lungs/Pleura: Moderate pleural effusions, right larger than left, are largely new from 04/12/2022 but are stable to perhaps mildly larger than on 05/06/2022. Associated compressive atelectasis in the lower lobes. Small focus of subpleural ground-glass opacity anteriorly in the right middle lobe, new from 04/07/2022 but unchanged from 05/06/2022 and likely infectious/inflammatory. Upper Abdomen: Partially visualized  upper abdominal inflammation corresponding to known acute pancreatitis, more fully evaluated on the recent abdominal CT. Feeding tube terminating in the gastric body. Musculoskeletal: Partially visualized chronic thoracolumbar compression fractures. IMPRESSION: 1. Moderate pleural effusions with atelectasis in  the lower lobes. 2. Partially visualized upper abdominal inflammation corresponding to known acute pancreatitis. 3. Large hiatal hernia. 4. Unchanged 4.4 cm ascending aortic aneurysm. Recommend annual imaging followup by CTA or MRA. This recommendation follows 2010 ACCF/AHA/AATS/ACR/ASA/SCA/SCAI/SIR/STS/SVM Guidelines for the Diagnosis and Management of Patients with Thoracic Aortic Disease. Circulation. 2010; 121: M094-B096. Aortic aneurysm NOS (ICD10-I71.9) 5. Aortic Atherosclerosis (ICD10-I70.0). Electronically Signed   By: Logan Bores M.D.   On: 05/08/2022 09:05   CT ABDOMEN PELVIS W CONTRAST  Result Date: 05/06/2022 CLINICAL DATA:  Follow-up severe acute pancreatitis. EXAM: CT ABDOMEN AND PELVIS WITH CONTRAST TECHNIQUE: Multidetector CT imaging of the abdomen and pelvis was performed using the standard protocol following bolus administration of intravenous contrast. RADIATION DOSE REDUCTION: This exam was performed according to the departmental dose-optimization program which includes automated exposure control, adjustment of the mA and/or kV according to patient size and/or use of iterative reconstruction technique. CONTRAST:  175m OMNIPAQUE IOHEXOL 300 MG/ML  SOLN COMPARISON:  05/02/2022 FINDINGS: Lower Chest: Increased moderate bilateral pleural effusions and dependent lower lobe atelectasis compared to prior study. Hepatobiliary: No hepatic masses identified. Small contracted gallbladder. No evidence of biliary ductal dilatation. Pancreas: Severe acute pancreatitis shows no significant change in appearance since previous study. No evidence of pancreatic necrosis. Retroperitoneal fluid is again  seen tracking inferiorly in the anterior and posterior pararenal spaces, right side greater than left, without significant change. No well-formed pseudocysts are identified. Spleen: Within normal limits in size and appearance. Adrenals/Urinary Tract: No masses identified. No evidence of ureteral calculi or hydronephrosis. Stomach/Bowel: Large hiatal hernia is again seen feeding tube is seen with tip in the gastric antrum. Mild reactive wall thickening is again seen involving small bowel loops in the central abdomen. Vascular/Lymphatic: No pathologically enlarged lymph nodes. No acute vascular findings. Aortic atherosclerotic calcification incidentally noted. Reproductive:  No mass or other significant abnormality. Other:  None. Musculoskeletal: No suspicious bone lesions identified. Old compression fractures the T12, L1, L2 vertebral bodies unchanged. IMPRESSION: No significant change in severe acute pancreatitis, with retroperitoneal fluid tracking inferiorly within the right anterior and posterior pararenal spaces. Increased moderate bilateral pleural effusions and dependent lower lobe atelectasis. Stable large hiatal hernia. Aortic Atherosclerosis (ICD10-I70.0). Electronically Signed   By: JMarlaine HindM.D.   On: 05/06/2022 15:13   DG CHEST PORT 1 VIEW  Result Date: 05/06/2022 CLINICAL DATA:  Acute pancreatitis.  Coronary artery disease. EXAM: PORTABLE CHEST 1 VIEW COMPARISON:  05/04/2022 FINDINGS: Heart size is stable. Prior CABG again noted. Feeding tube is seen entering the stomach. The distal tip is below the field of view on this study. Hiatal hernia again noted. Low lung volumes are again seen. Atelectasis or infiltrate is seen in the retrocardiac left lower lobe, without significant change. Right lung is grossly clear. IMPRESSION: Stable low lung volumes and left lower lobe atelectasis versus infiltrate. Hiatal hernia. Electronically Signed   By: JMarlaine HindM.D.   On: 05/06/2022 08:49   DG CHEST  PORT 1 VIEW  Result Date: 05/04/2022 CLINICAL DATA:  Shortness of breath EXAM: PORTABLE CHEST 1 VIEW COMPARISON:  05/04/2022, CT 04/21/2022, FINDINGS: Post sternotomy changes. Esophageal tube is looped at the lower chest within a hiatal hernia, tip below the diaphragm and probably within the distal stomach. Cardiomegaly with vascular congestion and hazy lung fields likely due to mild edema. There is a small left-sided effusion. IMPRESSION: 1. Cardiomegaly with mild vascular congestion and hazy pulmonary opacity possible edema. Small left-sided effusion. 2. Esophageal tube is looped at the  lower chest within a hiatal hernia with the tip position below the diaphragm and probably within the distal stomach. Electronically Signed   By: Donavan Foil M.D.   On: 05/04/2022 21:23   DG Abd Portable 1V  Result Date: 05/04/2022 CLINICAL DATA:  [id number].  Feeding tube placement EXAM: PORTABLE ABDOMEN - 1 VIEW COMPARISON:  X-ray abdomen 62/2/23 2:25 p.m., CT abdomen pelvis 05/02/2022, CT abdomen pelvis 04/07/2022 FINDINGS: Hiatal hernia. Interval advancement of an enteric tube with tip overlying the expected region of the gastric lumen. The bowel gas pattern is normal. No radio-opaque calculi or other significant radiographic abnormality are seen. Aortic calcification. IMPRESSION: 1. Enteric tube with tip overlying the expected region of the gastric lumen. 2. Hiatal hernia. 3.  Aortic Atherosclerosis (ICD10-I70.0). Electronically Signed   By: Iven Finn M.D.   On: 05/04/2022 15:51   DG Abd Portable 1V  Result Date: 05/04/2022 CLINICAL DATA:  Feeding tube placement. EXAM: PORTABLE ABDOMEN - 1 VIEW COMPARISON:  05/04/2022 at 12:32 p.m. FINDINGS: Enteric tube tip projects in the medial left upper quadrant consistent with positioning at or just below the expected location of the gastroesophageal junction. Normal bowel gas pattern. IMPRESSION: 1. Enteric feeding tube tip lies at the expected location of the gastroesophageal  junction. This will need to be further inserted, at least 20 cm, to allow adequate length for the tube to into the duodenum. Electronically Signed   By: Lajean Manes M.D.   On: 05/04/2022 14:41   DG Abd 1 View  Result Date: 05/04/2022 CLINICAL DATA:  Epigastric abdominal pain.  Nausea. EXAM: ABDOMEN - 1 VIEW COMPARISON:  CT, 05/02/2022. FINDINGS: No bowel dilation to suggest obstruction. Aortic, iliac and femoral artery vascular calcifications. No evidence of renal or ureteral stones. No acute skeletal abnormality. IMPRESSION: 1. No evidence of bowel obstruction. 2. Findings of pancreatitis noted on the recent prior CT are not visualized radiographically. Electronically Signed   By: Lajean Manes M.D.   On: 05/04/2022 13:00   ECHOCARDIOGRAM COMPLETE  Result Date: 05/04/2022    ECHOCARDIOGRAM REPORT   Patient Name:   Russell Hebert Date of Exam: 05/04/2022 Medical Rec #:  [id number]         Height:       67.0 in Accession #:    [id number]        Weight:       171.1 lb Date of Birth:  09/29/1946          BSA:          1.892 m Patient Age:    10 years          BP:           160/94 mmHg Patient Gender: M                 HR:           104 bpm. Exam Location:  Inpatient Procedure: 2D Echo, Cardiac Doppler and Color Doppler Indications:    Other abnormalities of the heart  History:        Patient has prior history of Echocardiogram examinations. CAD,                 Prior CABG, TIA; Risk Factors:Hypertension.  Sonographer:    Jyl Heinz Referring Phys: [phone] A CALDWELL POWELL JR  Sonographer Comments: Image acquisition challenging due to uncooperative patient. No Pedoff- pt would not allow me to take any more images. IMPRESSIONS  1. Left ventricular ejection fraction,  by estimation, is 55 to 60%. The left ventricle has normal function. The left ventricle has no regional wall motion abnormalities. Left ventricular diastolic parameters are indeterminate.  2. Right ventricular systolic function is normal. The right  ventricular size is normal.  3. The mitral valve is normal in structure. No evidence of mitral valve regurgitation.  4. There is moderate calcification of the aortic valve. Aortic valve regurgitation is not visualized. Mild aortic valve stenosis.  5. 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  6. Unable to visualize. Comparison(s): No significant change from prior study. FINDINGS  Left Ventricle: Left ventricular ejection fraction, by estimation, is 55 to 60%. The left ventricle has normal function. The left ventricle has no regional wall motion abnormalities. The left ventricular internal cavity size was normal in size. There is  no left ventricular hypertrophy. Left ventricular diastolic parameters are indeterminate. Right Ventricle: The right ventricular size is normal. Right ventricular systolic function is normal. Left Atrium: Left atrial size was normal in size. Right Atrium: Right atrial size was normal in size. Pericardium: There is no evidence of pericardial effusion. Mitral Valve: The mitral valve is normal in structure. There is mild calcification of the mitral valve leaflet(s). No evidence of mitral valve regurgitation. Tricuspid Valve: The tricuspid valve is normal in structure. Tricuspid valve regurgitation is not demonstrated. Aortic Valve: There is moderate calcification of the aortic valve. Aortic valve regurgitation is not visualized. Mild aortic stenosis is present. Aortic valve mean gradient measures 13.5 mmHg. Aortic valve peak gradient measures 22.0 mmHg. Aortic valve area, by VTI measures 1.44 cm. Pulmonic Valve: The pulmonic valve was normal in structure. Pulmonic valve regurgitation is not visualized. Aorta: Small ascending aortic aneurysm 4.2 cm. Venous: Unable to visualize. IAS/Shunts: The interatrial septum was not well visualized.  LEFT VENTRICLE PLAX 2D LVIDd:         4.20 cm      Diastology LVIDs:         3.00 cm      LV e' medial:    5.13 cm/s LV PW:         0.80 cm      LV E/e'  medial:  24.6 LV IVS:        0.90 cm      LV e' lateral:   11.80 cm/s LVOT diam:     2.00 cm      LV E/e' lateral: 10.7 LV SV:         54 LV SV Index:   29 LVOT Area:     3.14 cm  LV Volumes (MOD) LV vol d, MOD A2C: 89.2 ml LV vol d, MOD A4C: 106.0 ml LV vol s, MOD A2C: 35.6 ml LV vol s, MOD A4C: 38.0 ml LV SV MOD A2C:     53.6 ml LV SV MOD A4C:     106.0 ml LV SV MOD BP:      60.6 ml RIGHT VENTRICLE RV Basal diam:  3.30 cm RV S prime:     20.40 cm/s TAPSE (M-mode): 1.7 cm LEFT ATRIUM             Index        RIGHT ATRIUM           Index LA diam:        3.20 cm 1.69 cm/m   RA Area:     15.90 cm LA Vol (A2C):   34.2 ml 18.07 ml/m  RA Volume:   46.40 ml  24.52  ml/m LA Vol (A4C):   53.3 ml 28.17 ml/m LA Biplane Vol: 42.8 ml 22.62 ml/m  AORTIC VALVE AV Area (Vmax):    1.54 cm AV Area (Vmean):   1.48 cm AV Area (VTI):     1.44 cm AV Vmax:           234.50 cm/s AV Vmean:          180.000 cm/s AV VTI:            0.374 m AV Peak Grad:      22.0 mmHg AV Mean Grad:      13.5 mmHg LVOT Vmax:         115.00 cm/s LVOT Vmean:        84.900 cm/s LVOT VTI:          0.172 m LVOT/AV VTI ratio: 0.46  AORTA Ao Root diam: 3.30 cm Ao Asc diam:  4.20 cm MITRAL VALVE MV Area (PHT): 5.70 cm     SHUNTS MV Decel Time: 133 msec     Systemic VTI:  0.17 m MV E velocity: 126.00 cm/s  Systemic Diam: 2.00 cm Phineas Inches Electronically signed by Phineas Inches Signature Date/Time: 05/04/2022/10:58:25 AM    Final    DG CHEST PORT 1 VIEW  Result Date: 05/04/2022 CLINICAL DATA:  76 year old male with history of shortness of breath, cough and congestion. EXAM: PORTABLE CHEST 1 VIEW COMPARISON:  Chest x-ray 11/29/2008. FINDINGS: Lung volumes are low. Patchy multifocal areas of interstitial prominence and airspace consolidation are noted throughout the lungs bilaterally (left-greater-than-right), most confluent at the left lung base. Small left pleural effusion. No right pleural effusion. No pneumothorax. No evidence of pulmonary edema. Heart  size appears borderline enlarged. The patient is rotated to the left on today's exam, resulting in distortion of the mediastinal contours and reduced diagnostic sensitivity and specificity for mediastinal pathology. Atherosclerotic calcifications in the thoracic aorta. Status post median sternotomy for CABG. IMPRESSION: 1. The appearance the chest is most concerning for severe multilobar bilateral bronchopneumonia, most confluent in the left lower lobe. 2. Small left parapneumonic pleural effusion. 3. Aortic atherosclerosis. Electronically Signed   By: Vinnie Langton M.D.   On: 05/04/2022 07:20   CT ABDOMEN PELVIS WO CONTRAST  Result Date: 05/02/2022 CLINICAL DATA:  Abdominal pain EXAM: CT ABDOMEN AND PELVIS WITHOUT CONTRAST TECHNIQUE: Multidetector CT imaging of the abdomen and pelvis was performed following the standard protocol without IV contrast. RADIATION DOSE REDUCTION: This exam was performed according to the departmental dose-optimization program which includes automated exposure control, adjustment of the mA and/or kV according to patient size and/or use of iterative reconstruction technique. COMPARISON:  CTA abdomen/pelvis dated 04/30/2022 FINDINGS: Motion degraded images. Lower chest: Small bilateral pleural effusions, new. Associated bilateral lower lobe opacities, likely atelectasis. Hepatobiliary: Unenhanced liver is unremarkable. Suspected gallbladder sludge versus noncalcified gallstones (series 3/image 24), without associated inflammatory changes. No intrahepatic or extrahepatic ductal dilatation. Pancreas: Acute pancreatitis with progressive edema and inflammatory change involving the pancreatic tail (series 3/image 22). Progressive peripancreatic inflammatory fluid/stranding along the jejunal mesentery and right retroperitoneum. No walled-off necrosis/pseudocyst. Spleen: Within normal limits.  Mild perisplenic fluid, new. Adrenals/Urinary Tract: Adrenal glands are within normal limits.  Residual contrast in the bilateral kidneys, raising concern for contrast induced nephropathy. Bilateral nonobstructing lower pole renal calculi, measuring up to 4 mm on the left. No hydronephrosis. Excretory contrast in the bladder. Bladder is thick-walled although underdistended. Stomach/Bowel: Stomach is notable for a moderate to large hiatal hernia with fluid within  the hernia, increased. No evidence of bowel obstruction. Appendix is within normal limits (series 3/image 61). Mild left colonic diverticulosis, without evidence of diverticulitis. Vascular/Lymphatic: No evidence of abdominal aortic aneurysm. Atherosclerotic calcifications of the abdominal aorta and branch vessels. No suspicious abdominopelvic lymphadenopathy. Reproductive: Prostatomegaly. Other: Progressive abdominal ascites, related to acute pancreatitis, as described above. No pelvic ascites. Prominent fat in the left inguinal canal (series 3/image 88), unchanged. Musculoskeletal: Mild superior endplate compression fracture deformity at T12 with Schmorl's node deformity. Mild to moderate compression fracture deformities at L1 and L2. These are unchanged from the prior and likely chronic. IMPRESSION: Progressive acute pancreatitis, as described above. No walled-off necrosis/pseudocyst. Residual contrast in the bilateral kidneys and excretory contrast in the bladder, raising concern for contrast induced nephropathy. Additional stable ancillary findings as above. Electronically Signed   By: Julian Hy M.D.   On: 05/02/2022 22:53   US Abdomen Limited RUQ (LIVER/GB)  Result Date: 04/29/2022 CLINICAL DATA:  Pancreatitis EXAM: ULTRASOUND ABDOMEN LIMITED RIGHT UPPER QUADRANT COMPARISON:  04/20/2022 FINDINGS: Gallbladder: Multiple mobile gallstones are present the gallbladder, largest 1.6 cm. Borderline gallbladder wall thickening and 0.3 cm. Sonographic Murphy's sign absent. Common bile duct: Diameter: 0.5 cm Liver: No focal lesion identified.  Within normal limits in parenchymal echogenicity. Portal vein is patent on color Doppler imaging with normal direction of blood flow towards the liver. Other: None. IMPRESSION: 1. Cholelithiasis. Borderline gallbladder wall thickening. Sonographic Murphy's sign absent. 2. No directly visualized choledocholithiasis. No biliary dilatation. Electronically Signed   By: Van Clines M.D.   On: 04/14/2022 18:18   CT Angio Chest/Abd/Pel for Dissection W and/or Wo Contrast  Result Date: 04/08/2022 CLINICAL DATA:  Abdominal pain. History of abdominal aortic aneurysm. EXAM: CT ANGIOGRAPHY CHEST, ABDOMEN AND PELVIS TECHNIQUE: Non-contrast CT of the chest was initially obtained. Multidetector CT imaging through the chest, abdomen and pelvis was performed using the standard protocol during bolus administration of intravenous contrast. Multiplanar reconstructed images and MIPs were obtained and reviewed to evaluate the vascular anatomy. RADIATION DOSE REDUCTION: This exam was performed according to the departmental dose-optimization program which includes automated exposure control, adjustment of the mA and/or kV according to patient size and/or use of iterative reconstruction technique. CONTRAST:  166m OMNIPAQUE IOHEXOL 350 MG/ML SOLN COMPARISON:  None Available. FINDINGS: CTA CHEST FINDINGS Cardiovascular:  No intramural hematoma on noncontrast imaging. Aortic atherosclerosis. Tortuous descending thoracic aorta. Mild ascending aortic aneurysm, including at 4.2 cm on 37/6 and coronal image 53. Ulcerative plaque in the descending thoracic aorta. Mild cardiomegaly with prior median sternotomy for CABG. No central pulmonary embolism, on this non-dedicated study. Mediastinum/Nodes: No mediastinal or hilar adenopathy. A large hiatal hernia, with greater than 1/2 of the stomach positioned in the lower chest. The upper esophagus is moderately dilated with fluid level within on 30/8. Edema adjacent the intrathoracic stomach  including on 101/8 likely tracks from the abdominal process. Lungs/Pleura: Trace right pleural fluid. 3 mm nodule along the right minor fissure is most likely a subpleural lymph node on 80/9. Musculoskeletal: No acute osseous abnormality. Remote anterior left rib fractures. Moderate T12 compression deformity without ventral canal encroachment. Review of the MIP images confirms the above findings. CTA ABDOMEN AND PELVIS FINDINGS VASCULAR Aorta: Atherosclerotic irregularity within. Maximal caliber infrarenal aorta of 3.1 x 2.7 cm. Celiac: Widely patent. SMA: Widely patent with atherosclerotic irregularity within. Renals: Atherosclerotic irregularity within both renal arteries, without significant stenosis. IMA: Patent Inflow: No significant stenosis. Veins: Not well evaluated Review of the MIP images confirms the  above findings. NON-VASCULAR Hepatobiliary: Normal liver. Normal gallbladder, without biliary ductal dilatation. Pancreas: Moderate peripancreatic edema is relatively diffuse including on 140/8. No evidence of pancreatic necrosis or duct dilatation. No well-circumscribed peripancreatic fluid collection. Spleen: Normal in size, without focal abnormality. Adrenals/Urinary Tract: Normal adrenal glands. Normal kidneys, without hydronephrosis. Normal urinary bladder. Stomach/Bowel: Edema also identified about the intraabdominal stomach and proximal duodenum. Scattered colonic diverticula. Normal terminal ileum and appendix. Normal small bowel. Lymphatic: No abdominopelvic adenopathy. Reproductive: Mild prostatomegaly. Other: Small fat containing left inguinal hernia. No significant free fluid. No free intraperitoneal air. Musculoskeletal: Osteopenia. L1 and L2 mild-to-moderate compression deformities. Review of the MIP images confirms the above findings. IMPRESSION: 1. Moderate non complicated pancreatitis. 2. Ascending aortic aneurysm on the order of 4.2 cm. Recommend annual imaging followup by CTA or MRA. This  recommendation follows 2010 ACCF/AHA/AATS/ACR/ASA/SCA/SCAI/SIR/STS/SVM Guidelines for the Diagnosis and Management of Patients with Thoracic Aortic Disease. Circulation. 2010; 121: C623-J628. Aortic aneurysm NOS (ICD10-I71.9) 3. Large hiatal hernia. Esophageal air fluid level suggests dysmotility or gastroesophageal reflux. 4. Edema adjacent the thoracoabdominal stomach and duodenum is likely secondary to pancreatitis. 5. Tiny right pleural effusion. 6. Osteopenia with thoracolumbar compression deformities. Electronically Signed   By: Abigail Miyamoto M.D.   On: 04/09/2022 15:00    Microbiology: Recent Results (from the past 240 hour(s))  Urine Culture     Status: None   Collection Time: 05/02/22 12:41 PM   Specimen: Urine, Clean Catch  Result Value Ref Range Status   Specimen Description URINE, CLEAN CATCH  Final   Special Requests NONE  Final   Culture   Final    NO GROWTH Performed at [facility], [place] [address], [place], [place] [address]    Report Status 05/03/2022 FINAL  Final  MRSA Next Gen by PCR, Nasal     Status: None   Collection Time: 05/04/22  7:28 AM   Specimen: Nasal Mucosa; Nasal Swab  Result Value Ref Range Status   MRSA by PCR Next Gen NOT DETECTED NOT DETECTED Final    Comment: (NOTE) The GeneXpert MRSA Assay (FDA approved for NASAL specimens only), is one component of a comprehensive MRSA colonization surveillance program. It is not intended to diagnose MRSA infection nor to guide or monitor treatment for MRSA infections. Test performance is not FDA approved in patients less than 14 years old. Performed at [facility], [place] [address], [place], [place] [address]   Culture, blood (Routine X 2) w Reflex to ID Panel     Status: None   Collection Time: 05/04/22  8:39 AM   Specimen: BLOOD RIGHT FOREARM  Result Value Ref Range Status   Specimen Description BLOOD RIGHT FOREARM  Final   Special Requests   Final    BOTTLES DRAWN AEROBIC AND ANAEROBIC Blood  Culture adequate volume   Culture   Final    NO GROWTH 5 DAYS Performed at [facility], [place] [address], [place], [place] [address]    Report Status 05/09/2022 FINAL  Final  Culture, blood (Routine X 2) w Reflex to ID Panel     Status: None   Collection Time: 05/04/22  8:49 AM   Specimen: BLOOD RIGHT WRIST  Result Value Ref Range Status   Specimen Description BLOOD RIGHT WRIST  Final   Special Requests   Final    BOTTLES DRAWN AEROBIC AND ANAEROBIC Blood Culture adequate volume   Culture   Final    NO GROWTH 5 DAYS Performed at  [facility], [place] [address], [place], [place] [address]    Report Status 05/09/2022 FINAL  Final  SARS Coronavirus 2 by RT PCR (hospital order, performed in [facility] hospital lab) *cepheid single result test* Anterior Nasal Swab     Status: None   Collection Time: 05/06/22  7:41 AM   Specimen: Anterior Nasal Swab  Result Value Ref Range Status   SARS Coronavirus 2 by RT PCR NEGATIVE NEGATIVE Final    Comment: (NOTE) SARS-CoV-2 target nucleic acids are NOT DETECTED.  The SARS-CoV-2 RNA is generally detectable in upper and lower respiratory specimens during the acute phase of infection. The lowest concentration of SARS-CoV-2 viral copies this assay can detect is 250 copies / mL. A negative result does not preclude SARS-CoV-2 infection and should not be used as the sole basis for treatment or other patient management decisions.  A negative result may occur with improper specimen collection / handling, submission of specimen other than nasopharyngeal swab, presence of viral mutation(s) within the areas targeted by this assay, and inadequate number of viral copies (<250 copies / mL). A negative result must be combined with clinical observations, patient history, and epidemiological information.  Fact Sheet for Patients:   https://www.patel.info/  Fact Sheet for Healthcare  Providers: https://hall.com/  This test is not yet approved or  cleared by the [place] FDA and has been authorized for detection and/or diagnosis of SARS-CoV-2 by FDA under an Emergency Use Authorization (EUA).  This EUA will remain in effect (meaning this test can be used) for the duration of the COVID-19 declaration under Section 564(b)(1) of the Act, 21 U.S.C. section 360bbb-3(b)(1), unless the authorization is terminated or revoked sooner.  Performed at [facility], [place] [address] [place], [place] [address]   Culture, blood (Routine X 2) w Reflex to ID Panel     Status: None (Preliminary result)   Collection Time: 05/06/22  8:50 AM   Specimen: BLOOD LEFT HAND  Result Value Ref Range Status   Specimen Description BLOOD LEFT HAND  Final   Special Requests   Final    BOTTLES DRAWN AEROBIC AND ANAEROBIC Blood Culture adequate volume   Culture   Final    NO GROWTH 4 DAYS Performed at [facility], [place] [address], [place], [place] [address]    Report Status PENDING  Incomplete  Culture, blood (Routine X 2) w Reflex to ID Panel     Status: None (Preliminary result)   Collection Time: 05/06/22  8:50 AM   Specimen: BLOOD  Result Value Ref Range Status   Specimen Description BLOOD RIGHT ANTECUBITAL  Final   Special Requests   Final    BOTTLES DRAWN AEROBIC AND ANAEROBIC Blood Culture adequate volume   Culture   Final    NO GROWTH 4 DAYS Performed at [facility], [place] [address], [place], [place] [address]    Report Status PENDING  Incomplete  Body fluid culture w Gram Stain     Status: None (Preliminary result)   Collection Time: 05/08/22 12:39 PM   Specimen: Lung, Right; Pleural Fluid  Result Value Ref Range Status   Specimen Description PLEURAL  Final   Special Requests RIGHT LUNG  Final   Gram Stain   Final    ABUNDANT WBC PRESENT, PREDOMINANTLY PMN GRAM NEGATIVE RODS CYTOSPIN SMEAR    Culture   Final    NO GROWTH 2  DAYS Performed at [facility], [place]  [address] [place], [place] [address]    Report Status PENDING  Incomplete    Time spent: 35 minutes  Signed: Nita Sells, MD 05-21-22

## 2022-06-02 NOTE — Progress Notes (Signed)
Increased WOB, 2 mg dilaudid given by family request

## 2022-06-02 NOTE — Progress Notes (Signed)
Nutrition Brief Note  Chart reviewed. Pt has transitioned to comfort care.  No further nutrition interventions planned at this time.  Please re-consult as needed.    Kate Anisha Starliper, MS, RD, LDN Inpatient Clinical Dietitian Please see AMiON for contact information.  

## 2022-06-02 NOTE — Plan of Care (Signed)
  Problem: Education: Goal: Knowledge of General Education information will improve Description: Including pain rating scale, medication(s)/side effects and non-pharmacologic comfort measures Outcome: Not Progressing   Problem: Health Behavior/Discharge Planning: Goal: Ability to manage health-related needs will improve Outcome: Not Progressing   Problem: Clinical Measurements: Goal: Diagnostic test results will improve Outcome: Not Progressing Goal: Respiratory complications will improve Outcome: Not Progressing   Problem: Activity: Goal: Risk for activity intolerance will decrease Outcome: Not Progressing   Problem: Nutrition: Goal: Adequate nutrition will be maintained Outcome: Not Progressing   Problem: Clinical Measurements: Goal: Ability to maintain clinical measurements within normal limits will improve Outcome: Progressing Goal: Will remain free from infection Outcome: Progressing Goal: Cardiovascular complication will be avoided Outcome: Progressing   Problem: Coping: Goal: Level of anxiety will decrease Outcome: Progressing   Problem: Elimination: Goal: Will not experience complications related to bowel motility Outcome: Progressing Goal: Will not experience complications related to urinary retention Outcome: Progressing   Problem: Pain Managment: Goal: General experience of comfort will improve Outcome: Progressing   Problem: Safety: Goal: Ability to remain free from injury will improve Outcome: Progressing   Problem: Skin Integrity: Goal: Risk for impaired skin integrity will decrease Outcome: Progressing

## 2022-06-02 NOTE — Progress Notes (Signed)
Increased WOB, 2 mg bolus dilaudid given, WOB eased

## 2022-06-02 NOTE — Progress Notes (Signed)
Tacey Ruiz NP in room checking on patient, increased patient dilaudid drip to 1.0 mg per hour with a small bolus. Remains available to call for any needs

## 2022-06-02 NOTE — Progress Notes (Addendum)
   Palliative Medicine Inpatient Follow Up Note   HPI: Russell Hebert is a 76 y.o. male with medical history significant of parkinsonian syndrome, GERD, hypertension, CAD s/p CABG, history of fall in December resulting in lumbar spine fracture, presented to ED with a complaint of back pain.  (+) Pancreatitis. Newly identified R sided empyema. Palliative care was asked to get involved to further address goals of care in the setting of failure to thrive.    Today's Discussion 06/02/2022  *Please note that this is a verbal dictation therefore any spelling or grammatical errors are due to the "[place] One" system interpretation.  Chart reviewed inclusive of vital signs, progress notes, laboratory results, and diagnostic images.   Overnight, Russell Hebert was made comfort care by Dr. Verlon Au after he was able to speak to family.   I met with night RN, Russell Hebert this morning. We repositioned the patient.   Reviewed that Russell Hebert has been on 0.$RemoveB'5mg'lAyyDQsg$ /hr of dilaudid and has not required any titration overnight. A bolus was provided x1 prior to weaning off of the facemask.  I was able to speak to Russell Hurry, RN about plan for the day.   No family present at bedside during my assessment though I plan to call daughter, Russell Hebert this morning.   Provided  "Gone From My Site" booklet. __________________________________ Addendum  Stop by patient's room this afternoon to educate patient's son-in-law on death and dying process.  Patient appears to be more mottled and has a decrease in respiratory rate.  __________________________________ Addendum Two  I went to patient's room again later this afternoon to speak with patient's son and daughter and provide them an update.  Questions and concerns addressed and palliative support was provided.  Additional time: 30  Objective Assessment: Vital Signs Vitals:   05/09/22 1700 05/09/22 2058  BP:    Pulse: (!) 116 100  Resp: (!) 32 (!) 23  Temp:    SpO2:       Intake/Output Summary (Last 24 hours) at 2022/06/02 0715 Last data filed at 05/09/2022 1900 Gross per 24 hour  Intake 1132 ml  Output 500 ml  Net 632 ml   Last Weight  Most recent update: 05/09/2022  3:06 AM    Weight  72.3 kg (159 lb 6.3 oz)            Gen:  [name] in NAD HEENT: Dry mucous membranes CV: Irregular rate and rhythm PULM: On RA, breathing even and nonlabored ABD: soft/nontender EXT: Cool, BLE mottling Neuro: Opens eyes, somnolent  SUMMARY OF RECOMMENDATIONS   DNAR/DNI  Appreciate Dr. Verlon Au speaking with patients family and shifting to comfort care  Dilaudid gtt for symptom management  Anticipate in hospital death  Ongoing palliative support  Billing based on MDM: High --> Controlled substance review and titration at end of life ______________________________________________________________________________________ [place] Team Team Cell Phone: [phone] Please utilize secure chat with additional questions, if there is no response within 30 minutes please call the above phone number  Palliative Medicine Team providers are available by phone from 7am to 7pm daily and can be reached through the team cell phone.  Should this patient require assistance outside of these hours, please call the patient's attending physician.

## 2022-06-02 NOTE — Progress Notes (Signed)
Patient with increased work of breathing, bolus '1mg'$  dilaudid given, patient with ease of breathing after bolus

## 2022-06-02 NOTE — Progress Notes (Signed)
This chaplain responded to the RN page for prayer with the Pt. and family at the Pt. time of death. The chaplain received permission from the family to use the Pt. preferred name "Russell Hebert" during the prayer and blessing.    The chaplain understands through story telling, reflective listening, and making hand prints together, the Pt. visited [place] four times in the last three years. The Pt loved to laugh.    The chaplain offered F/U spiritual care to the family as needed.  Chaplain Sallyanne Kuster [phone]

## 2022-06-02 DEATH — deceased
